# Patient Record
Sex: Male | Born: 1950 | Race: Black or African American | Hispanic: No | Smoking: Current every day smoker
Health system: Southern US, Community
[De-identification: ages and names within clinical notes are randomized; demographics above are authoritative.]

## PROBLEM LIST (undated history)

## (undated) DIAGNOSIS — R778 Other specified abnormalities of plasma proteins: Secondary | ICD-10-CM

## (undated) DIAGNOSIS — N400 Enlarged prostate without lower urinary tract symptoms: Secondary | ICD-10-CM

## (undated) DIAGNOSIS — R195 Other fecal abnormalities: Secondary | ICD-10-CM

## (undated) DIAGNOSIS — D638 Anemia in other chronic diseases classified elsewhere: Secondary | ICD-10-CM

## (undated) DIAGNOSIS — F1410 Cocaine abuse, uncomplicated: Secondary | ICD-10-CM

## (undated) DIAGNOSIS — E559 Vitamin D deficiency, unspecified: Secondary | ICD-10-CM

## (undated) DIAGNOSIS — I10 Essential (primary) hypertension: Secondary | ICD-10-CM

## (undated) DIAGNOSIS — I6340 Cerebral infarction due to embolism of unspecified cerebral artery: Secondary | ICD-10-CM

## (undated) DIAGNOSIS — N189 Chronic kidney disease, unspecified: Secondary | ICD-10-CM

## (undated) DIAGNOSIS — I609 Nontraumatic subarachnoid hemorrhage, unspecified: Secondary | ICD-10-CM

## (undated) DIAGNOSIS — R7989 Other specified abnormal findings of blood chemistry: Secondary | ICD-10-CM

## (undated) DIAGNOSIS — E876 Hypokalemia: Secondary | ICD-10-CM

## (undated) DIAGNOSIS — R1310 Dysphagia, unspecified: Secondary | ICD-10-CM

## (undated) DIAGNOSIS — I639 Cerebral infarction, unspecified: Secondary | ICD-10-CM

## (undated) DIAGNOSIS — D696 Thrombocytopenia, unspecified: Secondary | ICD-10-CM

## (undated) DIAGNOSIS — R768 Other specified abnormal immunological findings in serum: Secondary | ICD-10-CM

## (undated) DIAGNOSIS — K2270 Barrett's esophagus without dysplasia: Secondary | ICD-10-CM

## (undated) DIAGNOSIS — I6783 Posterior reversible encephalopathy syndrome: Secondary | ICD-10-CM

## (undated) HISTORY — DX: Barrett's esophagus without dysplasia: K22.70

---

## 2015-01-24 ENCOUNTER — Inpatient Hospital Stay (HOSPITAL_COMMUNITY): Payer: Medicaid Other

## 2015-01-24 ENCOUNTER — Encounter (HOSPITAL_COMMUNITY): Payer: Self-pay | Admitting: Emergency Medicine

## 2015-01-24 ENCOUNTER — Inpatient Hospital Stay (HOSPITAL_COMMUNITY)
Admission: EM | Admit: 2015-01-24 | Discharge: 2015-01-29 | DRG: 280 | Disposition: A | Discharge status: Home / Self Care | Payer: Medicaid Other | Attending: Internal Medicine | Admitting: Internal Medicine

## 2015-01-24 ENCOUNTER — Other Ambulatory Visit (HOSPITAL_COMMUNITY): Payer: Self-pay

## 2015-01-24 ENCOUNTER — Emergency Department (HOSPITAL_COMMUNITY): Payer: Medicaid Other

## 2015-01-24 DIAGNOSIS — E876 Hypokalemia: Secondary | ICD-10-CM | POA: Diagnosis present

## 2015-01-24 DIAGNOSIS — I517 Cardiomegaly: Secondary | ICD-10-CM | POA: Diagnosis present

## 2015-01-24 DIAGNOSIS — K852 Alcohol induced acute pancreatitis without necrosis or infection: Secondary | ICD-10-CM | POA: Diagnosis present

## 2015-01-24 DIAGNOSIS — R4182 Altered mental status, unspecified: Secondary | ICD-10-CM

## 2015-01-24 DIAGNOSIS — R1013 Epigastric pain: Secondary | ICD-10-CM

## 2015-01-24 DIAGNOSIS — D696 Thrombocytopenia, unspecified: Secondary | ICD-10-CM

## 2015-01-24 DIAGNOSIS — N179 Acute kidney failure, unspecified: Secondary | ICD-10-CM | POA: Diagnosis present

## 2015-01-24 DIAGNOSIS — R079 Chest pain, unspecified: Secondary | ICD-10-CM

## 2015-01-24 DIAGNOSIS — R634 Abnormal weight loss: Secondary | ICD-10-CM

## 2015-01-24 DIAGNOSIS — K920 Hematemesis: Secondary | ICD-10-CM | POA: Diagnosis not present

## 2015-01-24 DIAGNOSIS — F17200 Nicotine dependence, unspecified, uncomplicated: Secondary | ICD-10-CM

## 2015-01-24 DIAGNOSIS — N400 Enlarged prostate without lower urinary tract symptoms: Secondary | ICD-10-CM | POA: Diagnosis present

## 2015-01-24 DIAGNOSIS — F1410 Cocaine abuse, uncomplicated: Secondary | ICD-10-CM

## 2015-01-24 DIAGNOSIS — I214 Non-ST elevation (NSTEMI) myocardial infarction: Principal | ICD-10-CM | POA: Diagnosis present

## 2015-01-24 DIAGNOSIS — K5900 Constipation, unspecified: Secondary | ICD-10-CM

## 2015-01-24 DIAGNOSIS — R000 Tachycardia, unspecified: Secondary | ICD-10-CM

## 2015-01-24 DIAGNOSIS — E43 Unspecified severe protein-calorie malnutrition: Secondary | ICD-10-CM | POA: Diagnosis present

## 2015-01-24 DIAGNOSIS — F17210 Nicotine dependence, cigarettes, uncomplicated: Secondary | ICD-10-CM | POA: Diagnosis present

## 2015-01-24 DIAGNOSIS — R112 Nausea with vomiting, unspecified: Secondary | ICD-10-CM

## 2015-01-24 DIAGNOSIS — R109 Unspecified abdominal pain: Secondary | ICD-10-CM | POA: Diagnosis present

## 2015-01-24 DIAGNOSIS — Z7982 Long term (current) use of aspirin: Secondary | ICD-10-CM

## 2015-01-24 DIAGNOSIS — N184 Chronic kidney disease, stage 4 (severe): Secondary | ICD-10-CM

## 2015-01-24 DIAGNOSIS — R5383 Other fatigue: Secondary | ICD-10-CM

## 2015-01-24 DIAGNOSIS — I161 Hypertensive emergency: Secondary | ICD-10-CM | POA: Diagnosis present

## 2015-01-24 DIAGNOSIS — E559 Vitamin D deficiency, unspecified: Secondary | ICD-10-CM | POA: Diagnosis present

## 2015-01-24 DIAGNOSIS — D631 Anemia in chronic kidney disease: Secondary | ICD-10-CM | POA: Diagnosis present

## 2015-01-24 DIAGNOSIS — F1010 Alcohol abuse, uncomplicated: Secondary | ICD-10-CM | POA: Diagnosis present

## 2015-01-24 DIAGNOSIS — R51 Headache: Secondary | ICD-10-CM

## 2015-01-24 DIAGNOSIS — R55 Syncope and collapse: Secondary | ICD-10-CM | POA: Diagnosis present

## 2015-01-24 DIAGNOSIS — D6959 Other secondary thrombocytopenia: Secondary | ICD-10-CM | POA: Diagnosis present

## 2015-01-24 DIAGNOSIS — K859 Acute pancreatitis, unspecified: Secondary | ICD-10-CM | POA: Diagnosis present

## 2015-01-24 DIAGNOSIS — R7989 Other specified abnormal findings of blood chemistry: Secondary | ICD-10-CM

## 2015-01-24 DIAGNOSIS — R519 Headache, unspecified: Secondary | ICD-10-CM | POA: Diagnosis present

## 2015-01-24 DIAGNOSIS — Z720 Tobacco use: Secondary | ICD-10-CM | POA: Diagnosis present

## 2015-01-24 DIAGNOSIS — I10 Essential (primary) hypertension: Secondary | ICD-10-CM

## 2015-01-24 DIAGNOSIS — B1920 Unspecified viral hepatitis C without hepatic coma: Secondary | ICD-10-CM | POA: Diagnosis present

## 2015-01-24 DIAGNOSIS — R0602 Shortness of breath: Secondary | ICD-10-CM | POA: Insufficient documentation

## 2015-01-24 DIAGNOSIS — Z681 Body mass index (BMI) 19 or less, adult: Secondary | ICD-10-CM | POA: Diagnosis not present

## 2015-01-24 DIAGNOSIS — R195 Other fecal abnormalities: Secondary | ICD-10-CM | POA: Insufficient documentation

## 2015-01-24 DIAGNOSIS — R778 Other specified abnormalities of plasma proteins: Secondary | ICD-10-CM | POA: Diagnosis present

## 2015-01-24 DIAGNOSIS — I129 Hypertensive chronic kidney disease with stage 1 through stage 4 chronic kidney disease, or unspecified chronic kidney disease: Secondary | ICD-10-CM

## 2015-01-24 HISTORY — DX: Benign prostatic hyperplasia without lower urinary tract symptoms: N40.0

## 2015-01-24 HISTORY — DX: Cocaine abuse, uncomplicated: F14.10

## 2015-01-24 HISTORY — DX: Chronic kidney disease, unspecified: N18.9

## 2015-01-24 HISTORY — DX: Other specified abnormal findings of blood chemistry: R79.89

## 2015-01-24 HISTORY — DX: Thrombocytopenia, unspecified: D69.6

## 2015-01-24 HISTORY — DX: Other specified abnormalities of plasma proteins: R77.8

## 2015-01-24 HISTORY — DX: Hypokalemia: E87.6

## 2015-01-24 HISTORY — DX: Vitamin D deficiency, unspecified: E55.9

## 2015-01-24 HISTORY — DX: Other specified abnormal immunological findings in serum: R76.8

## 2015-01-24 HISTORY — DX: Essential (primary) hypertension: I10

## 2015-01-24 LAB — URINALYSIS, ROUTINE W REFLEX MICROSCOPIC
Bilirubin Urine: NEGATIVE
Glucose, UA: NEGATIVE mg/dL
Ketones, ur: NEGATIVE mg/dL
NITRITE: NEGATIVE
SPECIFIC GRAVITY, URINE: 1.01 (ref 1.005–1.030)
Urobilinogen, UA: 1 mg/dL (ref 0.0–1.0)
pH: 5.5 (ref 5.0–8.0)

## 2015-01-24 LAB — CBC
HEMATOCRIT: 38.9 % — AB (ref 39.0–52.0)
Hemoglobin: 13.6 g/dL (ref 13.0–17.0)
MCH: 29.5 pg (ref 26.0–34.0)
MCHC: 35 g/dL (ref 30.0–36.0)
MCV: 84.4 fL (ref 78.0–100.0)
Platelets: 148 10*3/uL — ABNORMAL LOW (ref 150–400)
RBC: 4.61 MIL/uL (ref 4.22–5.81)
RDW: 12.7 % (ref 11.5–15.5)
WBC: 6.3 10*3/uL (ref 4.0–10.5)

## 2015-01-24 LAB — BASIC METABOLIC PANEL
Anion gap: 11 (ref 5–15)
BUN: 40 mg/dL — ABNORMAL HIGH (ref 6–20)
CALCIUM: 9.2 mg/dL (ref 8.9–10.3)
CO2: 22 mmol/L (ref 22–32)
CREATININE: 3.84 mg/dL — AB (ref 0.61–1.24)
Chloride: 104 mmol/L (ref 101–111)
GFR calc Af Amer: 18 mL/min — ABNORMAL LOW (ref >60)
GFR calc non Af Amer: 15 mL/min — ABNORMAL LOW (ref >60)
Glucose, Bld: 122 mg/dL — ABNORMAL HIGH (ref 70–99)
Potassium: 4 mmol/L (ref 3.5–5.1)
Sodium: 137 mmol/L (ref 135–145)

## 2015-01-24 LAB — COMPREHENSIVE METABOLIC PANEL
ALBUMIN: 3.8 g/dL (ref 3.5–5.0)
ALK PHOS: 56 U/L (ref 38–126)
ALT: 22 U/L (ref 17–63)
AST: 29 U/L (ref 15–41)
Anion gap: 13 (ref 5–15)
BILIRUBIN TOTAL: 1.3 mg/dL — AB (ref 0.3–1.2)
BUN: 46 mg/dL — ABNORMAL HIGH (ref 6–20)
CO2: 22 mmol/L (ref 22–32)
Calcium: 9.4 mg/dL (ref 8.9–10.3)
Chloride: 100 mmol/L — ABNORMAL LOW (ref 101–111)
Creatinine, Ser: 4.17 mg/dL — ABNORMAL HIGH (ref 0.61–1.24)
GFR calc Af Amer: 16 mL/min — ABNORMAL LOW (ref >60)
GFR calc non Af Amer: 14 mL/min — ABNORMAL LOW (ref >60)
Glucose, Bld: 109 mg/dL — ABNORMAL HIGH (ref 70–99)
POTASSIUM: 3 mmol/L — AB (ref 3.5–5.1)
SODIUM: 135 mmol/L (ref 135–145)
Total Protein: 7.4 g/dL (ref 6.5–8.1)

## 2015-01-24 LAB — URINE MICROSCOPIC-ADD ON

## 2015-01-24 LAB — TROPONIN I
TROPONIN I: 1.35 ng/mL — AB (ref <0.031)
Troponin I: 0.07 ng/mL — ABNORMAL HIGH (ref <0.031)
Troponin I: 1.98 ng/mL (ref <0.031)

## 2015-01-24 LAB — CBG MONITORING, ED: Glucose-Capillary: 98 mg/dL (ref 70–99)

## 2015-01-24 LAB — RAPID URINE DRUG SCREEN, HOSP PERFORMED
AMPHETAMINES: NOT DETECTED
BARBITURATES: NOT DETECTED
Benzodiazepines: NOT DETECTED
Cocaine: POSITIVE — AB
OPIATES: NOT DETECTED
TETRAHYDROCANNABINOL: NOT DETECTED

## 2015-01-24 LAB — ETHANOL: Alcohol, Ethyl (B): <5 mg/dL (ref <5)

## 2015-01-24 LAB — SODIUM, URINE, RANDOM: Sodium, Ur: 47 mmol/L

## 2015-01-24 LAB — LIPASE, BLOOD: Lipase: 173 U/L — ABNORMAL HIGH (ref 22–51)

## 2015-01-24 LAB — PHOSPHORUS: PHOSPHORUS: 4 mg/dL (ref 2.5–4.6)

## 2015-01-24 LAB — LACTIC ACID, PLASMA: LACTIC ACID, VENOUS: 1 mmol/L (ref 0.5–2.0)

## 2015-01-24 LAB — I-STAT TROPONIN, ED: TROPONIN I, POC: 0.04 ng/mL (ref 0.00–0.08)

## 2015-01-24 LAB — PROTIME-INR
INR: 0.98 (ref 0.00–1.49)
Prothrombin Time: 13.1 seconds (ref 11.6–15.2)

## 2015-01-24 LAB — MRSA PCR SCREENING: MRSA BY PCR: NEGATIVE

## 2015-01-24 LAB — MAGNESIUM: MAGNESIUM: 2.1 mg/dL (ref 1.7–2.4)

## 2015-01-24 LAB — CREATININE, URINE, RANDOM: Creatinine, Urine: 67.24 mg/dL

## 2015-01-24 MED ORDER — ONDANSETRON HCL 4 MG/2ML IJ SOLN
4.0000 mg | Freq: Four times a day (QID) | INTRAMUSCULAR | Status: DC | PRN
  Administered 2015-01-25 – 2015-01-26 (×2): 4 mg via INTRAVENOUS
  Filled 2015-01-24 (×2): qty 2

## 2015-01-24 MED ORDER — LORAZEPAM 2 MG/ML IJ SOLN: 1.0000 mg | Freq: Four times a day (QID) | INTRAMUSCULAR | Status: AC | PRN

## 2015-01-24 MED ORDER — ASPIRIN EC 325 MG PO TBEC
325.0000 mg | DELAYED_RELEASE_TABLET | Freq: Once | ORAL | Status: AC
  Administered 2015-01-24: 325 mg via ORAL
  Filled 2015-01-24: qty 1

## 2015-01-24 MED ORDER — POTASSIUM CHLORIDE 10 MEQ/100ML IV SOLN
10.0000 meq | INTRAVENOUS | Status: DC
  Administered 2015-01-24 (×2): 10 meq via INTRAVENOUS
  Filled 2015-01-24 (×2): qty 100

## 2015-01-24 MED ORDER — ASPIRIN EC 81 MG PO TBEC: 81.0000 mg | DELAYED_RELEASE_TABLET | Freq: Every day | ORAL | Status: DC

## 2015-01-24 MED ORDER — THIAMINE HCL 100 MG/ML IJ SOLN: 100.0000 mg | Freq: Every day | INTRAMUSCULAR | Status: DC

## 2015-01-24 MED ORDER — HEPARIN (PORCINE) IN NACL 100-0.45 UNIT/ML-% IJ SOLN
500.0000 [IU]/h | INTRAMUSCULAR | Status: DC
  Administered 2015-01-24: 600 [IU]/h via INTRAVENOUS
  Filled 2015-01-24 (×2): qty 250

## 2015-01-24 MED ORDER — HEPARIN BOLUS VIA INFUSION
2500.0000 [IU] | Freq: Once | INTRAVENOUS | Status: AC
  Administered 2015-01-24: 2500 [IU] via INTRAVENOUS
  Filled 2015-01-24: qty 2500

## 2015-01-24 MED ORDER — SODIUM CHLORIDE 0.9 % IV SOLN
INTRAVENOUS | Status: DC
  Administered 2015-01-24 – 2015-01-25 (×2): via INTRAVENOUS

## 2015-01-24 MED ORDER — ADULT MULTIVITAMIN W/MINERALS CH
1.0000 | ORAL_TABLET | Freq: Every day | ORAL | Status: DC
  Administered 2015-01-24 – 2015-01-29 (×6): 1 via ORAL
  Filled 2015-01-24 (×6): qty 1

## 2015-01-24 MED ORDER — NICARDIPINE HCL IN NACL 20-0.86 MG/200ML-% IV SOLN
3.0000 mg/h | INTRAVENOUS | Status: DC
Start: 2015-01-24 — End: 2015-01-24
  Filled 2015-01-24: qty 200

## 2015-01-24 MED ORDER — ACETAMINOPHEN 325 MG PO TABS
650.0000 mg | ORAL_TABLET | Freq: Four times a day (QID) | ORAL | Status: DC | PRN
  Administered 2015-01-24 – 2015-01-26 (×2): 650 mg via ORAL
  Filled 2015-01-24 (×2): qty 2

## 2015-01-24 MED ORDER — NITROGLYCERIN IN D5W 200-5 MCG/ML-% IV SOLN
10.0000 ug/min | INTRAVENOUS | Status: DC
  Administered 2015-01-24: 10 ug/min via INTRAVENOUS
  Filled 2015-01-24: qty 250

## 2015-01-24 MED ORDER — IOHEXOL 300 MG/ML  SOLN
25.0000 mL | INTRAMUSCULAR | Status: AC
  Administered 2015-01-24: 25 mL via ORAL

## 2015-01-24 MED ORDER — MORPHINE SULFATE 2 MG/ML IJ SOLN
1.0000 mg | INTRAMUSCULAR | Status: DC | PRN
  Administered 2015-01-24 – 2015-01-25 (×2): 1 mg via INTRAVENOUS
  Filled 2015-01-24 (×2): qty 1

## 2015-01-24 MED ORDER — LORAZEPAM 1 MG PO TABS: 1.0000 mg | ORAL_TABLET | Freq: Four times a day (QID) | ORAL | Status: AC | PRN

## 2015-01-24 MED ORDER — NICOTINE 21 MG/24HR TD PT24
21.0000 mg | MEDICATED_PATCH | Freq: Every day | TRANSDERMAL | Status: DC
  Administered 2015-01-24 – 2015-01-29 (×6): 21 mg via TRANSDERMAL
  Filled 2015-01-24 (×6): qty 1

## 2015-01-24 MED ORDER — FOLIC ACID 1 MG PO TABS
1.0000 mg | ORAL_TABLET | Freq: Every day | ORAL | Status: DC
  Administered 2015-01-24 – 2015-01-29 (×6): 1 mg via ORAL
  Filled 2015-01-24 (×6): qty 1

## 2015-01-24 MED ORDER — NITROGLYCERIN 0.4 MG SL SUBL: 0.4000 mg | SUBLINGUAL_TABLET | SUBLINGUAL | Status: DC | PRN

## 2015-01-24 MED ORDER — POTASSIUM CHLORIDE CRYS ER 20 MEQ PO TBCR
40.0000 meq | EXTENDED_RELEASE_TABLET | Freq: Once | ORAL | Status: AC
  Administered 2015-01-24: 40 meq via ORAL
  Filled 2015-01-24: qty 2

## 2015-01-24 MED ORDER — CARVEDILOL 6.25 MG PO TABS
6.2500 mg | ORAL_TABLET | Freq: Two times a day (BID) | ORAL | Status: DC
  Administered 2015-01-24 – 2015-01-25 (×2): 6.25 mg via ORAL
  Filled 2015-01-24 (×2): qty 1

## 2015-01-24 MED ORDER — ASPIRIN EC 81 MG PO TBEC
81.0000 mg | DELAYED_RELEASE_TABLET | Freq: Every day | ORAL | Status: DC
  Administered 2015-01-25 – 2015-01-29 (×5): 81 mg via ORAL
  Filled 2015-01-24 (×5): qty 1

## 2015-01-24 MED ORDER — NITROGLYCERIN IN D5W 200-5 MCG/ML-% IV SOLN
0.0000 ug/min | INTRAVENOUS | Status: DC
  Administered 2015-01-24: 50 ug/min via INTRAVENOUS
  Administered 2015-01-24: 20 ug/min via INTRAVENOUS
  Administered 2015-01-24: 10 ug/min via INTRAVENOUS
  Administered 2015-01-24: 5 ug/min via INTRAVENOUS
  Administered 2015-01-24: 40 ug/min via INTRAVENOUS
  Administered 2015-01-24: 30 ug/min via INTRAVENOUS
  Administered 2015-01-24: 25 ug/min via INTRAVENOUS
  Administered 2015-01-24: 15 ug/min via INTRAVENOUS
  Administered 2015-01-25: 130 ug/min via INTRAVENOUS
  Administered 2015-01-25: 110 ug/min via INTRAVENOUS
  Administered 2015-01-25: 140 ug/min via INTRAVENOUS
  Administered 2015-01-25: 120 ug/min via INTRAVENOUS
  Administered 2015-01-25: 110 ug/min via INTRAVENOUS
  Administered 2015-01-25: 100 ug/min via INTRAVENOUS
  Administered 2015-01-25: 130 ug/min via INTRAVENOUS
  Administered 2015-01-26: 100 ug/min via INTRAVENOUS
  Filled 2015-01-24 (×6): qty 250

## 2015-01-24 MED ORDER — HEPARIN SODIUM (PORCINE) 5000 UNIT/ML IJ SOLN: 5000.0000 [IU] | Freq: Three times a day (TID) | INTRAMUSCULAR | Status: DC

## 2015-01-24 MED ORDER — VITAMIN B-1 100 MG PO TABS
100.0000 mg | ORAL_TABLET | Freq: Every day | ORAL | Status: DC
  Administered 2015-01-24 – 2015-01-29 (×6): 100 mg via ORAL
  Filled 2015-01-24 (×6): qty 1

## 2015-01-24 MED ORDER — IOHEXOL 300 MG/ML  SOLN: 25.0000 mL | INTRAMUSCULAR | Status: DC

## 2015-01-24 MED ORDER — HYDRALAZINE HCL 20 MG/ML IJ SOLN
10.0000 mg | Freq: Once | INTRAMUSCULAR | Status: AC
  Administered 2015-01-24: 10 mg via INTRAVENOUS
  Filled 2015-01-24: qty 1

## 2015-01-24 MED ORDER — ACETAMINOPHEN 325 MG PO TABS
650.0000 mg | ORAL_TABLET | Freq: Once | ORAL | Status: AC
  Administered 2015-01-24: 650 mg via ORAL
  Filled 2015-01-24: qty 2

## 2015-01-24 MED ORDER — SODIUM CHLORIDE 0.9 % IJ SOLN
3.0000 mL | Freq: Two times a day (BID) | INTRAMUSCULAR | Status: DC
  Administered 2015-01-24 – 2015-01-29 (×10): 3 mL via INTRAVENOUS

## 2015-01-24 MED ORDER — SODIUM CHLORIDE 0.9 % IV SOLN
Freq: Once | INTRAVENOUS | Status: AC
  Administered 2015-01-24: 09:00:00 via INTRAVENOUS

## 2015-01-24 MED ORDER — PANTOPRAZOLE SODIUM 40 MG IV SOLR
40.0000 mg | INTRAVENOUS | Status: DC
  Administered 2015-01-24 – 2015-01-25 (×2): 40 mg via INTRAVENOUS
  Filled 2015-01-24 (×2): qty 40

## 2015-01-24 MED ORDER — HYDRALAZINE HCL 20 MG/ML IJ SOLN
20.0000 mg | Freq: Once | INTRAMUSCULAR | Status: AC
  Administered 2015-01-24: 20 mg via INTRAVENOUS
  Filled 2015-01-24: qty 1

## 2015-01-24 NOTE — Progress Notes (Signed)
Checked on patient to evaluate his clinical status. He denies any chest pain currently. He does report a headache. BP still significantly elevated at 196/121. He appears stable overall. His second troponin came back elevated at 1.35.   Plan: - Start heparin gtt for NSTEMI - f/u 3rd troponin at 9 PM - Start Coreg 6.25 mg BID. Patient cocaine + in urine but adamantly states he last used cocaine 4 days ago. Will use nonselective agent.  - Titrate nitro gtt Q10 mins for goal systolic BP 165-175  Rich Numberarly Rivet, MD IMTS PGY-1 Pager: [phone]

## 2015-01-24 NOTE — Progress Notes (Addendum)
Patient's BP=  206/127  , after nitro drip started at 5 mcg then increased to 10 mcg.  I notified Dr. Beckie Saltsivet.  She stated that she doesn't want blood pressure to drop too quickly but ok to increase iv nitro again.  I then increased nitro to 15 mcg will continue to monitor

## 2015-01-24 NOTE — H&P (Signed)
Date: 01/24/2015               Patient Name:  Lee Swanson R Gal MRN: [id number]  DOB: Jun 08, 1951 Age / Sex: 64 y.o., male   PCP: No primary care provider on file.         Medical Service: Internal Medicine Teaching Service         Attending Physician: Dr. Burns SpainElizabeth A Butcher, MD    First Contact: Dr. Beckie Saltsivet  Pager: [phone]  Second Contact: Dr. Mikey BussingHoffman Pager: [phone]       After Hours (After 5p/  First Contact Pager: [phone]  weekends / holidays): Second Contact Pager: [phone]   Chief Complaint: Chest pain, Abdominal pain, weight loss   History of Present Illness:  64 y.o no pmh presents with multiple symptoms. He c/o left chest pain since 5 am ? if at rest/exertion per pt.  CP feels like tightening 10/10 was on the right now radiated to left side "around his heart". Pain is 10/10 associated with nausea, vomiting, sob, denies sweating.  He is unsure of what makes it worse ? If eating makes it worse, nothing makes CP betters.  Overall CP has occurred x 1-2 months though worsened since this am 5 am.  He also states he blacked out for a short period this am while trying to get up to use the bathroom.  He denies hitting his head and this was not witnessed.  He has also had intermittent sob at rest.  He also reports RUQ, epigastric 10/10 abdominal pain "tightening" sensation nothing makes worse and he has not tried any medication. Pain worsening today but has been ongoing x 1-2 months. Associated with nausea, vomiting, decreased po intake x 4 days.  He also reports 10/10 h/a that was bitemporal now frontal. H/a is throbbing and assoc. With blurry vision in left eye.    He has not seen a doctor in 40 years.  BP in the ED was 233/145. He was given Hydralazine 30 mg total and started on NTG gtt in ED around 8:06 at 10 mcg.     Meds: Current Facility-Administered Medications  Medication Dose Route Frequency Provider Last Rate Last Dose  . 0.9 %  sodium chloride infusion   Intravenous Once  Burns SpainElizabeth A Butcher, MD      . acetaminophen (TYLENOL) tablet 650 mg  650 mg Oral Q6H PRN Annett Gularacy N McLean, MD      . Melene Muller[START ON 01/25/2015] aspirin EC tablet 81 mg  81 mg Oral Daily Annett Gularacy N McLean, MD      . morphine 2 MG/ML injection 1 mg  1 mg Intravenous Q4H PRN Annett Gularacy N McLean, MD      . nitroGLYCERIN (NITROSTAT) SL tablet 0.4 mg  0.4 mg Sublingual Q5 min PRN Annett Gularacy N McLean, MD      . potassium chloride 10 mEq in 100 mL IVPB  10 mEq Intravenous Q1 Hr x 5 Annett Gularacy N McLean, MD 100 mL/hr at 01/24/15 0903 10 mEq at 01/24/15 [id number]   No current outpatient prescriptions on file.    Allergies: Allergies as of 01/24/2015  . (No Known Allergies)   Past Medical History  Diagnosis Date  . Patient denies medical problems    Past Surgical History  Procedure Laterality Date  . Denies     No family history on file. History   Social History  . Marital Status: Unknown    Spouse Name: N/A  . Number of Children: N/A  . Years of Education:  N/A   Occupational History  . Not on file.   Social History Main Topics  . Smoking status: Current Every Day Smoker -- 0.50 packs/day  . Smokeless tobacco: Not on file  . Alcohol Use: Yes  . Drug Use: No  . Sexual Activity: Not on file   Other Topics Concern  . 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 If at rest/exertion per pt Pulm: +sob @rest  Abd/GU: +RUQ, +epigastric ab pain, +n/v, +increased urination q5-10 min,  +nocturia, +weak stream, +not completely emptying bladder, denies GU blood Ext: denies lower ext edema  Neuro: +h/a, +syncope and fall w/o hitting head    Physical Exam: Blood pressure 195/117, pulse 95, temperature 97.9 F (36.6 C), temperature source Oral, resp. rate 18, height 5\' 6"  (1.676 m), SpO2 100 %.  VS pm exa, 107, 100% RA, 26, 134/92 (102) Vitals reviewed. General: resting in bed, mild distress 2/2 pain, NAD, thin/cachetic HEENT: PERRL b/l right pupil 3 mm smaller than left 4 mm, no scleral icterus, dry mucous membranes, poor dentition Cardiac: ST, no rubs, murmurs or gallops Pulm: clear to auscultation bilaterally, no wheezes, rales, or rhonchi Abd: soft, mild to mod ttp RUQ, epigastric, nondistended, BS present Ext: warm and well perfused, no pedal edema Neuro: alert and oriented X3, cranial nerves II-XII grossly intact, moving all 4 extremities  Skin: Xerosis generalized   Lab results: Basic Metabolic Panel:  Recent Labs  16/06/9604/09/16 0433 01/24/15 0812  NA 135  --   K 3.0*  --   CL 100*  --   CO2 22  --   GLUCOSE 109*  --   BUN 46*  --   CREATININE 4.17*  --   CALCIUM 9.4  --   MG  --  2.1  PHOS  --  4.0   Liver Function Tests:  Recent Labs  01/24/15 0433  AST 29  ALT 22  ALKPHOS 56  BILITOT 1.3*  PROT 7.4  ALBUMIN 3.8    Recent Labs  01/24/15 0812  LIPASE 173*   No results for input(s): AMMONIA in the last 72 hours. CBC:  Recent Labs  01/24/15 0433  WBC 6.3  HGB 13.6  HCT 38.9*  MCV 84.4  PLT 148*   Cardiac Enzymes:  Recent Labs  01/24/15 0812  TROPONINI 0.07*   CBG:  Recent Labs  01/24/15 0426  GLUCAP 98   Coagulation:  Recent Labs  01/24/15 0812  LABPROT 13.1  INR 0.98   Urine Drug Screen: Drugs of Abuse     Component Value Date/Time   LABOPIA NONE DETECTED 01/24/2015 0528   COCAINSCRNUR POSITIVE* 01/24/2015 0528   LABBENZ NONE DETECTED 01/24/2015 0528   AMPHETMU NONE DETECTED 01/24/2015 0528   THCU NONE  DETECTED 01/24/2015 0528   LABBARB NONE DETECTED 01/24/2015 0528    Alcohol Level:  Recent  Labs  01/24/15 0812  ETH <5   Urinalysis:  Recent Labs  01/24/15 0758  COLORURINE YELLOW  LABSPEC 1.010  PHURINE 5.5  GLUCOSEU NEGATIVE  HGBUR SMALL*  BILIRUBINUR NEGATIVE  KETONESUR NEGATIVE  PROTEINUR >300*  UROBILINOGEN 1.0  NITRITE NEGATIVE  LEUKOCYTESUR TRACE*   Misc. Labs: HIV antibody   Imaging results:  Ct Abdomen Pelvis Wo Contrast  01/24/2015   CLINICAL DATA:  Hypertension and shortness of breath. Frequent urination.  EXAM: CT ABDOMEN AND PELVIS WITHOUT CONTRAST  TECHNIQUE: Multidetector CT imaging of the abdomen and pelvis was performed following the standard protocol without IV contrast.  COMPARISON:  None.  FINDINGS: BODY WALL: No contributory findings.  LOWER CHEST: Proximal left circulation coronary artery atherosclerosis.  ABDOMEN/PELVIS:  Liver: No focal abnormality.  Biliary: No evidence of biliary obstruction or stone.  Pancreas: Unremarkable.  Spleen: Unremarkable.  Adrenals: Unremarkable.  Kidneys and ureters: No hydronephrosis or stone. Fetal appearing lobulation of the bilateral kidneys. An exophytic 3 cm presumed cyst noted from the upper pole left kidney.  Bladder: Mild circumferential wall thickening, accentuated by underdistention.  Reproductive: Marked symmetric enlargement of the prostate gland, 6.5 cm in transverse dimension  Bowel: No obstruction. Negative appendix. Mild colonic diverticulosis.  Retroperitoneum: No mass or adenopathy.  Peritoneum: No ascites or pneumoperitoneum.  Vascular: No acute abnormality.  OSSEOUS: No acute abnormalities. L4-5 and L5-S1 degenerative disc narrowing and spurring.  IMPRESSION: 1. No acute intra-abdominal findings. 2. Marked prostatomegaly. 3. Colonic diverticulosis. 4. Coronary atherosclerosis.   Electronically Signed   By: Marnee Spring M.D.   On: 01/24/2015 08:29   Dg Chest 2 View  01/24/2015   CLINICAL DATA:  Shortness of  Breath  EXAM: CHEST  2 VIEW  COMPARISON:  None.  FINDINGS: There is no edema or consolidation. The heart size and pulmonary vascularity are normal. No adenopathy. There is evidence of old rib trauma involving the posterior left eighth rib.  IMPRESSION: No edema or consolidation.   Electronically Signed   By: Bretta Bang III M.D.   On: 01/24/2015 07:16    Other results: EKG: NSR, nl intervals, nl axis, biatrial enlargement, TWI/flattening 2, 3, AVF, ST changes V3, V5, V6, LVH  #Hypertensive emergency -with AKI, LVH on EKG likely 2/2 uncontrolled HTN chronically. BP on admission 233/145.  Less likely AD as SBP not >20 in b/l arms.   -trend Trop x 3, pending echo  -Given Hydralazine 30 mg total in ED.  Nicardipine gtt ordered 1st hour do not lower sbp below 200 then w/in 23 more hours do not lower sbp <165. While seen in ED BP was 134/92 (102) repeat sbp 160. Will hold on Nicardipine gtt for now do not want to acutely lower BP too much. Called by RN in ED unable to take pt to SDU on Nicardipine will need to go to ICU unless NTG.  Will change to NTG gtt to slowly titrate to SBP 165-175 w/in 1st 23 hours.   -Admit to SDU on tele, neuro checks   #Chest pain/Sinus tachycardia  -r/o ACS, AD (SBP difference not >20 169/101 right arm, 186/114 left arm), also cocaine +. Associated with n/v/sob. CXR neg -trop x 3 -prn EKG for chest pain -prn NTG, prn morphine 1 q4, given Aspirin 324 x 1 continue 81 in am -prn O2 -cards consulted for recs   #AKI (acute kidney injury) on CKD (chronic kidney disease) stage 4, GFR 16 ml/min (borderline stage 5) -Cr on admission 4.17.  ? Etiology related to  uncontrolled HTN, decreased po vs enlarged prostate (BPH), cocaine use. Fena consistent with intrinsic renal pathology ATN/AIN, GN, proteinuria 300 on UA -check urine lytes, check renal function panel in am, check PTH, phos today ,vitamin D, UA, bladder scan -strict i/o, daily weights -NS 100 cc/hr  -consider renal  consult in am if kidneys not improving   #presyncope vs syncope -? If due to decrease po intake vs uncontrolled HTN, will w/u for CHF -check orthostatics (pt unable to perform standing due to weakness but lying and sitting values no orthostasis), echo pending -consider PT/OT when able   #Acute epigastric and RUQ abdominal pain/nausea/vomiting -?etiology could be gastritis vs MSK due to n/v vs pancreatitis.  Check lactic acid neg, lipase to w/u pancreatitis (173), ethanol level neg.   -CT Ab/pelvis-diverticulosis, markedly enlarged prostate -prn Zofran, start Protonix 40 mg iv   #H/a -likely 2/2 HTN emergency vs NTG gtt started in ED -Neuro checks -prn Tylenol  #Weight loss/fatigue -per pt normally weights 140-150 and lost weight over last 1-2 months, also had decreased po x 4 days.   -CT with enlarged prostate, diverticulosis -will need outpatient referral to GI for colonoscopy, check tsh am  #Thrombocytopenia -mild, ? Etiology could be due to chronic alcohol abuse -trend CBC, check PT, INR  #Cocaine abuse/Tobacco abuse/? Alcohol abuse/Lack of insurance and PCP -social work consulted. Pt is not being honest about cocaine use. He states he drinks 1-2 beers daily. Will start CIWA empirically -RN smoking cessation  -check ethanol level -Nicotine 21 patch  #constipation -monitor last stool yesterday, consider adding Miralax if po intake picks up and still no BM.  BM's are hard per patient  #Enlarged prostate  -consider addition of Flomax when able to tolerate po  #F/E/N -NS 100  -Hypokalemia-replaced with 5 runs 10 meq, check Mag -NPO will advance as tolerated     #DVT px  -hep, scds  Dispo: Disposition is deferred at this time, awaiting improvement of current medical problems. Anticipated discharge in approximately 2-4 day(s).   The patient does not have a current PCP (No primary care provider on file.) and does need to establish follow-up appointment after discharge  with PCP  The patient does have transportation limitations that hinder transportation to clinic appointments.  Signed: Annett Gula, MD 01/24/2015, 10:02 AM

## 2015-01-24 NOTE — Consult Note (Signed)
CARDIOLOGY CONSULT NOTE   Patient ID: Lee Swanson MRN: [id number] DOB/AGE: 1950-12-01 64 y.o.  Admit Date: 01/24/2015  Primary Physician: No primary care provider on file.  Primary Cardiologist     Lee Swanson   Lee Swanson   Clinical Summary Lee Swanson is a 89 y.o.male who was admitted with a long list of medical problems. These include chest pain. However he also has abdominal pain and pain that radiates to his head. He has severe renal disease. He has significant weight loss and marked fatigue. There is very slight troponin elevation. He has marked fatigue. In addition he had marked hypertension on admission.   No Known Allergies  Medications Scheduled Medications: . [START ON 01/25/2015] aspirin EC  81 mg Oral Daily  . folic acid  1 mg Oral Daily  . heparin  5,000 Units Subcutaneous 3 times per day  . multivitamin with minerals  1 tablet Oral Daily  . nicotine  21 mg Transdermal Daily  . pantoprazole (PROTONIX) IV  40 mg Intravenous Q24H  . potassium chloride  10 mEq Intravenous Q1 Hr x 5  . sodium chloride  3 mL Intravenous Q12H  . thiamine  100 mg Oral Daily   Or  . thiamine  100 mg Intravenous Daily     Infusions: . sodium chloride    . nitroGLYCERIN       PRN Medications:  acetaminophen, LORazepam **OR** LORazepam, morphine injection, nitroGLYCERIN, ondansetron (ZOFRAN) IV   Past Medical History  Diagnosis Date  . Patient denies medical problems     Past Surgical History  Procedure Laterality Date  . Denies      Family history:  There is no significant family history of coronary disease.  Social History Lee Swanson reports that he has been smoking.  He does not have any smokeless tobacco history on file. Lee Swanson reports that he drinks alcohol.  Review of Systems Complete review of systems are found to be negative unless outlined in H&P above.  Physical Examination Blood pressure 188/114, pulse 90, temperature 97.9 F (36.6 C), temperature  source Oral, resp. rate 17, height 5\' 6"  (1.676 m), SpO2 99 %.  Intake/Output Summary (Last 24 hours) at 01/24/15 1125 Last data filed at 01/24/15 0641  Gross per 24 hour  Intake      0 ml  Output    225 ml  Net   -225 ml   The patient is oriented to person time and place. Affect is normal. He looks cachectic. He says he is too fatigued to walk across the room. Head is atraumatic. There is mild jugular venous distention. Lungs reveal a few scattered rhonchi. Cardiac exam reveals S1 and S2. Abdomen is soft. There is no peripheral edema. Neurologic is grossly intact.   Prior Cardiac Testing/Procedures  Lab Results  Basic Metabolic Panel:  Recent Labs Lab 01/24/15 0433 01/24/15 0812  NA 135  --   K 3.0*  --   CL 100*  --   CO2 22  --   GLUCOSE 109*  --   BUN 46*  --   CREATININE 4.17*  --   CALCIUM 9.4  --   MG  --  2.1  PHOS  --  4.0    Liver Function Tests:  Recent Labs Lab 01/24/15 0433  AST 29  ALT 22  ALKPHOS 56  BILITOT 1.3*  PROT 7.4  ALBUMIN 3.8    CBC:  Recent Labs Lab 01/24/15 0433  WBC 6.3  HGB 13.6  HCT 38.9*  MCV 84.4  PLT 148*    Cardiac Enzymes:  Recent Labs Lab 01/24/15 0812  TROPONINI 0.07*    BNP: Invalid input(s): POCBNP   Radiology: Ct Abdomen Pelvis Wo Contrast  01/24/2015   CLINICAL DATA:  Hypertension and shortness of breath. Frequent urination.  EXAM: CT ABDOMEN AND PELVIS WITHOUT CONTRAST  TECHNIQUE: Multidetector CT imaging of the abdomen and pelvis was performed following the standard protocol without IV contrast.  COMPARISON:  None.  FINDINGS: BODY WALL: No contributory findings.  LOWER CHEST: Proximal left circulation coronary artery atherosclerosis.  ABDOMEN/PELVIS:  Liver: No focal abnormality.  Biliary: No evidence of biliary obstruction or stone.  Pancreas: Unremarkable.  Spleen: Unremarkable.  Adrenals: Unremarkable.  Kidneys and ureters: No hydronephrosis or stone. Fetal appearing lobulation of the bilateral  kidneys. An exophytic 3 cm presumed cyst noted from the upper pole left kidney.  Bladder: Mild circumferential wall thickening, accentuated by underdistention.  Reproductive: Marked symmetric enlargement of the prostate gland, 6.5 cm in transverse dimension  Bowel: No obstruction. Negative appendix. Mild colonic diverticulosis.  Retroperitoneum: No mass or adenopathy.  Peritoneum: No ascites or pneumoperitoneum.  Vascular: No acute abnormality.  OSSEOUS: No acute abnormalities. L4-5 and L5-S1 degenerative disc narrowing and spurring.  IMPRESSION: 1. No acute intra-abdominal findings. 2. Marked prostatomegaly. 3. Colonic diverticulosis. 4. Coronary atherosclerosis.   Electronically Signed   By: Marnee Spring M.D.   On: 01/24/2015 08:29   Dg Chest 2 View  01/24/2015   CLINICAL DATA:  Shortness of Breath  EXAM: CHEST  2 VIEW  COMPARISON:  None.  FINDINGS: There is no edema or consolidation. The heart size and pulmonary vascularity are normal. No adenopathy. There is evidence of old rib trauma involving the posterior left eighth rib.  IMPRESSION: No edema or consolidation.   Electronically Signed   By: Bretta Bang III M.D.   On: 01/24/2015 07:16     ECG:  I have reviewed the current EKGs. There are no old EKGs for comparison. There is increased voltage and ST changes consistent with LVH. One of the EKGs shows more marked ST depression. This is concerning for the possibility of ischemia.   Impression and Recommendations    Hypertensive emergency    Patient is receiving treatment in the hospital for his hypertension.    Hypokalemia     Potassium will need to be treated carefully.    AKI (acute kidney injury)     Patient has severe renal dysfunction. This may be due to hypertensive cardiovascular disease. This will limit his overall evaluation relative to his cardiac status.   CKD (chronic kidney disease) stage 4, GFR 15-29 ml/min    Thrombocytopenia    Cocaine abuse     Cocaine may be  playing a role with his chest pain and the probability of coronary disease. All attempts 8 to be made to stop his cocaine use.    LVH (left ventricular hypertrophy)    Abdominal pain   Nausea and vomiting   Weight loss     At this point, etiology of his abdominal symptoms is not clear. This will be evaluated by the primary team.        Tobacco abuse    Chest pain   Syncope   Elevated troponin      At this point it is very difficult to assess his chest pain. He tells me that he has pain that starts in his abdomen and radiates up through his chest to the top  of his head. There is very slight troponin elevation. He has severe renal insufficiency. At this time I would plan to try to assess all of his other medical problems following his cardiac status at the same time. 2-D echo will be helpful to assess LV function. If he has LV dysfunction, we will have to adjust his meds very carefully because of his renal insufficiency. He is not a candidate at this time for cardiac catheterization.  Jerral BonitoJeff Katz, MD 01/24/2015, 11:25 AM

## 2015-01-24 NOTE — ED Notes (Signed)
Phlebotomy at bedside.

## 2015-01-24 NOTE — Progress Notes (Addendum)
Patient's BP still elevated after nitro increased to 2920mcg/min I notified Dr. Beckie Saltsivet. I asked for prn medications to treat hypertension,  she stated she wanted to just continue going up on the IV nitro slowly every hour.  Will continue to monitor.

## 2015-01-24 NOTE — ED Notes (Signed)
Preformed orthostatic vital signs lying down and sitting up. Pt expressed that he could not stand up due to feeling too weak.

## 2015-01-24 NOTE — ED Notes (Signed)
Pt arrives via EMS with intermittent SHOB ongoing for the last 3 days, near syncope episode tonight. Fatigue, dry mouth, nausea, frequent urination, weight loss ongoing for a month. Hasn't seen a doctor in 40 years. Alert and oriented.

## 2015-01-24 NOTE — Progress Notes (Signed)
ANTICOAGULATION CONSULT NOTE - Initial Consult  Pharmacy Consult for Heparin Indication: chest pain/ACS  No Known Allergies  Patient Measurements: Height: 5\' 6"  (167.6 cm) Weight: 107 lb 9.6 oz (48.807 kg) IBW/kg (Calculated) : 63.8 Heparin Dosing Weight:   Vital Signs: BP: 196/121 mmHg (05/09 1840) Pulse Rate: 85 (05/09 1840)  Labs:  Recent Labs  01/24/15 0433 01/24/15 0812 01/24/15 1508  HGB 13.6  --   --   HCT 38.9*  --   --   PLT 148*  --   --   LABPROT  --  13.1  --   INR  --  0.98  --   CREATININE 4.17*  --   --   TROPONINI  --  0.07* 1.35*    Estimated Creatinine Clearance: 12.4 mL/min (by C-G formula based on Cr of 4.17).   Medical History: Past Medical History  Diagnosis Date  . Patient denies medical problems     Medications:  Scheduled:  . [START ON 01/25/2015] aspirin EC  81 mg Oral Daily  . carvedilol  6.25 mg Oral BID WC  . folic acid  1 mg Oral Daily  . heparin  5,000 Units Subcutaneous 3 times per day  . multivitamin with minerals  1 tablet Oral Daily  . nicotine  21 mg Transdermal Daily  . pantoprazole (PROTONIX) IV  40 mg Intravenous Q24H  . sodium chloride  3 mL Intravenous Q12H  . thiamine  100 mg Oral Daily   Or  . thiamine  100 mg Intravenous Daily    Assessment: 64yo male admitted with chest pain, N/V, SOB and hypertensive emergency.  He is to start Heparin for NSTEMI.  He has not seen a physician in 40y and is on no medications at home.  He is found to be CKD-4.  He is currently on Nitroglycerin drip and received Hydralazine IV x 2.  UDS (+)Cocaine Cr 4.17, LFTs wnl, Hg 13.6, pltc 148, INR 0.98  Goal of Therapy:  Heparin level 0.3-0.7 units/ml Monitor platelets by anticoagulation protocol: Yes   Plan:  Heparin 2500 units IV x 1, then 600 units/hr Check HL 8hr Daily HL, CBC Watch for s/s of bleeding.  Marisue HumbleKendra Keoshia Steinmetz, PharmD Clinical Pharmacist [facility]

## 2015-01-24 NOTE — ED Provider Notes (Signed)
CSN: [id number]     Arrival date & time 01/24/15  0404 History   First MD Initiated Contact with Patient 01/24/15 0411     Chief Complaint  Patient presents with  . Hypertension  . Shortness of Breath     (Consider location/radiation/quality/duration/timing/severity/associated sxs/prior Treatment) Patient is a 64 y.o. male presenting with vomiting.  Emesis Severity:  Moderate Duration:  4 days Timing:  Constant Quality:  Stomach contents Progression:  Worsening Chronicity:  New Relieved by:  Nothing Worsened by:  Nothing tried Ineffective treatments:  None tried Associated symptoms: abdominal pain and chills     History reviewed. No pertinent past medical history. History reviewed. No pertinent past surgical history. No family history on file. History  Substance Use Topics  . Smoking status: Current Every Day Smoker -- 0.50 packs/day  . Smokeless tobacco: Not on file  . Alcohol Use: Yes    Review of Systems  Constitutional: Positive for chills.  Gastrointestinal: Positive for vomiting and abdominal pain.  All other systems reviewed and are negative.     Allergies  Review of patient's allergies indicates no known allergies.  Home Medications   Prior to Admission medications   Not on File   BP 218/134 mmHg  Pulse 92  Temp(Src) 97.9 F (36.6 C) (Oral)  Resp 17  Ht _0  (1.676 m)  SpO2 100% Physical Exam  Constitutional: He is oriented to person, place, and time. He appears well-developed and well-nourished.  HENT:  Head: Normocephalic and atraumatic.  Eyes: Conjunctivae and EOM are normal.  Neck: Normal range of motion. Neck supple.  Cardiovascular: Normal rate, regular rhythm and normal heart sounds.   Pulmonary/Chest: Effort normal and breath sounds normal. No respiratory distress.  Abdominal: He exhibits no distension. There is generalized tenderness (mild). There is no rebound and no guarding.  Musculoskeletal: Normal range of motion.   Neurological: He is alert and oriented to person, place, and time.  Skin: Skin is warm and dry.  Vitals reviewed.   ED Course  Procedures (including critical care time) Labs Review Labs Reviewed  CBC - Abnormal; Notable for the following:    HCT 38.9 (*)    Platelets 148 (*)    All other components within normal limits  COMPREHENSIVE METABOLIC PANEL - Abnormal; Notable for the following:    Potassium 3.0 (*)    Chloride 100 (*)    Glucose, Bld 109 (*)    BUN 46 (*)    Creatinine, Ser 4.17 (*)    Total Bilirubin 1.3 (*)    GFR calc non Af Amer 14 (*)    GFR calc Af Amer 16 (*)    All other components within normal limits  URINE RAPID DRUG SCREEN (HOSP PERFORMED) - Abnormal; Notable for the following:    Cocaine POSITIVE (*)    All other components within normal limits  I-STAT TROPOININ, ED  CBG MONITORING, ED    Imaging Review Dg Chest 2 View  01/24/2015   CLINICAL DATA:  Shortness of Breath  EXAM: CHEST  2 VIEW  COMPARISON:  None.  FINDINGS: There is no edema or consolidation. The heart size and pulmonary vascularity are normal. No adenopathy. There is evidence of old rib trauma involving the posterior left eighth rib.  IMPRESSION: No edema or consolidation.   Electronically Signed   By: Lowella Grip III M.D.   On: 01/24/2015 07:16     EKG Interpretation   Date/Time:  Monday Jan 24 2015 04:12:45 EDT Ventricular Rate:  94 PR Interval:  149 QRS Duration: 94 QT Interval:  359 QTC Calculation: 449 R Axis:   49 Text Interpretation:  Sinus rhythm Biatrial enlargement LVH with secondary  repolarization abnormality No old tracing to compare Confirmed by Debby Freiberg [phone]) on 01/24/2015 7:19:47 AM     CRITICAL CARE Performed by: Debby Freiberg   Total critical care time: 45 min  Critical care time was exclusive of separately billable procedures and treating other patients.  Critical care was necessary to treat or prevent imminent or life-threatening  deterioration.  Critical care was time spent personally by me on the following activities: development of treatment plan with patient and/or surrogate as well as nursing, discussions with consultants, evaluation of patient's response to treatment, examination of patient, obtaining history from patient or surrogate, ordering and performing treatments and interventions, ordering and review of laboratory studies, ordering and review of radiographic studies, pulse oximetry and re-evaluation of patient's condition.  MDM   Final diagnoses:  Abdominal pain, acute    64 y.o. male without pertinent PMH presents with a myriad of symptoms, primarily 4 days of nausea, vomiting, near syncope, weight loss.  On arrival vitals and physical exam as above.  Wu with renal injury, unknown chronicity as pt has not seen a physician in 73 years.  UDS positive for cocaine.  CXR unremarkable.  Given hydralazine x 2 with mild improvement in BP.  Admitted for hypertensive emergency.  I have reviewed all laboratory and imaging studies if ordered as above  1. Abdominal pain, acute         Debby Freiberg, MD 01/24/15 [phone]

## 2015-01-25 ENCOUNTER — Inpatient Hospital Stay (HOSPITAL_COMMUNITY): Payer: Medicaid Other

## 2015-01-25 ENCOUNTER — Encounter (HOSPITAL_COMMUNITY): Payer: Self-pay | Admitting: General Practice

## 2015-01-25 DIAGNOSIS — R0789 Other chest pain: Secondary | ICD-10-CM

## 2015-01-25 DIAGNOSIS — R768 Other specified abnormal immunological findings in serum: Secondary | ICD-10-CM

## 2015-01-25 DIAGNOSIS — F17210 Nicotine dependence, cigarettes, uncomplicated: Secondary | ICD-10-CM

## 2015-01-25 DIAGNOSIS — K859 Acute pancreatitis, unspecified: Secondary | ICD-10-CM

## 2015-01-25 DIAGNOSIS — E43 Unspecified severe protein-calorie malnutrition: Secondary | ICD-10-CM | POA: Insufficient documentation

## 2015-01-25 DIAGNOSIS — H538 Other visual disturbances: Secondary | ICD-10-CM

## 2015-01-25 DIAGNOSIS — I517 Cardiomegaly: Secondary | ICD-10-CM

## 2015-01-25 HISTORY — DX: Other specified abnormal immunological findings in serum: R76.8

## 2015-01-25 LAB — CBC
HEMATOCRIT: 31.1 % — AB (ref 39.0–52.0)
HEMATOCRIT: 28.9 % — AB (ref 39.0–52.0)
HEMOGLOBIN: 10.1 g/dL — AB (ref 13.0–17.0)
Hemoglobin: 10.8 g/dL — ABNORMAL LOW (ref 13.0–17.0)
MCH: 29.4 pg (ref 26.0–34.0)
MCH: 29.4 pg (ref 26.0–34.0)
MCHC: 34.7 g/dL (ref 30.0–36.0)
MCHC: 34.9 g/dL (ref 30.0–36.0)
MCV: 84.7 fL (ref 78.0–100.0)
MCV: 84.3 fL (ref 78.0–100.0)
Platelets: 128 10*3/uL — ABNORMAL LOW (ref 150–400)
Platelets: 139 10*3/uL — ABNORMAL LOW (ref 150–400)
RBC: 3.67 MIL/uL — AB (ref 4.22–5.81)
RBC: 3.43 MIL/uL — ABNORMAL LOW (ref 4.22–5.81)
RDW: 12.8 % (ref 11.5–15.5)
RDW: 12.9 % (ref 11.5–15.5)
WBC: 6.1 10*3/uL (ref 4.0–10.5)
WBC: 7.2 10*3/uL (ref 4.0–10.5)

## 2015-01-25 LAB — LIPID PANEL
CHOLESTEROL: 148 mg/dL (ref 0–200)
HDL: 62 mg/dL (ref >40)
LDL CALC: 76 mg/dL (ref 0–99)
Total CHOL/HDL Ratio: 2.4 RATIO
Triglycerides: 49 mg/dL (ref <150)
VLDL: 10 mg/dL (ref 0–40)

## 2015-01-25 LAB — RENAL FUNCTION PANEL
ALBUMIN: 3.1 g/dL — AB (ref 3.5–5.0)
Anion gap: 7 (ref 5–15)
BUN: 40 mg/dL — AB (ref 6–20)
CO2: 21 mmol/L — ABNORMAL LOW (ref 22–32)
CREATININE: 3.72 mg/dL — AB (ref 0.61–1.24)
Calcium: 8.9 mg/dL (ref 8.9–10.3)
Chloride: 108 mmol/L (ref 101–111)
GFR calc Af Amer: 18 mL/min — ABNORMAL LOW (ref >60)
GFR calc non Af Amer: 16 mL/min — ABNORMAL LOW (ref >60)
GLUCOSE: 102 mg/dL — AB (ref 70–99)
POTASSIUM: 4.3 mmol/L (ref 3.5–5.1)
Phosphorus: 5 mg/dL — ABNORMAL HIGH (ref 2.5–4.6)
Sodium: 136 mmol/L (ref 135–145)

## 2015-01-25 LAB — HEPARIN LEVEL (UNFRACTIONATED)
HEPARIN UNFRACTIONATED: 0.6 [IU]/mL (ref 0.30–0.70)
Heparin Unfractionated: 0.32 IU/mL (ref 0.30–0.70)
Heparin Unfractionated: 0.59 IU/mL (ref 0.30–0.70)

## 2015-01-25 LAB — ABO/RH: ABO/RH(D): B POS

## 2015-01-25 LAB — TYPE AND SCREEN
ABO/RH(D): B POS
Antibody Screen: NEGATIVE

## 2015-01-25 LAB — TROPONIN I: Troponin I: 2.87 ng/mL (ref <0.031)

## 2015-01-25 LAB — TSH: TSH: 1.395 u[IU]/mL (ref 0.350–4.500)

## 2015-01-25 LAB — HIV ANTIBODY (ROUTINE TESTING W REFLEX): HIV SCREEN 4TH GENERATION: NONREACTIVE

## 2015-01-25 LAB — OCCULT BLOOD X 1 CARD TO LAB, STOOL: FECAL OCCULT BLD: POSITIVE — AB

## 2015-01-25 LAB — VITAMIN D 25 HYDROXY (VIT D DEFICIENCY, FRACTURES): Vit D, 25-Hydroxy: 13 ng/mL — ABNORMAL LOW (ref 30.0–100.0)

## 2015-01-25 MED ORDER — PANTOPRAZOLE SODIUM 40 MG IV SOLR
40.0000 mg | Freq: Two times a day (BID) | INTRAVENOUS | Status: DC
  Administered 2015-01-26 (×2): 40 mg via INTRAVENOUS
  Filled 2015-01-25 (×2): qty 40

## 2015-01-25 MED ORDER — BOOST / RESOURCE BREEZE PO LIQD
1.0000 | Freq: Three times a day (TID) | ORAL | Status: DC
  Administered 2015-01-25 – 2015-01-29 (×8): 1 via ORAL

## 2015-01-25 MED ORDER — AMLODIPINE BESYLATE 5 MG PO TABS
5.0000 mg | ORAL_TABLET | Freq: Every day | ORAL | Status: DC
  Administered 2015-01-25: 5 mg via ORAL
  Filled 2015-01-25: qty 1

## 2015-01-25 MED ORDER — VERAPAMIL HCL 80 MG PO TABS
80.0000 mg | ORAL_TABLET | Freq: Three times a day (TID) | ORAL | Status: DC
  Filled 2015-01-25: qty 1

## 2015-01-25 MED ORDER — CARVEDILOL 12.5 MG PO TABS
12.5000 mg | ORAL_TABLET | Freq: Two times a day (BID) | ORAL | Status: DC
  Administered 2015-01-25 – 2015-01-26 (×2): 12.5 mg via ORAL
  Filled 2015-01-25 (×3): qty 1

## 2015-01-25 MED ORDER — PROMETHAZINE HCL 25 MG/ML IJ SOLN
12.5000 mg | Freq: Once | INTRAMUSCULAR | Status: AC
  Administered 2015-01-25: 12.5 mg via INTRAVENOUS
  Filled 2015-01-25: qty 1

## 2015-01-25 MED ORDER — VERAPAMIL HCL 80 MG PO TABS
80.0000 mg | ORAL_TABLET | Freq: Three times a day (TID) | ORAL | Status: DC
  Administered 2015-01-25 – 2015-01-27 (×5): 80 mg via ORAL
  Filled 2015-01-25 (×9): qty 1

## 2015-01-25 MED ORDER — VITAMIN D (ERGOCALCIFEROL) 1.25 MG (50000 UNIT) PO CAPS
50000.0000 [IU] | ORAL_CAPSULE | ORAL | Status: DC
  Administered 2015-01-26: 50000 [IU] via ORAL
  Filled 2015-01-25: qty 1

## 2015-01-25 MED ORDER — HYDROMORPHONE HCL 1 MG/ML IJ SOLN: 0.5000 mg | INTRAMUSCULAR | Status: DC | PRN

## 2015-01-25 NOTE — Progress Notes (Signed)
Subjective:  No more chest pain. Would like some ice cream.  Objective:  Vital Signs in the last 24 hours: Temp:  [97.2 F (36.2 C)-98.1 F (36.7 C)] 97.6 F (36.4 C) (05/10 0801) Pulse Rate:  [58-107] 69 (05/10 0801) Resp:  [11-24] 16 (05/10 0801) BP: (160-223)/(99-156) 201/107 mmHg (05/10 0801) SpO2:  [90 %-100 %] 99 % (05/10 0801) Weight:  [107 lb 9.6 oz (48.807 kg)-109 lb 4.8 oz (49.578 kg)] 109 lb 4.8 oz (49.578 kg) (05/10 0334)  Intake/Output from previous day: 05/09 0701 - 05/10 0700 In: 1898.6 [I.V.:1898.6] Out: 1050 [Urine:1050]   Physical Exam: General: Thin, in no acute distress. Head:  Normocephalic and atraumatic. Lungs: Minimal scattered rhonchi. Heart: Normal S1 and S2.  No murmur, rubs or gallops.  Abdomen: soft, non-tender, positive bowel sounds. Extremities: No clubbing or cyanosis. No edema. Neurologic: Alert and oriented x 3.    Lab Results:  Recent Labs  01/24/15 0433 01/25/15 0346  WBC 6.3 6.1  HGB 13.6 10.8*  PLT 148* 128*    Recent Labs  01/24/15 1930 01/25/15 0346  NA 137 136  K 4.0 4.3  CL 104 108  CO2 22 21*  GLUCOSE 122* 102*  BUN 40* 40*  CREATININE 3.84* 3.72*    Recent Labs  01/24/15 1930 01/25/15 0346  TROPONINI 1.98* 2.87*   Hepatic Function Panel  Recent Labs  01/24/15 0433 01/25/15 0346  PROT 7.4  --   ALBUMIN 3.8 3.1*  AST 29  --   ALT 22  --   ALKPHOS 56  --   BILITOT 1.3*  --     Recent Labs  01/25/15 0346  CHOL 148   No results for input(s): PROTIME in the last 72 hours.  Imaging: Ct Abdomen Pelvis Wo Contrast  01/24/2015   CLINICAL DATA:  Hypertension and shortness of breath. Frequent urination.  EXAM: CT ABDOMEN AND PELVIS WITHOUT CONTRAST  TECHNIQUE: Multidetector CT imaging of the abdomen and pelvis was performed following the standard protocol without IV contrast.  COMPARISON:  None.  FINDINGS: BODY WALL: No contributory findings.  LOWER CHEST: Proximal left circulation coronary artery  atherosclerosis.  ABDOMEN/PELVIS:  Liver: No focal abnormality.  Biliary: No evidence of biliary obstruction or stone.  Pancreas: Unremarkable.  Spleen: Unremarkable.  Adrenals: Unremarkable.  Kidneys and ureters: No hydronephrosis or stone. Fetal appearing lobulation of the bilateral kidneys. An exophytic 3 cm presumed cyst noted from the upper pole left kidney.  Bladder: Mild circumferential wall thickening, accentuated by underdistention.  Reproductive: Marked symmetric enlargement of the prostate gland, 6.5 cm in transverse dimension  Bowel: No obstruction. Negative appendix. Mild colonic diverticulosis.  Retroperitoneum: No mass or adenopathy.  Peritoneum: No ascites or pneumoperitoneum.  Vascular: No acute abnormality.  OSSEOUS: No acute abnormalities. L4-5 and L5-S1 degenerative disc narrowing and spurring.  IMPRESSION: 1. No acute intra-abdominal findings. 2. Marked prostatomegaly. 3. Colonic diverticulosis. 4. Coronary atherosclerosis.   Electronically Signed   By: Marnee SpringJonathon  Watts M.D.   On: 01/24/2015 08:29   Dg Chest 2 View  01/24/2015   CLINICAL DATA:  Shortness of Breath  EXAM: CHEST  2 VIEW  COMPARISON:  None.  FINDINGS: There is no edema or consolidation. The heart size and pulmonary vascularity are normal. No adenopathy. There is evidence of old rib trauma involving the posterior left eighth rib.  IMPRESSION: No edema or consolidation.   Electronically Signed   By: Bretta BangWilliam  Woodruff III M.D.   On: 01/24/2015 07:16   [place] Renal  01/24/2015   CLINICAL DATA:  Acute renal failure  EXAM: RENAL / URINARY TRACT ULTRASOUND COMPLETE  COMPARISON:  None.  FINDINGS: Right Kidney:  Length: 9.4 cm. Appears echogenic with a lobular contour. No mass or hydronephrosis noted.  Left Kidney:  Length: 9.7 cm. Increased parenchymal echogenicity. Cyst within the upper pole measures 3.3 x 3.4 x 3.5 cm.  Bladder:  Appears normal for degree of bladder distention.  IMPRESSION: 1. Bilateral echogenic kidneys compatible with  chronic medical renal disease. 2. Left renal cyst.   Electronically Signed   By: Signa Kellaylor  Stroud M.D.   On: 01/24/2015 20:57    Telemetry: No adverse rhythms Personally viewed.   EKG:  SR, LVH, ST depression diffuse  Cardiac Studies:  ECHO P  Scheduled Meds: . aspirin EC  81 mg Oral Daily  . carvedilol  6.25 mg Oral BID WC  . folic acid  1 mg Oral Daily  . multivitamin with minerals  1 tablet Oral Daily  . nicotine  21 mg Transdermal Daily  . pantoprazole (PROTONIX) IV  40 mg Intravenous Q24H  . sodium chloride  3 mL Intravenous Q12H  . thiamine  100 mg Oral Daily   Continuous Infusions: . sodium chloride 75 mL/hr at 01/24/15 1422  . heparin 600 Units/hr (01/24/15 1956)  . nitroGLYCERIN 140 mcg/min (01/25/15 0705)   PRN Meds:.acetaminophen, LORazepam **OR** LORazepam, morphine injection, nitroGLYCERIN, ondansetron (ZOFRAN) IV  Assessment/Plan:  Principal Problem:   Hypertensive emergency Active Problems:   Hypokalemia   AKI (acute kidney injury)   CKD (chronic kidney disease) stage 4, GFR 15-29 ml/min   Thrombocytopenia   Cocaine abuse   LVH (left ventricular hypertrophy)   Abdominal pain   Nausea and vomiting   Weight loss   Tobacco abuse   Chest pain   Headache   Syncope   Elevated troponin   Enlarged prostate   1. Elevated troponin  - likely demand ischemia in the setting of severe hypertension  - could certainly have underlying CAD given risk factors (ST depression on ECG)  - at this time would focus on medical mgt - HTN control, and cocaine cessation. He is at high risk for sudden cardiac death with cocaine abuse.  - Consider continue Heparin IV over next 24 hours. Try to continue to decrease BP to reduce bleeding risk. Not cardiac cath candidate at this time.  2. Hypertensive emergency  - NTG drip is maxed  - Coreg is 6.25 BID. Recommend increase to 12.5 BID  - Recommend starting amlodipine  - Recommend starting hydralazine  - No ACE-I because of renal  function  - Renal u/s reviewed. Medical renal dz  - CT abd no abnormalities  - Heart size normal on CXR, await ECHO  3. Cocaine abuse  - has tried to quit in the past  - continue to encourage  4. CKD 4 - likely HTN related  Ilir Mahrt 01/25/2015, 8:52 AM

## 2015-01-25 NOTE — Consult Note (Signed)
[facility] Consult: 11:18 AM 01/26/2015  LOS: 2 days    Referring Provider: Dr Rogelia BogaButcher  Primary Care Physician:  None Primary Gastroenterologist:  Delena BaliUn asssigned     Reason for Consultation:  Scant hematemesis. Dysphagia. Weight loss.   HPI: Lee Swanson is a 64 y.o. male.  He has had no previous medical care.  Not taking any regular over-the-counter medications.  Smokes a pack of cigarettes daily.  ETOH abuse and polysubstance abuse.   He was admitted through the ED complaining of left chest pain, nausea, vomiting, dyspnea.  The chest pain, she describes as a tightness radiating around to the left chest,  has been occurring for 1-2 months. It is not typically exertional.  Patient has intermittent dyspnea at rest.  Short period where he blacked out while getting up from using the commode, fell and hit his head.  He was complaining of headache and blurry left vision In the ED his blood pressure was 233/145 and he was treated with hydralazine and started on nitroglycerin drip.  troponins elevated and attributed to demand ischemia due to cocaine and hypertension.  Echo planned. No cardiac cath planned.  Other new diagnoses include stage 4 CKD. A new dx of Hep C positive.    Patient admits to drinking 80oz (2 forties) beer daily. [place] is from [place] but moved to [place] and lives in a rooming house.  No living close relatives.  He works temp jobs which involve heavy lifting.  Tox screen is positive for cocaine, he admits to smoking crack.  Relays weight loss but can't tell me how much.   However, he's notched his belt in probably 6 inches in the last several months. No abdominal pain. For a long time, patient is vague and can't give me exact numbers, his stools have been small balls but there is no blood  in the stool.  He denies melena and blood per rectum at home.Marland Kitchen.  His stool is FOBT positive and received reported yesterday as dark  He describes dysphagia and either vomiting or regurgitation for at least a few months. Not really having nausea. Yesterday he spit up mostly clear material with small streaks of blood. The pH of this material was tested and was not ascitic, thus it was thought that this was sputum rather than gastric contents that he spit up.  Approximately 2 PM yesterday he had a single bowel movement, described as small, loose, black.   Past Medical History  Diagnosis Date  . Patient denies medical problems   . Chronic kidney disease     ckd  . Hypokalemia   . Thrombocytopenia   . Hypertension   . Hepatitis C antibody test positive     Past Surgical History  Procedure Laterality Date  . Denies      Prior to Admission medications   Not on File    Scheduled Meds: . aspirin EC  81 mg Oral Daily  . carvedilol  12.5 mg Oral BID WC  . feeding supplement (RESOURCE BREEZE)  1 Container Oral TID  BM  . folic acid  1 mg Oral Daily  . hydrALAZINE  25 mg Oral 3 times per day  . multivitamin with minerals  1 tablet Oral Daily  . nicotine  21 mg Transdermal Daily  . pantoprazole (PROTONIX) IV  40 mg Intravenous Q12H  . sodium chloride  3 mL Intravenous Q12H  . thiamine  100 mg Oral Daily  . verapamil  80 mg Oral 3 times per day  . Vitamin D (Ergocalciferol)  50,000 Units Oral Q7 days   Infusions: . sodium chloride 75 mL/hr at 01/25/15 1458  . nitroGLYCERIN 50 mcg/min (01/26/15 0713)   PRN Meds: acetaminophen, HYDROmorphone (DILAUDID) injection, LORazepam **OR** LORazepam, nitroGLYCERIN   Allergies as of 01/24/2015  . (No Known Allergies)    History reviewed. No pertinent family history.  History   Social History  . Marital Status: Unknown    Spouse Name: N/A  . Number of Children: N/A  . Years of Education: N/A   Occupational History  . Not on file.    Social History Main Topics  . Smoking status: Current Every Day Smoker -- 0.50 packs/day    Types: Cigarettes  . Smokeless tobacco: Never Used  . Alcohol Use: Yes  . Drug Use: Yes    Special: Cocaine  . Sexual Activity: Not on file   Other Topics Concern  . 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  1.5 PPD smoker CV:  No palpitations, no LE edema.  GU:  Positive for weak stream, nocturia and incomplete emptying of the bladder. Positive for frequency. GI:  Per HPI MS:  Complains of some swelling and pain in his left ankle, foot. 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 This will was not a result of trauma. He never got it investigated. No headaches, no peripheral tingling or numbness Derm:  No itching, no rash or sores.  Endocrine:  No sweats or chills.  No polyuria or dysuria Immunization:  None.   Travel:  None beyond local counties in last few months.    PHYSICAL EXAM: Vital signs in last 24 hours: Filed Vitals:   01/26/15 0730  BP: 145/89  Pulse: 66  Temp: 98.8 F (37.1 C)  Resp: 17   Wt Readings from Last 3 Encounters:  01/26/15 112 lb 10.5 oz (51.1 kg)    General: Thin, not acutely ill appearing AAM. He is comfortable. Head:  No signs of head trauma, no facial edema. No facial asymmetry.  Eyes:  Scleral icterus, no conjunctival pallor. Ears:   No gross hearing deficit.  Nose:  No congestion, no discharge. Mouth:  Poor dentition. Moist,  clear oral mucosa. Neck:   No JVD, no TMG, no masses. Lungs:  CTA bil.  No cough, no dyspnea. Heart:RRR. No MRG. S1-S2 audible. Abdomen: Thin. No masses.  No bruits or hernias.  NT, ND.  No HSM.Marland Kitchen   Rectal: Deferred. Extremity:  No CCE.  Neurologic:  Alert. Oriented x 3.  Moves all 4 limbs, no limb weakness.  No gross neurologic deficits Dem  no rash, no sores.  Tattoos:  None seen. Nodes: no cervical adenopathy.  Psych: slightly agitated,  cooperative.   Intake/Output from previous day: 05/10 0701 - 05/11 0700 In: -  Out: 1251 [Urine:1250; Stool:1] Intake/Output this shift: Total I/O In: 360 [P.O.:360] Out: 100 [Urine:100]  LAB RESULTS:  Recent Labs  01/25/15 0346 01/25/15 1705 01/26/15 0303  WBC 6.1 7.2 7.8  HGB 10.8* 10.1* 9.5*  HCT 31.1* 28.9* 27.9*  PLT 128* 139* 133*   BMET Lab Results  Component Value Date   NA 136 01/25/2015   NA 137 01/24/2015   NA 135 01/24/2015   K 4.3 01/25/2015   K 4.0 01/24/2015   K 3.0* 01/24/2015   CL 108 01/25/2015   CL 104 01/24/2015   CL 100* 01/24/2015   CO2 21* 01/25/2015   CO2 22 01/24/2015   CO2 22 01/24/2015   GLUCOSE 102* 01/25/2015   GLUCOSE 122* 01/24/2015   GLUCOSE 109* 01/24/2015   BUN 40* 01/25/2015   BUN 40* 01/24/2015   BUN 46* 01/24/2015   CREATININE 3.72* 01/25/2015   CREATININE 3.84* 01/24/2015   CREATININE 4.17* 01/24/2015   CALCIUM 8.5* 01/25/2015   CALCIUM 8.9 01/25/2015   CALCIUM 9.2 01/24/2015   LFT  Recent Labs  01/24/15 0433 01/25/15 0346  PROT 7.4  --   ALBUMIN 3.8 3.1*  AST 29  --   ALT 22  --   ALKPHOS 56  --   BILITOT 1.3*  --    PT/INR Lab Results  Component Value Date   INR 0.98 01/24/2015   Hepatitis Panel  Recent Labs  01/25/15 1013  HCVAB Reactive*   Lipase     Component Value Date/Time   LIPASE 173* 01/24/2015 0812    Drugs of Abuse     Component Value Date/Time   LABOPIA NONE DETECTED 01/24/2015 0528   COCAINSCRNUR POSITIVE* 01/24/2015 0528   LABBENZ  NONE DETECTED 01/24/2015 0528   AMPHETMU NONE DETECTED 01/24/2015 0528   THCU NONE DETECTED 01/24/2015 0528   LABBARB NONE DETECTED 01/24/2015 0528     RADIOLOGY STUDIES: US Renal  01/24/2015   CLINICAL DATA:  Acute renal failure  EXAM: RENAL / URINARY TRACT ULTRASOUND COMPLETE  COMPARISON:  None.  FINDINGS: Right Kidney:  Length: 9.4 cm. Appears echogenic with a lobular contour. No mass or hydronephrosis noted.  Left Kidney:  Length: 9.7 cm. Increased parenchymal echogenicity. Cyst within the upper pole measures 3.3 x 3.4 x 3.5 cm.  Bladder:  Appears normal for degree of bladder distention.  IMPRESSION: 1. Bilateral echogenic kidneys compatible with chronic medical renal disease. 2. Left renal cyst.   Electronically Signed   By: Signa Kell M.D.   On: 01/24/2015 20:57    ENDOSCOPIC STUDIES: None ever  IMPRESSION:   *  Scant streaks heme in emesis on one occasion yesterday. Staff thinks this may be sputum he coughed up, given pH of emesis not c/w gastric contents.  Loose, black, FOBT+ stool 1. + dysphagia and emesis vs regurgitation at home.  rule out GER with esophageal stricture, rule out Barrett's esophagus, rule out esophageal cancer.  CT scan shows colonic diverticulosis, no neoplasia. .  *  weight loss, unspecified amount.  rule out ulcer disease, rule out occult neoplasia rule  out weight loss secondary to lifestyle.   *  Normocytic anemia.  Hemoglobin down from 13.6-9.5 within the last 24 hours.  FOBT +.  Also may have anemia from his chronic kidney disease.  *  Hypertensive emergency on arrival to ED.  Likely due to combination of cocaine and untreated hypertension.  *  Thrombocytopenia.  *  Hep C positive.  liver appearance on CT scan unremarkable. No mention of portal gastropathy or varices on CT.   *  Elevated lipase. CT imaging of pancreas, liver and biliary tract unremarkable.  LFTs, with the exception of a mild elevation of total bilirubin, are normal.  *  Elevated  troponin I.  *  Acute renal failure.  At this point deemed to have stage IV CKD. However because he's never had medical care, baseline renal function not known. Proteinuria, microscopic hematuria on urinalysis. Renal ultrasound shows echogenic kidneys compatible with chronic medical renal disease and left renal cyst.  *  Substance abuse with tox screen positive for cocaine. Admits to smoking crack. Also to 80 oz beer per day.   *  Long-term lack of medical care.    PLAN:     *  making arrangements for EGD with [facility] for tomorrow.  EGD will need to be endorsed and approved by Dr. Leone Payor.  okay if he eats today so I ordered a soft diet. *  I discontinued order to repeat FOBT testing, we already know he seemed positive and don't need to continually recheck for this. *  Switched Protonix to 40 mg po twice daily.  * May eventually need colonoscopy, but going to wait on this test given it may be difficult for him to prep and given cardiac ischemia.    Jennye Moccasin  01/26/2015, 11:18 AM Pager: [phone]     [facility] Attending  I have also seen and assessed the patient and agree with the advanced practitioner's assessment and plan. Hx/PE same.  Hematemesis, dysphagia, weight loss warrant EGD. The risks and benefits as well as alternatives of endoscopic procedure(s) have been discussed and reviewed. All questions answered. The patient agrees to proceed. Possible esophageal dilation explained also.   HCV Ab + - probably a moot point to w/u further right now.  Consider RNA level after above.  Iva Boop, MD, [facility] [phone] (pager) 01/26/2015 4:47 PM

## 2015-01-25 NOTE — Progress Notes (Addendum)
ANTICOAGULATION CONSULT NOTE -Follow up  Pharmacy Consult for Heparin Indication: chest pain/ACS  No Known Allergies  Patient Measurements: Height: 5\' 6"  (167.6 cm) Weight: 109 lb 4.8 oz (49.578 kg) IBW/kg (Calculated) : 63.8 Heparin Dosing Weight:  49.5 kg   Vital Signs: Temp: 97.6 F (36.4 C) (05/10 0801) Temp Source: Oral (05/10 0801) BP: 201/107 mmHg (05/10 0801) Pulse Rate: 69 (05/10 0801)  Labs:  Recent Labs  01/24/15 0433  01/24/15 0812 01/24/15 1508 01/24/15 1930 01/25/15 0346 01/25/15 1100  HGB 13.6  --   --   --   --  10.8*  --   HCT 38.9*  --   --   --   --  31.1*  --   PLT 148*  --   --   --   --  128*  --   LABPROT  --   --  13.1  --   --   --   --   INR  --   --  0.98  --   --   --   --   HEPARINUNFRC  --   --   --   --   --  0.32 0.60  CREATININE 4.17*  --   --   --  3.84* 3.72*  --   TROPONINI  --   < > 0.07* 1.35* 1.98* 2.87*  --   < > = values in this interval not displayed.  Estimated Creatinine Clearance: 14.1 mL/min (by C-G formula based on Cr of 3.72).   Medical History: Past Medical History  Diagnosis Date  . Patient denies medical problems   . Chronic kidney disease     ckd  . Hypokalemia   . Thrombocytopenia   . Hypertension    Assessment: 64yo male on IV heparin for ACS.    Repeat heparin level is therapeutic on rate of 600 units/hr. Hgb down to 10.8, Hct 31.1, pltc down. SCr is improving. RN notes blood-tinged sputum with emesis and small loose blackish bowel movement. MD has been notified. No change to heparin at this time.    Goal of Therapy:  Heparin level 0.3-0.7 units/ml Monitor platelets by anticoagulation protocol: Yes   Plan:  Continue Heparin drip at 600 units/hr Check HL 8hr to confirm that not accumulating.  Daily HL, CBC F/up for further signs of bleeding.   Link SnufferJessica Jaylynne Birkhead, PharmD, BCPS Clinical Pharmacist [phone] 01/25/2015 1:26 PM

## 2015-01-25 NOTE — Progress Notes (Signed)
ANTICOAGULATION CONSULT NOTE - Follow Up Consult  Pharmacy Consult for Heparin Indication: chest pain/ACS  No Known Allergies  Patient Measurements: Height: 5\' 6"  (167.6 cm) Weight: 109 lb 4.8 oz (49.578 kg) IBW/kg (Calculated) : 63.8 Heparin Dosing Weight: 49.6 kg  Vital Signs: BP: 171/98 mmHg (05/10 2000) Pulse Rate: 57 (05/10 2000)  Labs:  Recent Labs  01/24/15 0433  01/24/15 0812 01/24/15 1508 01/24/15 1930 01/25/15 0346 01/25/15 1100 01/25/15 1705 01/25/15 1845  HGB 13.6  --   --   --   --  10.8*  --  10.1*  --   HCT 38.9*  --   --   --   --  31.1*  --  28.9*  --   PLT 148*  --   --   --   --  128*  --  139*  --   LABPROT  --   --  13.1  --   --   --   --   --   --   INR  --   --  0.98  --   --   --   --   --   --   HEPARINUNFRC  --   --   --   --   --  0.32 0.60  --  0.59  CREATININE 4.17*  --   --   --  3.84* 3.72*  --   --   --   TROPONINI  --   < > 0.07* 1.35* 1.98* 2.87*  --   --   --   < > = values in this interval not displayed.  Estimated Creatinine Clearance: 14.1 mL/min (by C-G formula based on Cr of 3.72).  Assessment:   Heparin level tonight is therapeutic (0.59) on 600 units/hr.  Consistent with prior values. Hgb trended down a bit. Platelet count remains low but no further drop.   Positive FOB this afternoon.   Noted vomiting blood today, but described as blood-tinged sputum with emesis as opposed to frank blood.  Patient resting now, but denies recurrence. GI to see on 01/26/15, or tonight if emergency.  Protonix increased to 40 mg IV BID.  Goal of Therapy:  Heparin level 0.3-0.5 units/ml Monitor platelets by anticoagulation protocol: Yes   Plan:   Decrease heparin drip to 500 units/hr.  Will change target heparin levels to lower end of therapeutic range.  Next heparin level and CBC in am.  Follow up for any further s/sx bleeding and FOB checks.  Dennie FettersEgan, Daneya Hartgrove Donovan, RPh Pager: [phone] 01/25/2015,8:49 PM

## 2015-01-25 NOTE — Progress Notes (Signed)
  Date: 01/25/2015  Patient name: Lee Swanson  Medical record number: [id number]  Date of birth: 02/10/1951   I have seen and evaluated Lee StammerPreston R Lartigue and discussed their care with the Residency Team. Mr Steva ColderMottley was seen with Dr Shirlee LatchMcLean this AM. He was admitted for NSTEMI, acute pancreatitis, acute on chronic renal failure, weight loss, and HTN urgency. He feels better this AM. He denies CP, his ABD has also resolved and he is requesting something by mouth, his HA comes and goes, and he still has L eye blurry vision which has been going on for over 1 yr and has not changed recently. 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 Has basic understanding of medical conditions today and importance of cocaine cessation and MD F/U.   Filed Vitals:   01/25/15 0801  BP: 201/107  Pulse: 69  Temp: 97.6 F (36.4 C)  Resp: 16   Gen Sitting SOB in NAD. Breathing comfortably.  HRRR no MRG LCTAB ABD + BS, soft, NT Ext no edema  Assessment and Plan: I have seen and evaluated the patient as outlined above. I agree with the formulated Assessment and Plan as detailed in the residents' admission note, with the following changes:   1. NSTEMI vs demand ischemia - Appreciate Dr Chales AbrahamsSkeins' notes. He is not a cath candidate 2/2 renal failure. Medical mgmt is the focus now - BP control, BB, ASA, IV heparin. No statin as pancreatitis concern. He knows that he needs to stop cocaine.   2. Malignant HTN - He likely has long standing uncontrolled HTN and therefore, the team is not lowering his BP too quickly. Coreg PO has been added to the NTG gtt and he will need additional PO meds. Verapamil would be a better choice than amlodipine since it has antiproteinuric effects and is on $4 [employer] list. Titrate off the NTG gtt as the PO's take effect.  3. Acute on likely chronic renal failure - he has no old Cr to compare but renal US shows chronic medical renal dz.  Cr trending down a bit and  unclear what his baseline will be.Follow Cr, check PTH, phos, outpt renal F/U.   4. Acute pancreatitis - the likely cause is ETOH since CT showed no GB dz. Exam nl today and pt will try clears.   Burns SpainElizabeth A Vandy Fong, MD 5/10/201611:36 AM

## 2015-01-25 NOTE — Progress Notes (Signed)
Called by RN. Pt started vomiting blood with dark stools on heparin. FOBT +, gastrooccult pending. Change 40 mg qd iv Protonix to 40 bid. Consider holding Heparin level pending. Consider GI consult in am (will curbside now), if worsening overnight will call tonight. Stat CBC and type and screen   Shirlee LatchMcLean MD

## 2015-01-25 NOTE — Progress Notes (Signed)
Subjective: BP remained high overnight in 200s systolic. Nitro gtt currently at 140 mcg.   Patient denies any chest pain or dyspnea this morning. He states his abdominal pain has improved and he would like to try to drink orange juice. He does note some nausea but denies vomiting.   Objective: Vital signs in last 24 hours: Filed Vitals:   01/25/15 0722 01/25/15 0732 01/25/15 0742 01/25/15 0801  BP: 189/110 197/117 195/116 201/107  Pulse: 64 69 68 69  Temp:    97.6 F (36.4 C)  TempSrc:    Oral  Resp: Height:      Weight:      SpO2: 100% 99% 99% 99%   Weight change:   Intake/Output Summary (Last 24 hours) at 01/25/15 1002 Last data filed at 01/25/15 0826  Gross per 24 hour  Intake 1898.63 ml  Output   1250 ml  Net 648.63 ml   Physical Exam General: thin appearing man sitting up in bed, pleasant, NAD HEENT: [place]/AT, EOMI, mucus membranes dry CV: RRR, no m/g/r Pulm: CTA bilaterally, breaths non-labored Abd: BS+, soft, non-tender, non-distended Ext: warm, no edema Neuro: alert and oriented x 3, no focal deficits   Lab Results: Basic Metabolic Panel:  Recent Labs Lab 01/24/15 0812 01/24/15 1930 01/25/15 0346  NA  --  137 136  K  --  4.0 4.3  CL  --  104 108  CO2  --  22 21*  GLUCOSE  --  122* 102*  BUN  --  40* 40*  CREATININE  --  3.84* 3.72*  CALCIUM  --  9.2 8.9  MG 2.1  --   --   PHOS 4.0  --  5.0*   Liver Function Tests:  Recent Labs Lab 01/24/15 0433 01/25/15 0346  AST 29  --   ALT 22  --   ALKPHOS 56  --   BILITOT 1.3*  --   PROT 7.4  --   ALBUMIN 3.8 3.1*    Recent Labs Lab 01/24/15 0812  LIPASE 173*   CBC:  Recent Labs Lab 01/24/15 0433 01/25/15 0346  WBC 6.3 6.1  HGB 13.6 10.8*  HCT 38.9* 31.1*  MCV 84.4 84.7  PLT 148* 128*   Cardiac Enzymes:  Recent Labs Lab 01/24/15 1508 01/24/15 1930 01/25/15 0346  TROPONINI 1.35* 1.98* 2.87*   CBG:  Recent Labs Lab 01/24/15 0426  GLUCAP 98   Fasting Lipid  Panel:  Recent Labs Lab 01/25/15 0346  CHOL 148  HDL 62  LDLCALC 76  TRIG 49  CHOLHDL 2.4   Thyroid Function Tests:  Recent Labs Lab 01/25/15 0346  TSH 1.395   Coagulation:  Recent Labs Lab 01/24/15 0812  LABPROT 13.1  INR 0.98   Urine Drug Screen: Drugs of Abuse     Component Value Date/Time   LABOPIA NONE DETECTED 01/24/2015 0528   COCAINSCRNUR POSITIVE* 01/24/2015 0528   LABBENZ NONE DETECTED 01/24/2015 0528   AMPHETMU NONE DETECTED 01/24/2015 0528   THCU NONE DETECTED 01/24/2015 0528   LABBARB NONE DETECTED 01/24/2015 0528    Alcohol Level:  Recent Labs Lab 01/24/15 0812  ETH <5   Urinalysis:  Recent Labs Lab 01/24/15 0758  COLORURINE YELLOW  LABSPEC 1.010  PHURINE 5.5  GLUCOSEU NEGATIVE  HGBUR SMALL*  BILIRUBINUR NEGATIVE  KETONESUR NEGATIVE  PROTEINUR >300*  UROBILINOGEN 1.0  NITRITE NEGATIVE  LEUKOCYTESUR TRACE*   Studies/Results: Ct Abdomen Pelvis Wo Contrast  01/24/2015   CLINICAL DATA:  Hypertension and shortness of breath. Frequent urination.  EXAM: CT ABDOMEN AND PELVIS WITHOUT CONTRAST  TECHNIQUE: Multidetector CT imaging of the abdomen and pelvis was performed following the standard protocol without IV contrast.  COMPARISON:  None.  FINDINGS: BODY WALL: No contributory findings.  LOWER CHEST: Proximal left circulation coronary artery atherosclerosis.  ABDOMEN/PELVIS:  Liver: No focal abnormality.  Biliary: No evidence of biliary obstruction or stone.  Pancreas: Unremarkable.  Spleen: Unremarkable.  Adrenals: Unremarkable.  Kidneys and ureters: No hydronephrosis or stone. Fetal appearing lobulation of the bilateral kidneys. An exophytic 3 cm presumed cyst noted from the upper pole left kidney.  Bladder: Mild circumferential wall thickening, accentuated by underdistention.  Reproductive: Marked symmetric enlargement of the prostate gland, 6.5 cm in transverse dimension  Bowel: No obstruction. Negative appendix. Mild colonic diverticulosis.   Retroperitoneum: No mass or adenopathy.  Peritoneum: No ascites or pneumoperitoneum.  Vascular: No acute abnormality.  OSSEOUS: No acute abnormalities. L4-5 and L5-S1 degenerative disc narrowing and spurring.  IMPRESSION: 1. No acute intra-abdominal findings. 2. Marked prostatomegaly. 3. Colonic diverticulosis. 4. Coronary atherosclerosis.   Electronically Signed   By: Marnee Spring M.D.   On: 01/24/2015 08:29   Dg Chest 2 View  01/24/2015   CLINICAL DATA:  Shortness of Breath  EXAM: CHEST  2 VIEW  COMPARISON:  None.  FINDINGS: There is no edema or consolidation. The heart size and pulmonary vascularity are normal. No adenopathy. There is evidence of old rib trauma involving the posterior left eighth rib.  IMPRESSION: No edema or consolidation.   Electronically Signed   By: Bretta Bang III M.D.   On: 01/24/2015 07:16   US Renal  01/24/2015   CLINICAL DATA:  Acute renal failure  EXAM: RENAL / URINARY TRACT ULTRASOUND COMPLETE  COMPARISON:  None.  FINDINGS: Right Kidney:  Length: 9.4 cm. Appears echogenic with a lobular contour. No mass or hydronephrosis noted.  Left Kidney:  Length: 9.7 cm. Increased parenchymal echogenicity. Cyst within the upper pole measures 3.3 x 3.4 x 3.5 cm.  Bladder:  Appears normal for degree of bladder distention.  IMPRESSION: 1. Bilateral echogenic kidneys compatible with chronic medical renal disease. 2. Left renal cyst.   Electronically Signed   By: Signa Kell M.D.   On: 01/24/2015 20:57   Medications: I have reviewed the patient's current medications.  Assessment/Plan:   NSTEMI: Patient presented with in hypertensive emergency with 10/10 "tightening" chest pain that radiated to his left side associated with nausea, vomiting, and SOB. EKG with ST depression in the lateral leads concerning for ischemia. Initial troponin minimally elevated at 0.07 but then next troponins were elevated at 1.35>1.98>2.87. Cardiology was consulted and stated he was not a candidate for  cardiac cath given his renal function. He was started on a nonselective beta blocker in setting of cocaine use 4 days ago and a heparin gtt. His chest pain is now resolved. Will focus on modifying his risk factors and getting BP under control.  - Cardiology following, appreciate recommendations - Continue heparin gtt for next 24 hours - Coreg increased to 12.5 mg BID per cards - Continue nitro gtt, goal BP in 170s today - Add Amlodipine 5 mg daily, hopefully can go down on nitro gtt gradually, can increase as BP tolerates - Add Hydralazine tomorrow with goal of getting off drip  - f/u echo  - Nitro PRN - Zofran PRN nausea - Cardiac monitoring   Hypertensive Emergency: Patient presented with BP 233/145 with chest pain and found  to have AKI on CKD. Patient had not seen a physician in over 40 years and likely has chronic uncontrolled HTN. Although no cardiomegaly appreciated on CXR. He was started on a nitro gtt and BP improved to the 190s-200s systolic. His BP is still elevated in the 200s this morning. No neurologic deficits. Nitro gtt not fully maxed as only at 140 mcg. Will set goal BP for 170s today. Coreg was increased to 12.5 mg BID. Will add amlodipine which will hopefully be able to titrate off the drip a bit and then add hydralazine tomorrow with goal of getting off nitro gtt completely.  - Continue nitro gtt - BP goal for 170s today - Start amlodipine 5 mg daily, can increase as BP tolerates - Start hydralazine tomorrow, will increase as tolerated - f/u echo - Neuro checks per unit  AKI on CKD Stage 4: Cr 4.17 on admission, has now improved to 3.72 after IVF. He has underlying CKD from likely chronic uncontrolled HTN. Also likely volume depleted due to poor PO intake with nausea/vomiting from pancreatitis. His renal US is consistent with chronic medical disease, no obstruction present. He had a 3.3 x 3.4 x 2.5 cm cyst on the left kidney as well. Will continue IVF until able to tolerate PO  intake. He is urinating well. Will also check for secondary hyperparathyroidism as Ph elevated at 5.0.  - Continue to monitor Cr - Check PTH - Will need outpatient renal follow up   Acute on chronic? Pancreatitis: Patient presented with 10/10 abdominal pain, nausea, vomiting, and unable to keep anything down with elevated lipase of 173. Patient notes he had similar abdominal pain a few months ago. CT Abd did not show evidence of pancreatitis but his clinical picture is consistent with pancreatitis. He states he drinks 1-2 beers daily but I suspect he drinks more than this. He states his abdominal symptoms have improved this morning and would like to try a diet. Will start with clears and advance slowly.  - Continue IVF at 8275 - Start clear liquid diet, can advance as tolerated - Zofran PRN nausea   Headache: Likely secondary to extremely elevated BPs. Patient states his headache is a little better this morning. No neurological deficits on exam.  - Continue to monitor - Tylenol PRN  Weight Loss: Patient states he has noticed weight loss within the last month. He is unsure how long ago the weight loss started. He reports he used to weigh between 140-150 lbs. His current weight is 109 lbs. His Hbg is low at 10.8 this morning. His Hbg on admission was 13.6 but this was likely hemoconcentrated. He denies any melena or blood in his stool. He is a smoker placing him at higher risk for malignancy. CXR did not show evidence of any nodules or masses. CT Abd/Pelvis remarkable for diverticulosis and prostatomegaly, no colon masses. Prostate cancer is a possibility for his weight loss. He has never had a colonoscopy. He states his mother died from colon cancer. HIV antibody is non-reactive. Patient will need further work up for possible malignancy as an outpatient. Will schedule outpatient GI appointment for colonoscopy.   Thrombocytopenia: Platelets 148 on admission, 128 this morning. Likely secondary to chronic  alcohol abuse.  - Continue to monitor   Substance Abuse: Patient reports drinking alcohol daily and tobacco use daily (1 ppd). He did not admit to cocaine use at first but then admitted to cocaine use 4 days prior to admission after he was told UDS results.  Patient was encouraged to stop using these substances, especially the cocaine as this places him at risk for sudden cardiac death.  - Continue to encourage substance cessation  BPH: Patient reports difficulty starting a stream. BPH noted on CT Abd/Pelvis.  - Consider starting Flomax    Diet: Clear liquids, advance gradually as tolerates VTE PPx: Heparin gtt Dispo: Disposition is deferred at this time, awaiting improvement of current medical problems.  Anticipated discharge in approximately 1-2 day(s).   The patient does not have a current PCP (No primary care provider on file.) and [facility].  The patient does not have transportation limitations that hinder transportation to clinic appointments.  .Services Needed at time of discharge: Y = Yes, Blank = No PT:   OT:   RN:   Equipment:   Other:     LOS: 1 day   Su Hoffarly J Rivet, MD 01/25/2015, 10:02 AM

## 2015-01-25 NOTE — Progress Notes (Signed)
Pt found trying to get out of bed, confused, falls asleep quickly and slumps over bed rail, able to arouse with physical pressure, in bed with eyes closed at present, occasional verbalizations and restless physical activity, bed alarm set, rails up X3, HR 47-60, BP 177/102, Sa02 100% Room air,  Resp 10 - 16, MD messaged, will continue to monitor closely.

## 2015-01-25 NOTE — Progress Notes (Signed)
Pt nauseated, states it is his medication, blood tinged sputum noted, had small loose blackish bowel movement, [facility]an [facility], MD notified.

## 2015-01-25 NOTE — Care Management Note (Signed)
Case Management Note  Patient Details  Name: Lee Swanson MRN: [id number] Date of Birth: [birth year]  Subjective/Objective:     [facility] Initiated on IV nitro gtt.                Action/Plan: Pt without PCP. CM did place call to MD to see if will pick the pt up in the clinic. If not CM will set pt up @ the [facility] for PCP and medication assistance. Will continue to monitor.    Expected Discharge Date:                  Expected Discharge Plan:  Home w Home Health Services  In-House Referral:     Discharge planning Services  CM Consult  Post Acute Care Choice:    Choice offered to:     DME Arranged:    DME Agency:     HH Arranged:    HH Agency:     Status of Service:     Medicare Important Message Given:    Date Medicare IM Given:    Medicare IM give by:    Date Additional Medicare IM Given:    Additional Medicare Important Message give by:     If discussed at Long Length of Stay Meetings, dates discussed:    Additional Comments:  Gala LewandowskyGraves-Bigelow, Evie Croston Kaye, RN 01/25/2015, 3:37 PM

## 2015-01-25 NOTE — Progress Notes (Signed)
Pt continues to have headache and nausea, MD notified, phenergan IV given per orders

## 2015-01-25 NOTE — Progress Notes (Addendum)
Confirmed titration orders for Nitro with Dr Loma NewtonNgo to increase by 2510mcg/min up to 200 mcg/min or effective BP control.  Primary RN Gavin Poundeborah updated.

## 2015-01-25 NOTE — Progress Notes (Signed)
ANTICOAGULATION CONSULT NOTE -Follow up  Pharmacy Consult for Heparin Indication: chest pain/ACS  No Known Allergies  Patient Measurements: Height: 5\' 6"  (167.6 cm) Weight: 109 lb 4.8 oz (49.578 kg) IBW/kg (Calculated) : 63.8 Heparin Dosing Weight:  49.5 kg   Vital Signs: Temp: 97.5 F (36.4 C) (05/10 0404) BP: 199/111 mmHg (05/10 0404) Pulse Rate: 61 (05/10 0404)  Labs:  Recent Labs  01/24/15 0433 01/24/15 0812 01/24/15 1508 01/24/15 1930 01/25/15 0346  HGB 13.6  --   --   --  10.8*  HCT 38.9*  --   --   --  31.1*  PLT 148*  --   --   --  128*  LABPROT  --  13.1  --   --   --   INR  --  0.98  --   --   --   HEPARINUNFRC  --   --   --   --  0.32  CREATININE 4.17*  --   --  3.84*  --   TROPONINI  --  0.07* 1.35* 1.98*  --     Estimated Creatinine Clearance: 13.6 mL/min (by C-G formula based on Cr of 3.84).   Medical History: Past Medical History  Diagnosis Date  . Patient denies medical problems     Medications:  Scheduled:  . aspirin EC  81 mg Oral Daily  . carvedilol  6.25 mg Oral BID WC  . folic acid  1 mg Oral Daily  . multivitamin with minerals  1 tablet Oral Daily  . nicotine  21 mg Transdermal Daily  . pantoprazole (PROTONIX) IV  40 mg Intravenous Q24H  . sodium chloride  3 mL Intravenous Q12H  . thiamine  100 mg Oral Daily   Or  . thiamine  100 mg Intravenous Daily    Assessment: Initial 8 hr heparin level is 0.32 on 600 units/hr IV heparin for NSTEMI in this 64yo male.  Therapeutic level, at low end of goal. Hgb down to 10.8, Hct 31.1, pltc down. No bleeding noted per RN's report. SCr 3.72, down from 4.17  He has not seen a physician in 40y and is on no medications at home.  He is found to be CKD-4. UDS (+)Cocaine   Goal of Therapy:  Heparin level 0.3-0.7 units/ml Monitor platelets by anticoagulation protocol: Yes   Plan:  Continue Heparin drip at 600 units/hr Check HL 6hr to confirm remains therapeutic. Daily HL, CBC Watch for s/s  of bleeding.  Noah Delaineuth Rahul Malinak, RPh Clinical Pharmacist Pager: [phone] 01/25/2015 6:04 AM

## 2015-01-25 NOTE — Progress Notes (Signed)
Called PA-C for [facility] hematemesis and FOBT +. They will see patient. If needed call them overnight for an emergency. Likely will need to stop heparin   Shirlee LatchMcLean MD

## 2015-01-25 NOTE — Progress Notes (Signed)
Initial Nutrition Assessment  DOCUMENTATION CODES:  Underweight, Severe malnutrition in context of chronic illness  INTERVENTION:  Boost Breeze (PO TID, each supplement provides 250 kcals and 9 gm protein)  NUTRITION DIAGNOSIS:  Malnutrition related to chronic illness as evidenced by severe depletion of body fat, severe depletion of muscle mass.   GOAL:  Patient will meet greater than or equal to 90% of their needs   MONITOR:  PO intake, Supplement acceptance, Labs, Weight trends  REASON FOR ASSESSMENT:  Malnutrition Screening Tool    ASSESSMENT:  Patient admitted for NSTEMI, acute pancreatitis, acute on chronic renal failure, weight loss, and HTN urgency.  Patient c/o severe headache during RD visit. He knows that he has lost a lot of weight, but unsure of how much. He has been eating poorly recently. Now with N/V.  Labs reviewed. Phosphorus elevated at 5.  Height:  Ht Readings from Last 1 Encounters:  01/24/15 5\' 6"  (1.676 m)    Weight:  Wt Readings from Last 1 Encounters:  01/25/15 109 lb 4.8 oz (49.578 kg)    Ideal Body Weight:  64.5 kg  Wt Readings from Last 10 Encounters:  01/25/15 109 lb 4.8 oz (49.578 kg)    BMI:  Body mass index is 17.65 kg/(m^2).  Estimated Nutritional Needs:  Kcal:  1600-1800  Protein:  80-90 gm  Fluid:  1.6-1.8 L  Skin:  Reviewed, no issues  Diet Order:  Diet clear liquid Room service appropriate?: Yes; Fluid consistency:: Thin  EDUCATION NEEDS:  Education needs no appropriate at this time   Intake/Output Summary (Last 24 hours) at 01/25/15 1504 Last data filed at 01/25/15 1400  Gross per 24 hour  Intake 1898.63 ml  Output   1451 ml  Net 447.63 ml    Last BM:  5/10   Joaquin CourtsKimberly Harris, RD, LDN, CNSC Pager [phone] After Hours Pager [phone]

## 2015-01-25 NOTE — Progress Notes (Signed)
UR Completed Marine Lezotte Graves-Bigelow, RN,BSN [phone]  

## 2015-01-26 ENCOUNTER — Inpatient Hospital Stay (HOSPITAL_COMMUNITY): Payer: Medicaid Other

## 2015-01-26 ENCOUNTER — Other Ambulatory Visit: Payer: Self-pay

## 2015-01-26 ENCOUNTER — Encounter (HOSPITAL_COMMUNITY): Payer: Self-pay | Admitting: Physician Assistant

## 2015-01-26 DIAGNOSIS — D649 Anemia, unspecified: Secondary | ICD-10-CM

## 2015-01-26 DIAGNOSIS — R079 Chest pain, unspecified: Secondary | ICD-10-CM

## 2015-01-26 DIAGNOSIS — K920 Hematemesis: Secondary | ICD-10-CM | POA: Diagnosis present

## 2015-01-26 DIAGNOSIS — R110 Nausea: Secondary | ICD-10-CM

## 2015-01-26 DIAGNOSIS — K852 Alcohol induced acute pancreatitis without necrosis or infection: Secondary | ICD-10-CM | POA: Diagnosis present

## 2015-01-26 DIAGNOSIS — E46 Unspecified protein-calorie malnutrition: Secondary | ICD-10-CM

## 2015-01-26 DIAGNOSIS — I214 Non-ST elevation (NSTEMI) myocardial infarction: Principal | ICD-10-CM

## 2015-01-26 DIAGNOSIS — E559 Vitamin D deficiency, unspecified: Secondary | ICD-10-CM

## 2015-01-26 DIAGNOSIS — R1314 Dysphagia, pharyngoesophageal phase: Secondary | ICD-10-CM

## 2015-01-26 DIAGNOSIS — K922 Gastrointestinal hemorrhage, unspecified: Secondary | ICD-10-CM

## 2015-01-26 DIAGNOSIS — Z720 Tobacco use: Secondary | ICD-10-CM

## 2015-01-26 LAB — CBC
HCT: 27.9 % — ABNORMAL LOW (ref 39.0–52.0)
HCT: 26.7 % — ABNORMAL LOW (ref 39.0–52.0)
Hemoglobin: 9.5 g/dL — ABNORMAL LOW (ref 13.0–17.0)
Hemoglobin: 9.1 g/dL — ABNORMAL LOW (ref 13.0–17.0)
MCH: 29.1 pg (ref 26.0–34.0)
MCH: 29.2 pg (ref 26.0–34.0)
MCHC: 34.1 g/dL (ref 30.0–36.0)
MCHC: 34.1 g/dL (ref 30.0–36.0)
MCV: 85.6 fL (ref 78.0–100.0)
MCV: 85.6 fL (ref 78.0–100.0)
Platelets: 133 10*3/uL — ABNORMAL LOW (ref 150–400)
Platelets: 121 10*3/uL — ABNORMAL LOW (ref 150–400)
RBC: 3.26 MIL/uL — ABNORMAL LOW (ref 4.22–5.81)
RBC: 3.12 MIL/uL — ABNORMAL LOW (ref 4.22–5.81)
RDW: 13 % (ref 11.5–15.5)
RDW: 13.2 % (ref 11.5–15.5)
WBC: 7.8 10*3/uL (ref 4.0–10.5)
WBC: 5.3 10*3/uL (ref 4.0–10.5)

## 2015-01-26 LAB — HEPARIN LEVEL (UNFRACTIONATED): Heparin Unfractionated: 0.57 IU/mL (ref 0.30–0.70)

## 2015-01-26 LAB — PTH, INTACT AND CALCIUM
CALCIUM TOTAL (PTH): 8.5 mg/dL — AB (ref 8.6–10.2)
PTH: 80 pg/mL — ABNORMAL HIGH (ref 15–65)

## 2015-01-26 LAB — HEPATITIS C ANTIBODY: HCV AB: REACTIVE — AB

## 2015-01-26 MED ORDER — HYDRALAZINE HCL 25 MG PO TABS
25.0000 mg | ORAL_TABLET | Freq: Three times a day (TID) | ORAL | Status: DC
  Administered 2015-01-26: 25 mg via ORAL
  Filled 2015-01-26: qty 1

## 2015-01-26 MED ORDER — SEVELAMER CARBONATE 800 MG PO TABS
800.0000 mg | ORAL_TABLET | Freq: Three times a day (TID) | ORAL | Status: DC
  Administered 2015-01-26 – 2015-01-29 (×8): 800 mg via ORAL
  Filled 2015-01-26 (×8): qty 1

## 2015-01-26 MED ORDER — HYDRALAZINE HCL 10 MG PO TABS
10.0000 mg | ORAL_TABLET | Freq: Three times a day (TID) | ORAL | Status: DC
  Administered 2015-01-26: 10 mg via ORAL
  Filled 2015-01-26: qty 1

## 2015-01-26 MED ORDER — PANTOPRAZOLE SODIUM 40 MG PO TBEC
40.0000 mg | DELAYED_RELEASE_TABLET | Freq: Two times a day (BID) | ORAL | Status: DC
  Administered 2015-01-26 – 2015-01-29 (×6): 40 mg via ORAL
  Filled 2015-01-26 (×6): qty 1

## 2015-01-26 MED ORDER — SODIUM CHLORIDE 0.9 % IV BOLUS (SEPSIS)
1000.0000 mL | Freq: Once | INTRAVENOUS | Status: AC
  Administered 2015-01-26: 1000 mL via INTRAVENOUS

## 2015-01-26 MED ORDER — SODIUM CHLORIDE 0.9 % IV SOLN
INTRAVENOUS | Status: DC
  Administered 2015-01-27: 08:00:00 via INTRAVENOUS

## 2015-01-26 NOTE — Progress Notes (Addendum)
[facility] Interirm Progress Note S: Reevaluated patient around 6pm. Patient reports he is back to his baseline.  He continues to have trouble with his vision especially in the right eye, he does feel this got a little worse after "taking that pill"earlier. Also after my previous visit I was called by nursing that he became acutely SOB, I obtained a CXR and held his fluids.  O: P 65 BP 144/93  Gen: resting in bed comfortable Eyes: EOMI, PERRL, gross vision intact on right eye, cannot distinguish fingers with right but can see gross colors bilaterally Card: RRR Pulm: no distress Abd: non tender  ZOX:[id number]:Junctional rhythm, with new TWI I, II, v3-v6 CXR: no offical read, by my interpretation, no plueral effusion or gross overload, possible new LLL infiltrate but hidden by diaphram   A/P: Patient seems to have had symptoms brought on when BP was lower, this could be dysregulation from chronic hypertension versus significant cerebrovascular stenosis.  -Will D/C hydralazine for not and shoot for a mean arterial pressure of around 100 -Can add low dose hydralazine back later if become hypertension - No need for fluids at this time - Still is concern for NSTEMI with TWI however we are currently maximizing his medical therapy since his renal function precludes cardiac cathertization, I have ordered another troponin -Will follow up radiology ready of CXR - Reasonable to obtain CT of head without contrast given his intermittent mental status and visual changes  Gust RungErik C Silverio Hagan, DO IMTS PGY-2 Pager: [phone]

## 2015-01-26 NOTE — Progress Notes (Signed)
Subjective: Patient had hematemesis yesterday afternoon and 1 black stool found to be FOBT+. Patient denies any further bleeding or dark stools. He reports feeling short of breath, headache, and nausea. He states he can handle clear liquids this morning. He denies any chest pain.   Objective: Vital signs in last 24 hours: Filed Vitals:   01/26/15 0900 01/26/15 1005 01/26/15 1100 01/26/15 1203  BP: 179/105 153/106 161/96 152/93  Pulse: 63 56 56 59  Temp:      TempSrc:      Resp: Height:      Weight:      SpO2: 100% 100% 100% 100%   Weight change: 5 lb 0.9 oz (2.293 kg)  Intake/Output Summary (Last 24 hours) at 01/26/15 1337 Last data filed at 01/26/15 0830  Gross per 24 hour  Intake    360 ml  Output   1151 ml  Net   -791 ml   Physical Exam General: thin appearing man sitting up in bed, pleasant, NAD HEENT: [place]/AT, EOMI, no blood in oral cavity, mucus membranes moist CV: RRR, no m/g/r Pulm: CTA bilaterally, breaths non-labored Abd: BS+, soft, non-tender, non-distended Ext: warm, no edema Neuro: alert and oriented x 3, no focal deficits   Lab Results: Basic Metabolic Panel:  Recent Labs Lab 01/24/15 0812 01/24/15 1930 01/25/15 0346 01/25/15 1013  NA  --  137 136  --   K  --  4.0 4.3  --   CL  --  104 108  --   CO2  --  22 21*  --   GLUCOSE  --  122* 102*  --   BUN  --  40* 40*  --   CREATININE  --  3.84* 3.72*  --   CALCIUM  --  9.2 8.9 8.5*  MG 2.1  --   --   --   PHOS 4.0  --  5.0*  --    Liver Function Tests:  Recent Labs Lab 01/24/15 0433 01/25/15 0346  AST 29  --   ALT 22  --   ALKPHOS 56  --   BILITOT 1.3*  --   PROT 7.4  --   ALBUMIN 3.8 3.1*    Recent Labs Lab 01/24/15 0812  LIPASE 173*   CBC:  Recent Labs Lab 01/25/15 1705 01/26/15 0303  WBC 7.2 7.8  HGB 10.1* 9.5*  HCT 28.9* 27.9*  MCV 84.3 85.6  PLT 139* 133*   Cardiac Enzymes:  Recent Labs Lab 01/24/15 1508 01/24/15 1930 01/25/15 0346  TROPONINI  1.35* 1.98* 2.87*   CBG:  Recent Labs Lab 01/24/15 0426  GLUCAP 98   Fasting Lipid Panel:  Recent Labs Lab 01/25/15 0346  CHOL 148  HDL 62  LDLCALC 76  TRIG 49  CHOLHDL 2.4   Thyroid Function Tests:  Recent Labs Lab 01/25/15 0346  TSH 1.395   Coagulation:  Recent Labs Lab 01/24/15 0812  LABPROT 13.1  INR 0.98   Urine Drug Screen: Drugs of Abuse     Component Value Date/Time   LABOPIA NONE DETECTED 01/24/2015 0528   COCAINSCRNUR POSITIVE* 01/24/2015 0528   LABBENZ NONE DETECTED 01/24/2015 0528   AMPHETMU NONE DETECTED 01/24/2015 0528   THCU NONE DETECTED 01/24/2015 0528   LABBARB NONE DETECTED 01/24/2015 0528    Alcohol Level:  Recent Labs Lab 01/24/15 0812  ETH <5   Urinalysis:  Recent Labs Lab 01/24/15 0758  COLORURINE YELLOW  LABSPEC 1.010  PHURINE 5.5  GLUCOSEU NEGATIVE  HGBUR SMALL*  BILIRUBINUR NEGATIVE  KETONESUR NEGATIVE  PROTEINUR >300*  UROBILINOGEN 1.0  NITRITE NEGATIVE  LEUKOCYTESUR TRACE*   Studies/Results: [facility]  01/24/2015   CLINICAL DATA:  Acute renal failure  EXAM: RENAL / URINARY TRACT ULTRASOUND COMPLETE  COMPARISON:  None.  FINDINGS: Right Kidney:  Length: 9.4 cm. Appears echogenic with a lobular contour. No mass or hydronephrosis noted.  Left Kidney:  Length: 9.7 cm. Increased parenchymal echogenicity. Cyst within the upper pole measures 3.3 x 3.4 x 3.5 cm.  Bladder:  Appears normal for degree of bladder distention.  IMPRESSION: 1. Bilateral echogenic kidneys compatible with chronic medical renal disease. 2. Left renal cyst.   Electronically Signed   By: Signa Kellaylor  Stroud M.D.   On: 01/24/2015 20:57   Medications: I have reviewed the patient's current medications.  Assessment/Plan:  NSTEMI: Troponins elevated up to 2.8 and EKG with ST depressions in lateral leads. Patient was on heparin gtt for day and a half but then had upper GI bleeding (see below) so drip was stopped. No longer having chest pain. Patient was educated  on harms of cocaine use and his high risk of sudden cardiac death. BP improved to 140s-150s systolic, weaning off nitro gtt.  - Cardiology signed off  - heparin gtt off given GI bleeding  - Continue Coreg 12.5 mg BID, consider increasing to 25 mg BID in AM depending upon HR and BP - Wean off nitro gtt - Start Verapamil 80 mg TID, increase as BP tolerates  - Start Hydralazine 25 mg TID, increase as BP tolerates  - f/u echo  - Nitro PRN - Zofran PRN nausea - Cardiac monitoring   Upper GI Bleed: Patient had single episode of hematemesis yesterday afternoon. Also noted to have dark stool and FOBT positive. Heparin gtt stopped. GI was consulted and likely plan to do EGD tomorrow afternoon. Patient has not had any additional episodes of hematemesis and Hbg stable at 9.5.  - GI following, appreciate recommendations - Protonix switched to 40 mg BID - Also needs colonoscopy but holding off for now given NSTEMI per GI. Will schedule as outpatient.   Hypertensive Emergency: BP in 140s-150s systolic this morning. Verapamil was started this morning instead of Amlodipine as it has beneficial effects for proteinuria. Hydralazine also added. Trying to get off of drip. Nurse instructed to titrate down.  - Titrate off nitro gtt - Start Verapamil 80 mg TID, can increase as BP tolerates - Start hydralazine 25 mg TID, will increase as tolerated - f/u echo  CKD Stage 4: Cr 4.17 on admission, has now improved to 3.72 after IVF. He has underlying CKD from likely chronic uncontrolled HTN. Renal US is consistent with chronic medical disease, no obstruction present. He had a 3.3 x 3.4 x 2.5 cm cyst on the left kidney as well. He is urinating well. PTH elevated at 80 with low calcium and high phosphorus consistent with secondary hyperparathyroidism.  - Continue to monitor Cr - Start Renvela 800 mg TID - Will need outpatient renal follow up   Acute Pancreatitis: Abdominal pain improved but still having nausea. Also  with hematemesis yesterday afternoon (see above). No tenderness on exam.  - Continue IVF at 75 - Continue clears/soft diet as tolerated  - Zofran PRN nausea   Vitamin D Deficiency: Vit D level noted to be low at 13.0 on admission. Patient will need Vitamin D supplementation with Ergocalciferol 50,000 units Qweekly for 8 weeks and then have levels rechecked. -  Continue Ergocalciferol 50,000 units Qweekly - Will need outpatient labs in 8 weeks   Normocytic Anemia: Hbg 13.6 on admission, likely hemoconcentrated. Hbg has been stable in 10s, but then had hematemesis episode as noted above. Hbg still stable at 9.5. He had a dark stool as well from the upper GI bleed. Will work up anemia with anemia panel.  - Anemia panel ordered for AM   Weight Loss: Patient states he has noticed weight loss within the last month. He is unsure how long ago the weight loss started. He reports he used to weigh between 140-150 lbs. His admission weight was 109 lbs, now 112 lbs. His Hbg has been low in 9-10 range (low prior to hematemesis episode). His Hbg on admission was 13.6 but this was likely hemoconcentrated. He is a smoker placing him at higher risk for malignancy. CXR did not show evidence of any nodules or masses. CT Abd/Pelvis remarkable for diverticulosis and prostatomegaly, no colon masses. Prostate cancer is a possibility for his weight loss. He has never had a colonoscopy. He states his mother died from colon cancer. HIV antibody is non-reactive. Patient will need further work up for possible malignancy as an outpatient. Will schedule outpatient GI appointment for colonoscopy.   Thrombocytopenia: Platelets 148 on admission, 133 this morning. Likely secondary to chronic alcohol abuse.  - Continue to monitor   Substance Abuse: Patient reports drinking alcohol daily and tobacco use daily (1 ppd). He did not admit to cocaine use at first but then admitted to cocaine use 4 days prior to admission after he was told  UDS results. Patient was encouraged to stop using these substances, especially the cocaine as this places him at risk for sudden cardiac death.  - Continue to encourage substance cessation  BPH: Patient reports difficulty starting a stream. BPH noted on CT Abd/Pelvis.  - Consider starting Flomax when tolerating PO intake better   Malnutrition: Patient with significant weight loss as noted above. Appears malnourished on exam.  - Nutrition following, appreciate recommendations - Started on Boost TID - Encouraged to ambulate halls with nurse today. If has trouble can consider PT consult   Diet: Soft/clears for now, NPO at midnight  VTE PPx: SCDs Dispo: Disposition is deferred at this time, awaiting improvement of current medical problems.  Anticipated discharge in approximately 2-3 day(s).   The patient does not have a current PCP (No primary care provider on file.) and [facility] after [facility].  The patient does not have transportation limitations that hinder transportation to clinic appointments.  .Services Needed at time of discharge: Y = Yes, Blank = No PT:   OT:   RN:   Equipment:   Other:     LOS: 2 days   Su Hoffarly J Jamel Holzmann, MD 01/26/2015, 1:37 PM

## 2015-01-26 NOTE — Progress Notes (Signed)
Patient Name: Lee Swanson R Barthold Date of Encounter: 01/26/2015  Primary Cardiologist: Dr. Myrtis SerKatz   Principal Problem:   Hypertensive emergency Active Problems:   Hypokalemia   AKI (acute kidney injury)   CKD (chronic kidney disease) stage 4, GFR 15-29 ml/min   Thrombocytopenia   Cocaine abuse   LVH (left ventricular hypertrophy)   Abdominal pain   Nausea and vomiting   Weight loss   Tobacco abuse   Chest pain   Headache   Syncope   Elevated troponin   Enlarged prostate   Protein-calorie malnutrition, severe    SUBJECTIVE  No more nausea this morning, denies any chest pain for the last 2 days. Denies any SOB. Had some significant nausea and dizziness yesterday after taking a "white pill"  CURRENT MEDS . aspirin EC  81 mg Oral Daily  . carvedilol  12.5 mg Oral BID WC  . feeding supplement (RESOURCE BREEZE)  1 Container Oral TID BM  . folic acid  1 mg Oral Daily  . hydrALAZINE  10 mg Oral 3 times per day  . multivitamin with minerals  1 tablet Oral Daily  . nicotine  21 mg Transdermal Daily  . pantoprazole (PROTONIX) IV  40 mg Intravenous Q12H  . sodium chloride  3 mL Intravenous Q12H  . thiamine  100 mg Oral Daily  . verapamil  80 mg Oral 3 times per day  . Vitamin D (Ergocalciferol)  50,000 Units Oral Q7 days    OBJECTIVE  Filed Vitals:   01/26/15 0442 01/26/15 0500 01/26/15 0622 01/26/15 0730  BP: 179/95 174/100 176/99 145/89  Pulse: 62 64  66  Temp:    98.8 F (37.1 C)  TempSrc:    Oral  Resp: 12 19  17   Height:      Weight:      SpO2: 99% 100%  100%    Intake/Output Summary (Last 24 hours) at 01/26/15 0802 Last data filed at 01/26/15 0641  Gross per 24 hour  Intake      0 ml  Output   1251 ml  Net  -1251 ml   Filed Weights   01/24/15 1302 01/25/15 0334 01/26/15 0430  Weight: 107 lb 9.6 oz (48.807 kg) 109 lb 4.8 oz (49.578 kg) 112 lb 10.5 oz (51.1 kg)    PHYSICAL EXAM  General: Pleasant, NAD. Neuro: Alert and oriented X 3. Moves all  extremities spontaneously. Psych: Normal affect. HEENT:  Normal  Neck: Supple without bruits or JVD. Lungs:  Resp regular and unlabored, CTA. Heart: RRR no s3, s4, or murmurs. Abdomen: Soft, non-tender, non-distended, BS + x 4.  Extremities: No clubbing, cyanosis or edema. DP/PT/Radials 2+ and equal bilaterally.  Accessory Clinical Findings  CBC  Recent Labs  01/25/15 1705 01/26/15 0303  WBC 7.2 7.8  HGB 10.1* 9.5*  HCT 28.9* 27.9*  MCV 84.3 85.6  PLT 139* 133*   Basic Metabolic Panel  Recent Labs  01/24/15 0812 01/24/15 1930 01/25/15 0346 01/25/15 1013  NA  --  137 136  --   K  --  4.0 4.3  --   CL  --  104 108  --   CO2  --  22 21*  --   GLUCOSE  --  122* 102*  --   BUN  --  40* 40*  --   CREATININE  --  3.84* 3.72*  --   CALCIUM  --  9.2 8.9 8.5*  MG 2.1  --   --   --  PHOS 4.0  --  5.0*  --    Liver Function Tests  Recent Labs  01/24/15 0433 01/25/15 0346  AST 29  --   ALT 22  --   ALKPHOS 56  --   BILITOT 1.3*  --   PROT 7.4  --   ALBUMIN 3.8 3.1*    Recent Labs  01/24/15 0812  LIPASE 173*   Cardiac Enzymes  Recent Labs  01/24/15 1508 01/24/15 1930 01/25/15 0346  TROPONINI 1.35* 1.98* 2.87*   Fasting Lipid Panel  Recent Labs  01/25/15 0346  CHOL 148  HDL 62  LDLCALC 76  TRIG 49  CHOLHDL 2.4   Thyroid Function Tests  Recent Labs  01/25/15 0346  TSH 1.395    TELE NSR without significant ventricular ectopy    ECG  No new EKG  Echocardiogram  pending    Radiology/Studies  Ct Abdomen Pelvis Wo Contrast  01/24/2015   CLINICAL DATA:  Hypertension and shortness of breath. Frequent urination.  EXAM: CT ABDOMEN AND PELVIS WITHOUT CONTRAST  TECHNIQUE: Multidetector CT imaging of the abdomen and pelvis was performed following the standard protocol without IV contrast.  COMPARISON:  None.  FINDINGS: BODY WALL: No contributory findings.  LOWER CHEST: Proximal left circulation coronary artery atherosclerosis.   ABDOMEN/PELVIS:  Liver: No focal abnormality.  Biliary: No evidence of biliary obstruction or stone.  Pancreas: Unremarkable.  Spleen: Unremarkable.  Adrenals: Unremarkable.  Kidneys and ureters: No hydronephrosis or stone. Fetal appearing lobulation of the bilateral kidneys. An exophytic 3 cm presumed cyst noted from the upper pole left kidney.  Bladder: Mild circumferential wall thickening, accentuated by underdistention.  Reproductive: Marked symmetric enlargement of the prostate gland, 6.5 cm in transverse dimension  Bowel: No obstruction. Negative appendix. Mild colonic diverticulosis.  Retroperitoneum: No mass or adenopathy.  Peritoneum: No ascites or pneumoperitoneum.  Vascular: No acute abnormality.  OSSEOUS: No acute abnormalities. L4-5 and L5-S1 degenerative disc narrowing and spurring.  IMPRESSION: 1. No acute intra-abdominal findings. 2. Marked prostatomegaly. 3. Colonic diverticulosis. 4. Coronary atherosclerosis.   Electronically Signed   By: Marnee Spring M.D.   On: 01/24/2015 08:29   Dg Chest 2 View  01/24/2015   CLINICAL DATA:  Shortness of Breath  EXAM: CHEST  2 VIEW  COMPARISON:  None.  FINDINGS: There is no edema or consolidation. The heart size and pulmonary vascularity are normal. No adenopathy. There is evidence of old rib trauma involving the posterior left eighth rib.  IMPRESSION: No edema or consolidation.   Electronically Signed   By: Bretta Bang III M.D.   On: 01/24/2015 07:16   US Renal  01/24/2015   CLINICAL DATA:  Acute renal failure  EXAM: RENAL / URINARY TRACT ULTRASOUND COMPLETE  COMPARISON:  None.  FINDINGS: Right Kidney:  Length: 9.4 cm. Appears echogenic with a lobular contour. No mass or hydronephrosis noted.  Left Kidney:  Length: 9.7 cm. Increased parenchymal echogenicity. Cyst within the upper pole measures 3.3 x 3.4 x 3.5 cm.  Bladder:  Appears normal for degree of bladder distention.  IMPRESSION: 1. Bilateral echogenic kidneys compatible with chronic medical  renal disease. 2. Left renal cyst.   Electronically Signed   By: Signa Kell M.D.   On: 01/24/2015 20:57    ASSESSMENT AND PLAN  1. Elevated troponin - likely demand ischemia in the setting of severe hypertension - could certainly have underlying CAD given risk factors (ST depression on ECG) - at this time would focus on medical mgt - HTN  control, and cocaine cessation. He is at high risk for sudden cardiac death with cocaine abuse. - Not cath candidate at this time, awaiting echo result, would not be surprised if EF went down some given his chronically uncontrolled HTN as evident with renal U/S, however given lack of HF symptom, does not expect EF to go down too much  2. Hypertensive emergency - still on nitro gtt - on coreg 12.5mg  BID - No ACE-I because of renal function - CT abd no abnormalities - amlodipine started yesterday, stopped for unknown reason, ?nausea. Verapmil started last night, monitor for bradycardia  - hydralazine started this morning. Need to wean off nitro gtt, potentially increase hydralazine to 25mg  q8HR if BP still high this afternoon.  3. Cocaine abuse - has tried to quit in the past - continue to encourage  4. CKD 4 - likely HTN related  - renal U/S bilateral echogenic kidneys compatible with chronic medical renal dx  5. Hematemasis with sign of GI bleed  - given h/o cocaine and ETOH bleeding, need to r/o variceal bleeding. Positive hemacult.  - GI consulted last night, to see this morning  - agree with holding IV heparin.   6. EtOH abuse  - on CIWA protocol  Signed, Amedeo PlentyMeng, Hao PA-C Pager: [phone]  Personally seen and examined. Agree with above. Mild confusion yesterday, found at foot of bed. Very pleasant. No CP, no SOB.  Main goal is BP control.  No further cardiac testing.  Cocaine cessation Will sign off. Please call if questions.   Donato SchultzSKAINS, MARK, MD

## 2015-01-26 NOTE — Progress Notes (Signed)
Pt reports complete lack of central vision, states "I can see around the edges" when asked how he gets around he states that he just feels his way around, pt also states he has no vehicle but rides a bike, when asked how he rides a bike with such poor vision he states he just is careful and looks out of the edges of his eyes, denies this being a change, states he hasn't seen well for a very long time, pt continues to alternate between being A&O  And drowsy and difficult to arouse, MD aware, will continue to monitor closely.

## 2015-01-26 NOTE — Progress Notes (Signed)
Pt complaining of SOB shortly after fluid bolus initiated, MD notified, Bolus held at present, 02sats 100%, no rhonchi noted.

## 2015-01-26 NOTE — Progress Notes (Addendum)
IMTS brief progress note  S: Called by RN Mayford KnifeWilliams at 3:50pm that patient had change in status, more lethargic.  Responded to patient's bedside with Dr Glenard HaringModing.  Patient awake and responding reasonably to questions, reports he feels generally unwell with some epigastic pain and headache.  RN reports a small blood stain on pillow which has already been cleaned.  O: Filed Vitals:   01/26/15 1555  BP: 115/79  Pulse: 57  Temp:   Resp: 13   Vitals wnl. Gen: laying flat on bed, appears unwell Heart: NSR HEENT: MMM, no appreciated blood in orophayrnx Pulm: CTAB Abd: soft, nontender Ext: perfused Neuro: decreased vision in right eye otherwise CN exam appears grossly intact, 4/5 gross strength over upper and lower extremities  A/P: Lethargy in patient we have concern for Upper GI bleed, NSTEMI, Hypertensive emergency, pancreatitis who had similar episiode previous night.  - Will obtain CBC and start 1L of NS bolus over 2 hours for possible GI bleed.  RN will monitor for further signs of gross bleeding - Concern for too quick correction of BP given he is likely chronically hypertensive, will hold his next dose of BP meds although his neurologic exam is unchanged from previous and his BP appears to have been slowly normalized over over 48 hours. - Of note patient is on BB which may block normal tachycardic response to acute blood loss. - Noted plans for EGD in AM. -Will reevaluate in 1 hour.  Gust RungErik C Hoffman, DO IMTS PGY-2 Pager: [phone]

## 2015-01-26 NOTE — Progress Notes (Addendum)
Found what appears to be a blood tinged stain  on pts pillow, pt very drowsy, wakes to physical touch but is unable to stay awake, mumbles when asked questions, but does not answer coherently,  incontinent of urine, bed alarm remains active, rails up X3, call light in reach, will continue to monitor closely. 1550 teaching service contacted. BP 115/79, Sa02 100% on room air, HR 59, resp 18.

## 2015-01-26 NOTE — Progress Notes (Signed)
Pt remains drowsy/lethargic after receiving IV phenergan earlier.  Follows commands, but unable to stay awake for more than a few minutes.  Unable to take his scheduled po meds at this time.  NTG and Heparin gtts infusing, along with NS@75  ml/hr.  Will continue to monitor.  Lee Swanson, Lee Swanson Gray

## 2015-01-26 NOTE — Progress Notes (Signed)
  Echocardiogram 2D Echocardiogram has been performed.  Delcie RochENNINGTON, Hartman Minahan 01/26/2015, 12:11 PM

## 2015-01-26 NOTE — Progress Notes (Signed)
  Date: 01/26/2015  Patient name: Armandina Stammerreston R Nevins  Medical record number: [id number]  Date of birth: 02-18-1951   This patient has been seen and the plan of care was discussed with the house staff. Please see their note for complete details. I concur with their findings with the following additions/corrections: Mr Steva ColderMottley had a rough night.  1. UGI bleed - on PPI. HgB slow trend down and likely multifactorial (GI blood loss and renal dz and vol contraction on admit). Gi eval pt. Clears only in case needs EGD. Heparin gtt stopped.  2. NSTEMI - on BB, ASA. Relative CI to statin 2/2 pancreatitis. Heparin off for GI bleed. No CP. Medical mgmt. Cards signed off.  3. Malignant HTN - improvement. Need now is to get on affordable, fewest # doses per day BP regimen. Verapamil had been selected for antiproteinuric effects. Also on hydral, and coreg.  4. HA - now points to mid forehead. No tenderness over L temple and vessel. Eyesight unchanged. Outpt optho appt.  5. Chronic kidney disease - follow Cr. Control HTN. No cocaine. Outpt renal F/U.   Burns SpainElizabeth A Butcher, MD 01/26/2015, 11:26 AM

## 2015-01-27 ENCOUNTER — Inpatient Hospital Stay (HOSPITAL_COMMUNITY): Payer: Medicaid Other | Admitting: Anesthesiology

## 2015-01-27 ENCOUNTER — Encounter (HOSPITAL_COMMUNITY)
Admission: EM | Disposition: A | Discharge status: Home / Self Care | Payer: Self-pay | Source: Home / Self Care | Attending: Internal Medicine

## 2015-01-27 ENCOUNTER — Encounter (HOSPITAL_COMMUNITY): Payer: Self-pay | Admitting: *Deleted

## 2015-01-27 DIAGNOSIS — Z2252 Carrier of viral hepatitis C: Secondary | ICD-10-CM

## 2015-01-27 DIAGNOSIS — Z9889 Other specified postprocedural states: Secondary | ICD-10-CM

## 2015-01-27 DIAGNOSIS — D631 Anemia in chronic kidney disease: Secondary | ICD-10-CM

## 2015-01-27 DIAGNOSIS — R0602 Shortness of breath: Secondary | ICD-10-CM | POA: Insufficient documentation

## 2015-01-27 HISTORY — PX: ESOPHAGOGASTRODUODENOSCOPY: SHX5428

## 2015-01-27 LAB — FOLATE: FOLATE: 17.7 ng/mL (ref >5.9)

## 2015-01-27 LAB — FERRITIN: Ferritin: 381 ng/mL — ABNORMAL HIGH (ref 24–336)

## 2015-01-27 LAB — VITAMIN B12: VITAMIN B 12: 368 pg/mL (ref 180–914)

## 2015-01-27 LAB — IRON AND TIBC
Iron: <5 ug/dL — ABNORMAL LOW (ref 45–182)
TIBC: 290 ug/dL (ref 250–450)

## 2015-01-27 LAB — VITAMIN D 1,25 DIHYDROXY
VITAMIN D3 1, 25 (OH): 39 pg/mL
Vitamin D 1, 25 (OH)2 Total: 39 pg/mL
Vitamin D2 1, 25 (OH)2: <10 pg/mL

## 2015-01-27 LAB — HEPARIN LEVEL (UNFRACTIONATED): Heparin Unfractionated: <0.1 IU/mL — ABNORMAL LOW (ref 0.30–0.70)

## 2015-01-27 SURGERY — COLONOSCOPY
Anesthesia: General

## 2015-01-27 SURGERY — EGD (ESOPHAGOGASTRODUODENOSCOPY)
Anesthesia: General

## 2015-01-27 MED ORDER — PROPOFOL 10 MG/ML IV BOLUS
INTRAVENOUS | Status: DC | PRN
  Administered 2015-01-27: 150 mg via INTRAVENOUS

## 2015-01-27 MED ORDER — PEG 3350-KCL-NA BICARB-NACL 420 G PO SOLR
2000.0000 mL | Freq: Once | ORAL | Status: AC
  Administered 2015-01-27: 2000 mL via ORAL
  Filled 2015-01-27: qty 4000

## 2015-01-27 MED ORDER — PEG 3350-KCL-NA BICARB-NACL 420 G PO SOLR
2000.0000 mL | Freq: Once | ORAL | Status: AC
  Administered 2015-01-27: 2000 mL via ORAL

## 2015-01-27 MED ORDER — VERAPAMIL HCL 120 MG PO TABS
120.0000 mg | ORAL_TABLET | Freq: Three times a day (TID) | ORAL | Status: DC
  Administered 2015-01-27 – 2015-01-29 (×6): 120 mg via ORAL
  Filled 2015-01-27 (×9): qty 1

## 2015-01-27 MED ORDER — SUCCINYLCHOLINE CHLORIDE 20 MG/ML IJ SOLN
INTRAMUSCULAR | Status: DC | PRN
  Administered 2015-01-27: 100 mg via INTRAVENOUS

## 2015-01-27 MED ORDER — CARVEDILOL 6.25 MG PO TABS
6.2500 mg | ORAL_TABLET | Freq: Two times a day (BID) | ORAL | Status: DC
  Administered 2015-01-27 – 2015-01-29 (×3): 6.25 mg via ORAL
  Filled 2015-01-27 (×4): qty 1

## 2015-01-27 MED ORDER — METOCLOPRAMIDE HCL 5 MG/ML IJ SOLN
10.0000 mg | Freq: Once | INTRAMUSCULAR | Status: AC
  Administered 2015-01-27: 10 mg via INTRAVENOUS
  Filled 2015-01-27: qty 2

## 2015-01-27 MED ORDER — LIDOCAINE HCL (CARDIAC) 20 MG/ML IV SOLN
INTRAVENOUS | Status: DC | PRN
  Administered 2015-01-27: 50 mg via INTRAVENOUS

## 2015-01-27 MED ORDER — LACTATED RINGERS IV SOLN: INTRAVENOUS | Status: DC | PRN

## 2015-01-27 MED ORDER — BISACODYL 5 MG PO TBEC
20.0000 mg | DELAYED_RELEASE_TABLET | Freq: Once | ORAL | Status: AC
  Administered 2015-01-27: 20 mg via ORAL
  Filled 2015-01-27: qty 4

## 2015-01-27 NOTE — Progress Notes (Signed)
RN asking orientation questions and patient answering, I don't know.  RN encouraged patient to please answer questions and with RN encouragement, patient complied and answered all orientation questions appropriately.

## 2015-01-27 NOTE — Clinical Social Work Note (Addendum)
Clinical Social Work Assessment  Patient Details  Name: Lee Swanson MRN: [id number] Date of Birth: [birth year]  Date of referral:  01/27/15               Reason for consult:  Substance Use/ETOH Abuse                Permission sought to share information with:  Other (NA) Permission granted to share information::  No       Housing/Transportation Living arrangements for the past 2 months:  [facility] Source of Information:  Patient Patient Interpreter Needed:  None Criminal Activity/Legal Involvement Pertinent to Current Situation/Hospitalization:  No - Comment as needed Significant Relationships:  Other(Comment) (Den[place] any support systems in [place]. Knows other's at the boarding home where he lives.) Lives with: Boarding House   Do you feel safe going back to the place where you live?  Yes Need for family participation in patient care:  No (Coment)  Care giving concerns:  None noted by patient.  "Feels better"   Social Worker assessment / plan:  64 year old male- admitted from home (lives in a boarding home). States that he is normally self-sufficient of his ADLS's and is was surprised when he had to come into the hospital.  He underwent an EGD today and states he will have a Colonoscopy tomorrow. He states he has no family or friends in [place]. Uses the bus system to get navigate around the city. Patient is very anxious about trying to get Medicaid and disability.  He states he has an appointment to do a telephone interview with Social Security in June.  Patient indicated that a Medicaid application has been initiated with [employer].  He denies. any concerns about his alcohol usage and also denies regular use of cocaine. SBIRT completed. CSW signing off as patient declines Substance abuse treatment options.  Does not feel that he has an issue with his usage.   Employment status:  Part-Time (Is working "part time" but trying to get Medicaid and  disability) Insurance information:  Self Pay (Medicaid Pending) PT Recommendations:  Not assessed at this time Information / Referral to community resources:  Outpatient Substance Abuse Treatment Options (Substance abuse treatment services offered but declined.)  Patient/Family's Response to care: " I came into the hospital because my stomach was upset and I didn'Swanson know why"  Patient feels that he is receiving appropriate tests to try to determine what is wrong.  Patient/Family's Understanding of and Emotional Response to Diagnosis, Current Treatment, and Prognosis:  Patient stated he was feeling better and verbalized that he is hopeful to get finished with his testing.  He feels that the MD's and RN's have been very responsive to his concerns and needs and that they are doing everything possible to feel better. He is anxious to go home as soon as he is better.  Emotional Assessment Appearance:  Appears younger than stated age Attitude/Demeanor/Rapport:  Other (Very positive attitude, friendly and welcoming to CSW's visit) Affect (typically observed):  Calm, Happy, Appropriate, Hopeful Orientation:  Oriented to Self, Oriented to Place, Oriented to  Time, Oriented to Situation Alcohol / Substance use:  Tobacco Use, Alcohol Use, Illicit Drugs (Positive for cocaine but states only used "one time".  Positive on admssion) Psych involvement (Current and /or in the community):  No (Comment)  Discharge Needs  Concerns to be addressed:  Financial / Insurance Concerns, Substance Abuse Concerns Readmission within the last 30 days:  No Current  discharge risk:  Substance Abuse, Lack of support system Barriers to Discharge:  No Barriers Identified   Lee Swanson, Lee Orvis Swanson, Lee Swanson 01/27/2015, 4:50 PM [phone]

## 2015-01-27 NOTE — Progress Notes (Signed)
  Date: 01/27/2015  Patient name: Lee Swanson  Medical record number: [id number]  Date of birth: 1951-06-09   This patient has been seen and the plan of care was discussed with the house staff. Please see their note for complete details. I concur with their findings with the following additions/corrections: Mr Steva ColderMottley was seen with Drs Beckie Saltsivet and Mikey BussingHoffman. He was lying in bed, s/p EGD. He has no CP, HA. He had confusion, lethargy, and gen unwell feeling last PM, thought to be 2/2 BP too low in man with long standing HTN so BP meds adjusted. BP as low as 115/79, now more appropriately is 187/96. On Coreg 6.25, verapamil 120 TID. Hydral held. EGD results in chart - ? BE. Bx pending. For colon tomorrow. Of note, Hep C Ab +, VL pending.  Burns SpainElizabeth A Jaidynn Balster, MD 01/27/2015, 1:27 PM

## 2015-01-27 NOTE — Anesthesia Postprocedure Evaluation (Signed)
  Anesthesia Post-op Note  Patient: Lee Swanson  Procedure(s) Performed: Procedure(s): ESOPHAGOGASTRODUODENOSCOPY (EGD) (N/A)  Patient Location: PACU  Anesthesia Type:General  Level of Consciousness: awake, alert , oriented and patient cooperative  Airway and Oxygen Therapy: Patient Spontanous Breathing  Post-op Pain: none  Post-op Assessment: Post-op Vital signs reviewed, Patient's Cardiovascular Status Stable, Respiratory Function Stable, Patent Airway, No signs of Nausea or vomiting and Pain level controlled  Post-op Vital Signs: stable  Last Vitals:  Filed Vitals:   01/27/15 0918  BP: 168/95  Pulse: 58  Temp:   Resp: 17    Complications: No apparent anesthesia complications

## 2015-01-27 NOTE — Anesthesia Procedure Notes (Signed)
Procedure Name: Intubation Date/Time: 01/27/2015 8:17 AM Performed by: Eligha Bridegroom Pre-anesthesia Checklist: Patient identified, Emergency Drugs available, Suction available, Patient being monitored and Timeout performed Patient Re-evaluated:Patient Re-evaluated prior to inductionOxygen Delivery Method: Circle system utilized Preoxygenation: Pre-oxygenation with 100% oxygen Intubation Type: IV induction Laryngoscope Size: Mac and 4 Grade View: Grade I Tube type: Oral Tube size: 7.5 mm Number of attempts: 1 Airway Equipment and Method: Stylet and LTA kit utilized Placement Confirmation: ETT inserted through vocal cords under direct vision,  positive ETCO2,  CO2 detector and breath sounds checked- equal and bilateral Secured at: 21 cm Tube secured with: Tape Dental Injury: Teeth and Oropharynx as per pre-operative assessment

## 2015-01-27 NOTE — Transfer of Care (Signed)
Immediate Anesthesia Transfer of Care Note  Patient: Lee Swanson R Presutti  Procedure(s) Performed: Procedure(s): ESOPHAGOGASTRODUODENOSCOPY (EGD) (N/A)  Patient Location: PACU  Anesthesia Type:General  Level of Consciousness: awake, alert  and oriented  Airway & Oxygen Therapy: Patient Spontanous Breathing  Post-op Assessment: Report given to RN  Post vital signs: Reviewed and stable  Last Vitals:  Filed Vitals:   01/27/15 0738  BP: 167/113  Pulse: 63  Temp: 36.8 C  Resp: 19    Complications: No apparent anesthesia complications

## 2015-01-27 NOTE — Op Note (Signed)
[facility] [address] [place], [id number]   ENDOSCOPY PROCEDURE REPORT  PATIENT: Lee Swanson, Lee Swanson  MR#: [id number] BIRTHDATE: 1951-06-11 , 63  yrs. old GENDER: male ENDOSCOPIST: Iva Booparl E Lonnel Gjerde, MD, [facility] PROCEDURE DATE:  01/27/2015 PROCEDURE:  EGD w/ biopsy ASA CLASS:     Class III INDICATIONS:  hematemesis and dysphagia. MEDICATIONS: Per Anesthesia TOPICAL ANESTHETIC: none  DESCRIPTION OF PROCEDURE: After the risks benefits and alternatives of the procedure were thoroughly explained, informed consent was obtained.  The Pentax Gastroscope [id number] endoscope was introduced through the mouth and advanced to the second portion of the duodenum , Without limitations.  The instrument was slowly withdrawn as the mucosa was fully examined.    1) Three 1-2 cm columnar tongues - biopsies to look for intestinal metaplasia/Barrett's.  38-40 cm in distal esophagus  2) White plaques mid esophagus - ? cause - biopsied - do not look malignant 3) Otherwise normal - no cause of dysphagia seen.  That could be related to poor dentition.  Retroflexed views revealed no abnormalities.     The scope was then withdrawn from the patient and the procedure completed.  COMPLICATIONS: There were no immediate complications.  ENDOSCOPIC IMPRESSION: 1) Three 1-2 cm columnar tongues - biopsies to look for intestinal metaplasia/Barrett's.  38-40 cm in distal esophagus 2) White plaques mid esophagus - ? cause - biopsied - do not look malignant 3) Otherwise normal - no cause of dysphagia seen.  That could be related to poor dentition  RECOMMENDATIONS: Await pathology Colonoscopy to further investigate heme + anemia - tomorrow    eSigned:  Iva Booparl E Jaleiyah Alas, MD, [facility] 01/27/2015 8:54 AM

## 2015-01-27 NOTE — Progress Notes (Signed)
RN entered room to sounding bed alarm.  Bed alarm sounding because patient had leaned over side of bed to set used urinal on the floor.  RN encouraged patient to drink some NuLYTELY at this time.  Patient stated he would drink some now then proceeded to cover his face with the blanket and close his eyes.

## 2015-01-27 NOTE — Progress Notes (Signed)
RN attempting to complete assessment and patient uncooperative.  RN asked patient the year and patient replied I don't know ask my mother.  RN then stated to patient what's the year this year you've told me twice before, can you please tell me the year again.  Patient replied I don't know.  RN explained to patient the year was 2016.  RN then asked patient if he could tell RN where he was right now and patient replied I don't know.  RN asked patient do you really not know where you are or do you just not want to answer and patient replied I don't want to answer.

## 2015-01-27 NOTE — Progress Notes (Signed)
Subjective: Patient noted to have some confusion, increased blurry vision in his right eye, and shortness of breath yesterday afternoon. He was evaluated by our team and noted to be back at baseline.   This morning he went for EGD. He notes his headache has improved. He denies chest pain, abdominal pain, nausea, vomiting, and dark stools. Nurse reports the outside of his left ear was bleeding. Appears his earring got caught on something and caused a tear on the lower part of the ear. Patient denies any associated pain.   Objective: Vital signs in last 24 hours: Filed Vitals:   01/27/15 0855 01/27/15 0900 01/27/15 0910 01/27/15 0918  BP: 162/97 156/94 170/96 168/95  Pulse: 58 60 58 58  Temp:      TempSrc:      Resp: 19 16 16 17   Height:      Weight:      SpO2: 99% 97% 100% 97%   Weight change: 2 lb 8.7 oz (1.154 kg)  Intake/Output Summary (Last 24 hours) at 01/27/15 0930 Last data filed at 01/27/15 0850  Gross per 24 hour  Intake    880 ml  Output   1080 ml  Net   -200 ml   Physical Exam General: thin appearing man resting in bed, pleasant, NAD HEENT: [place]/AT, EOMI. Left ear has a large tear at the bottom, tip of ear almost completely severed from rest of ear. There is dried blood around the area of his earring and on the bottom part of the ear. Mucus membranes moist CV: RRR, no m/g/r Pulm: CTA bilaterally, breaths non-labored Abd: BS+, soft, non-tender, non-distended Ext: warm, no edema Neuro: alert and oriented x 3, no focal deficits   Lab Results: Basic Metabolic Panel:  Recent Labs Lab 01/24/15 0812 01/24/15 1930 01/25/15 0346 01/25/15 1013  NA  --  137 136  --   K  --  4.0 4.3  --   CL  --  104 108  --   CO2  --  22 21*  --   GLUCOSE  --  122* 102*  --   BUN  --  40* 40*  --   CREATININE  --  3.84* 3.72*  --   CALCIUM  --  9.2 8.9 8.5*  MG 2.1  --   --   --   PHOS 4.0  --  5.0*  --    Liver Function Tests:  Recent Labs Lab 01/24/15 0433  01/25/15 0346  AST 29  --   ALT 22  --   ALKPHOS 56  --   BILITOT 1.3*  --   PROT 7.4  --   ALBUMIN 3.8 3.1*    Recent Labs Lab 01/24/15 0812  LIPASE 173*  CBC:  Recent Labs Lab 01/26/15 0303 01/26/15 1650  WBC 7.8 5.3  HGB 9.5* 9.1*  HCT 27.9* 26.7*  MCV 85.6 85.6  PLT 133* 121*   Cardiac Enzymes:  Recent Labs Lab 01/24/15 1508 01/24/15 1930 01/25/15 0346  TROPONINI 1.35* 1.98* 2.87*   CBG:  Recent Labs Lab 01/24/15 0426  GLUCAP 98   Fasting Lipid Panel:  Recent Labs Lab 01/25/15 0346  CHOL 148  HDL 62  LDLCALC 76  TRIG 49  CHOLHDL 2.4   Thyroid Function Tests:  Recent Labs Lab 01/25/15 0346  TSH 1.395   Coagulation:  Recent Labs Lab 01/24/15 0812  LABPROT 13.1  INR 0.98   Anemia Panel:  Recent Labs Lab 01/27/15 0410  VITAMINB12 368  FOLATE 17.7  FERRITIN 381*  TIBC 290  IRON <5*   Urine Drug Screen: Drugs of Abuse     Component Value Date/Time   LABOPIA NONE DETECTED 01/24/2015 0528   COCAINSCRNUR POSITIVE* 01/24/2015 0528   LABBENZ NONE DETECTED 01/24/2015 0528   AMPHETMU NONE DETECTED 01/24/2015 0528   THCU NONE DETECTED 01/24/2015 0528   LABBARB NONE DETECTED 01/24/2015 0528    Alcohol Level:  Recent Labs Lab 01/24/15 0812  ETH <5   Urinalysis:  Recent Labs Lab 01/24/15 0758  COLORURINE YELLOW  LABSPEC 1.010  PHURINE 5.5  GLUCOSEU NEGATIVE  HGBUR SMALL*  BILIRUBINUR NEGATIVE  KETONESUR NEGATIVE  PROTEINUR >300*  UROBILINOGEN 1.0  NITRITE NEGATIVE  LEUKOCYTESUR TRACE*    Studies/Results: Ct Head Wo Contrast  01/26/2015   CLINICAL DATA:  Altered mental status. Blurred vision. Hypertension.  EXAM: CT HEAD WITHOUT CONTRAST  TECHNIQUE: Contiguous axial images were obtained from the base of the skull through the vertex without intravenous contrast.  COMPARISON:  None.  FINDINGS: There is no evidence of intracranial hemorrhage, brain edema, or other signs of acute cortical infarction. There is no  evidence of intracranial mass lesion or mass effect. No abnormal extraaxial fluid collections are identified.  Extensive chronic small vessel disease is demonstrated as well as mild cerebral atrophy. Somewhat ill low-attenuation areas are also noted in the right thalamus and basal ganglia, which may represent subacute or chronic lacunar infarcts. No evidence hydrocephalus. No skull abnormality identified.  IMPRESSION: Age indeterminate right thalamic and basal ganglia lacunar infarcts, which may be subacute or chronic in age. Consider brain MRI for further evaluation if clinically warranted.  Mild cerebral atrophy and extensive chronic small vessel disease.   Electronically Signed   By: Myles Rosenthal M.D.   On: 01/26/2015 20:24   Dg Chest Port 1 View  01/26/2015   CLINICAL DATA:  Shortness of breath since yesterday.  EXAM: PORTABLE CHEST - 1 VIEW  COMPARISON:  01/24/2015  FINDINGS: Small area of hazy airspace disease in the left lower lobe which may reflect atelectasis versus developing infiltrate. There is no other focal parenchymal opacity. There is no pleural effusion or pneumothorax. The heart and mediastinum are stable. There is no acute osseous abnormality.  IMPRESSION: Small areas hazy airspace ease and left lower lobe which may reflect atelectasis versus developing infiltrate.   Electronically Signed   By: Elige Ko   On: 01/26/2015 19:47   Medications: I have reviewed the patient's current medications.  Assessment/Plan:  NSTEMI: On admission, troponins elevated up to 2.8 and EKG with ST depressions in lateral leads. Patient was on heparin gtt for day and a half but then had upper GI bleeding (see below) so drip was stopped. No longer having chest pain. BP improved to 110s-120s systolic yesterday but patient became symptomatic from drop in blood pressure from previously high baseline. Hydralazine was held. SBP now in 150s-160s. Nitro gtt is off. Patient's HR on brady side this morning with HR in  53-58 range.  - Decrease Coreg to 6.25 mg BID, can consider titrating back up if BP/HR tolerates  - Continue Verapamil 80 mg TID, increase as BP tolerates  - Hold Hydralazine for now, consider adding back if BP tolerates or increasing verapamil instead   - f/u echo >> EF 55-60%, no wall motion abnormalities, mild aortic regurg - Nitro PRN - Zofran PRN nausea - Cardiac monitoring   Upper GI Bleed: Patient had single episode of hematemesis on 5/10. Also noted to have  dark stool and FOBT positive. GI was consulted and performed EGD this morning. EGD was suspicious for possible Barrett's esophagus, biopsies taken. Also noted to have white plaques in mid-esophagus, biopsies taken. GI to do colonoscopy tomorrow AM. Patient has not had any additional episodes of hematemesis and Hbg stable at 9.1.  - GI following, appreciate recommendations - f/u EGD biopsies - Colonoscopy tomorrow, NPO at midnight  - Continue Protonix 40 mg BID   Hypertensive Emergency: Yesterday patient's SBP dropped to 110s-120s and he became symptomatic. Hydralazine was held. Nitro gtt off. CT Head was ordered because of concern for cerebrovascular stenosis. CT showed chronic lacunar infarcts, but no acute changes. Likely his symptoms were from dysregulation from chronic HTN. SBP in 150s-160s this morning. Verapamil was continued. Coreg was titrated down as he was on brady side.  - Continue Verapamil 80 mg TID, can increase as BP tolerates - Hold hydralazine for now, can consider adding back as BP tolerates or go up on verapamil  - Decrease Coreg to 6.25 mg BID  CKD Stage 4: Cr 4.17 on admission, has now improved to 3.72 after IVF. He has underlying CKD from likely chronic uncontrolled HTN. Renal US is consistent with chronic medical disease, no obstruction present. He had a 3.3 x 3.4 x 2.5 cm cyst on the left kidney as well. He is urinating well. PTH elevated at 80 with low calcium and high phosphorus consistent with secondary  hyperparathyroidism.  - bmet in AM - Continue Renvela 800 mg TID - Will need outpatient renal follow up   Acute Pancreatitis: Abdominal pain, nausea, and vomiting improved. Patient is tolerating clear liquids/soft foods.  - Continue clears/soft diet as tolerated  - NPO at midnight for colonoscopy  - Zofran PRN nausea   Positive Hep C Antibody: HCV antibody positive. Will check RNA to determine if current or previous infection.  - f/u HCV RNA   Vitamin D Deficiency: Vit D level noted to be low at 13.0 on admission. Patient will need Vitamin D supplementation with Ergocalciferol 50,000 units Qweekly for 8 weeks and then have levels rechecked. - Continue Ergocalciferol 50,000 units Qweekly - Will need outpatient labs in 8 weeks   Anemia of Chronic Disease: Hbg 13.6 on admission, likely hemoconcentrated. Hbg has been stable in 10s, but then had hematemesis episode as noted above. Hbg still stable at 9.1. Anemia panel shows low iron (<5), normal TIBC (290), high ferritin (381), and normal vit B12 and folate most consistent with anemia of chronic disease.  - CBC tomorrow AM   Weight Loss: Patient states he has noticed weight loss within the last month. He is unsure how long ago the weight loss started. He reports he used to weigh between 140-150 lbs. His admission weight was 109 lbs, now starting to regain some weight at 115 lbs. His Hbg has been low in 9-10 range (low prior to hematemesis episode). His Hbg on admission was 13.6 but this was likely hemoconcentrated. He is a smoker placing him at higher risk for malignancy. CXR did not show evidence of any nodules or masses. CT Abd/Pelvis remarkable for diverticulosis and prostatomegaly, no colon masses. Prostate cancer is a possibility for his weight loss. He states his mother died from colon cancer. HIV antibody is non-reactive. EGD concerning for possible Barrett's esophagus. Patient will need further work up for possible malignancy as an  outpatient.  - Colonoscopy tomorrow - f/u EGD biopsies   Thrombocytopenia: Platelets 148 on admission, latest 121. Likely secondary to chronic  alcohol abuse.  - Continue to monitor   Substance Abuse: Patient reports drinking alcohol daily and tobacco use daily (1 ppd). He did not admit to cocaine use at first but then admitted to cocaine use 4 days prior to admission after he was told UDS results. Patient was encouraged to stop using these substances, especially the cocaine as this places him at risk for sudden cardiac death.  - Continue to encourage substance cessation  BPH: Patient reports difficulty starting a stream. BPH noted on CT Abd/Pelvis.  - Consider starting Flomax when tolerating PO intake better   Malnutrition: Patient with significant weight loss as noted above. Appears malnourished on exam.  - Nutrition following, appreciate recommendations - Continue Boost TID - Encouraged to ambulate halls with nurse today  Diet: Soft, NPO at midnight for colonoscopy tomorrow  VTE PPx: SCDs Dispo: Disposition is deferred at this time, awaiting improvement of current medical problems.  Anticipated discharge in approximately 1-2 day(s).   The patient does not have a current PCP (No primary care provider on file.) and [facility] after discharge.  The patient does not have transportation limitations that hinder transportation to clinic appointments.  .Services Needed at time of discharge: Y = Yes, Blank = No PT:   OT:   RN:   Equipment:   Other:     LOS: 3 days   Su Hoffarly J Rivet, MD 01/27/2015, 9:30 AM

## 2015-01-27 NOTE — Progress Notes (Signed)
RN encouraging patient to drink the NuLYTELY, patient stated he would drink some later.  RN provided education on importance and purpose of drinking the medication and patient continued to stated he would drink some later he was sleepy now.  Will continue to provide encouragement.

## 2015-01-27 NOTE — Anesthesia Preprocedure Evaluation (Signed)
Anesthesia Evaluation  Patient identified by MRN, date of birth, ID band Patient awake    Reviewed: Allergy & Precautions, NPO status , Patient's Chart, lab work & pertinent test results  Airway Mallampati: I       Dental   Pulmonary Current Smoker,    Pulmonary exam normal       Cardiovascular hypertension, Normal cardiovascular exam    Neuro/Psych  Headaches,    GI/Hepatic (+)     substance abuse  alcohol use, cocaine use and IV drug use,   Endo/Other    Renal/GU CRFRenal disease     Musculoskeletal   Abdominal   Peds  Hematology   Anesthesia Other Findings   Reproductive/Obstetrics                             Anesthesia Physical Anesthesia Plan  ASA: IV  Anesthesia Plan: General   Post-op Pain Management:    Induction: Intravenous  Airway Management Planned: Oral ETT  Additional Equipment:   Intra-op Plan:   Post-operative Plan: Extubation in OR  Informed Consent: I have reviewed the patients History and Physical, chart, labs and discussed the procedure including the risks, benefits and alternatives for the proposed anesthesia with the patient or authorized representative who has indicated his/her understanding and acceptance.     Plan Discussed with: CRNA, Anesthesiologist and Surgeon  Anesthesia Plan Comments:         Anesthesia Quick Evaluation

## 2015-01-27 NOTE — Progress Notes (Signed)
RN spoke with Dr. Loma NewtonNgo on call for internal medicine pertaining to RN progress notes charted at 2221 and 2228.  No new orders received at this time.  RN will continue to encourage patient to drink NuLYTLEY.

## 2015-01-28 ENCOUNTER — Encounter (HOSPITAL_COMMUNITY)
Admission: EM | Disposition: A | Discharge status: Home / Self Care | Payer: Self-pay | Source: Home / Self Care | Attending: Internal Medicine

## 2015-01-28 ENCOUNTER — Encounter (HOSPITAL_COMMUNITY): Payer: Self-pay | Admitting: Internal Medicine

## 2015-01-28 DIAGNOSIS — R195 Other fecal abnormalities: Secondary | ICD-10-CM | POA: Insufficient documentation

## 2015-01-28 LAB — CBC
HEMATOCRIT: 26.1 % — AB (ref 39.0–52.0)
Hemoglobin: 8.7 g/dL — ABNORMAL LOW (ref 13.0–17.0)
MCH: 28.4 pg (ref 26.0–34.0)
MCHC: 33.3 g/dL (ref 30.0–36.0)
MCV: 85.3 fL (ref 78.0–100.0)
Platelets: 148 10*3/uL — ABNORMAL LOW (ref 150–400)
RBC: 3.06 MIL/uL — ABNORMAL LOW (ref 4.22–5.81)
RDW: 13.3 % (ref 11.5–15.5)
WBC: 9.8 10*3/uL (ref 4.0–10.5)

## 2015-01-28 LAB — BASIC METABOLIC PANEL
Anion gap: 9 (ref 5–15)
BUN: 41 mg/dL — ABNORMAL HIGH (ref 6–20)
CO2: 22 mmol/L (ref 22–32)
CREATININE: 3.8 mg/dL — AB (ref 0.61–1.24)
Calcium: 8.4 mg/dL — ABNORMAL LOW (ref 8.9–10.3)
Chloride: 103 mmol/L (ref 101–111)
GFR calc Af Amer: 18 mL/min — ABNORMAL LOW (ref >60)
GFR, EST NON AFRICAN AMERICAN: 15 mL/min — AB (ref >60)
GLUCOSE: 81 mg/dL (ref 65–99)
POTASSIUM: 3 mmol/L — AB (ref 3.5–5.1)
Sodium: 134 mmol/L — ABNORMAL LOW (ref 135–145)

## 2015-01-28 SURGERY — COLONOSCOPY WITH PROPOFOL
Anesthesia: Monitor Anesthesia Care

## 2015-01-28 SURGERY — COLONOSCOPY
Anesthesia: Moderate Sedation

## 2015-01-28 MED ORDER — METOCLOPRAMIDE HCL 5 MG/ML IJ SOLN
10.0000 mg | Freq: Once | INTRAMUSCULAR | Status: AC
  Administered 2015-01-28: 10 mg via INTRAVENOUS
  Filled 2015-01-28: qty 2

## 2015-01-28 MED ORDER — PEG 3350-KCL-NA BICARB-NACL 420 G PO SOLR
2000.0000 mL | Freq: Once | ORAL | Status: AC
  Administered 2015-01-28: 2000 mL via ORAL

## 2015-01-28 MED ORDER — PEG 3350-KCL-NA BICARB-NACL 420 G PO SOLR
2000.0000 mL | Freq: Once | ORAL | Status: DC
  Filled 2015-01-28: qty 4000

## 2015-01-28 MED ORDER — POTASSIUM CHLORIDE CRYS ER 20 MEQ PO TBCR
40.0000 meq | EXTENDED_RELEASE_TABLET | Freq: Once | ORAL | Status: AC
  Administered 2015-01-28: 40 meq via ORAL
  Filled 2015-01-28: qty 2

## 2015-01-28 MED ORDER — METOCLOPRAMIDE HCL 5 MG/ML IJ SOLN
10.0000 mg | Freq: Once | INTRAMUSCULAR | Status: AC
  Administered 2015-01-29: 10 mg via INTRAVENOUS
  Filled 2015-01-28: qty 2

## 2015-01-28 NOTE — Progress Notes (Signed)
Pt has drank 2 240oz of GoLYTELY around 2130. When asked if I could pour him another he refused. Will continue to encourage compliance with bowel prep.

## 2015-01-28 NOTE — Progress Notes (Signed)
Subjective: No acute events overnight. Patient reports some nausea, but otherwise feels good today. Patient was not able to drink all of prep for colonoscopy so will be deferred until possibly tomorrow. He denies chest pain, abdominal pain, headache, dark stools.   Objective: Vital signs in last 24 hours: Filed Vitals:   01/27/15 1622 01/27/15 2013 01/28/15 0002 01/28/15 0355  BP: 142/87 166/97  161/87  Pulse: 64 66  62  Temp: 98.2 F (36.8 C) 98.8 F (37.1 C) 98.6 F (37 C) 98.6 F (37 C)  TempSrc: Oral Oral Oral Oral  Resp: Height:      Weight:    116 lb (52.617 kg)  SpO2: 98% 100%  99%   Weight change: 12.8 oz (0.363 kg)  Intake/Output Summary (Last 24 hours) at 01/28/15 0803 Last data filed at 01/28/15 0533  Gross per 24 hour  Intake   1240 ml  Output   1275 ml  Net    -35 ml   Physical Exam General: thin appearing man resting in bed, pleasant, NAD HEENT: [place]/AT, EOMI. Left ear has a large tear at the bottom, tip of ear almost completely severed from rest of ear. No active bleeding or tenderness. Mucus membranes moist CV: RRR, no m/g/r Pulm: CTA bilaterally, breaths non-labored Abd: BS+, soft, non-tender, non-distended Ext: warm, no edema Neuro: alert and oriented x 3, no focal deficits   Lab Results: Basic Metabolic Panel:  Recent Labs Lab 01/24/15 0812  01/25/15 0346 01/25/15 1013 01/28/15 0258  NA  --   < > 136  --  134*  K  --   < > 4.3  --  3.0*  CL  --   < > 108  --  103  CO2  --   < > 21*  --  22  GLUCOSE  --   < > 102*  --  81  BUN  --   < > 40*  --  41*  CREATININE  --   < > 3.72*  --  3.80*  CALCIUM  --   < > 8.9 8.5* 8.4*  MG 2.1  --   --   --   --   PHOS 4.0  --  5.0*  --   --   < > = values in this interval not displayed. Liver Function Tests:  Recent Labs Lab 01/24/15 0433 01/25/15 0346  AST 29  --   ALT 22  --   ALKPHOS 56  --   BILITOT 1.3*  --   PROT 7.4  --   ALBUMIN 3.8 3.1*    Recent Labs Lab  01/24/15 0812  LIPASE 173*   CBC:  Recent Labs Lab 01/26/15 1650 01/28/15 0258  WBC 5.3 9.8  HGB 9.1* 8.7*  HCT 26.7* 26.1*  MCV 85.6 85.3  PLT 121* 148*   Cardiac Enzymes:  Recent Labs Lab 01/24/15 1508 01/24/15 1930 01/25/15 0346  TROPONINI 1.35* 1.98* 2.87*   CBG:  Recent Labs Lab 01/24/15 0426  GLUCAP 98   Fasting Lipid Panel:  Recent Labs Lab 01/25/15 0346  CHOL 148  HDL 62  LDLCALC 76  TRIG 49  CHOLHDL 2.4   Thyroid Function Tests:  Recent Labs Lab 01/25/15 0346  TSH 1.395   Coagulation:  Recent Labs Lab 01/24/15 0812  LABPROT 13.1  INR 0.98   Anemia Panel:  Recent Labs Lab 01/27/15 0410  VITAMINB12 368  FOLATE 17.7  FERRITIN 381*  TIBC 290  IRON <5*   Urine Drug Screen: Drugs of Abuse     Component Value Date/Time   LABOPIA NONE DETECTED 01/24/2015 0528   COCAINSCRNUR POSITIVE* 01/24/2015 0528   LABBENZ NONE DETECTED 01/24/2015 0528   AMPHETMU NONE DETECTED 01/24/2015 0528   THCU NONE DETECTED 01/24/2015 0528   LABBARB NONE DETECTED 01/24/2015 0528    Alcohol Level:  Recent Labs Lab 01/24/15 0812  ETH <5   Urinalysis:  Recent Labs Lab 01/24/15 0758  COLORURINE YELLOW  LABSPEC 1.010  PHURINE 5.5  GLUCOSEU NEGATIVE  HGBUR SMALL*  BILIRUBINUR NEGATIVE  KETONESUR NEGATIVE  PROTEINUR >300*  UROBILINOGEN 1.0  NITRITE NEGATIVE  LEUKOCYTESUR TRACE*    Studies/Results: Ct Head Wo Contrast  01/26/2015   CLINICAL DATA:  Altered mental status. Blurred vision. Hypertension.  EXAM: CT HEAD WITHOUT CONTRAST  TECHNIQUE: Contiguous axial images were obtained from the base of the skull through the vertex without intravenous contrast.  COMPARISON:  None.  FINDINGS: There is no evidence of intracranial hemorrhage, brain edema, or other signs of acute cortical infarction. There is no evidence of intracranial mass lesion or mass effect. No abnormal extraaxial fluid collections are identified.  Extensive chronic small vessel  disease is demonstrated as well as mild cerebral atrophy. Somewhat ill low-attenuation areas are also noted in the right thalamus and basal ganglia, which may represent subacute or chronic lacunar infarcts. No evidence hydrocephalus. No skull abnormality identified.  IMPRESSION: Age indeterminate right thalamic and basal ganglia lacunar infarcts, which may be subacute or chronic in age. Consider brain MRI for further evaluation if clinically warranted.  Mild cerebral atrophy and extensive chronic small vessel disease.   Electronically Signed   By: Myles Rosenthal M.D.   On: 01/26/2015 20:24   Dg Chest Port 1 View  01/26/2015   CLINICAL DATA:  Shortness of breath since yesterday.  EXAM: PORTABLE CHEST - 1 VIEW  COMPARISON:  01/24/2015  FINDINGS: Small area of hazy airspace disease in the left lower lobe which may reflect atelectasis versus developing infiltrate. There is no other focal parenchymal opacity. There is no pleural effusion or pneumothorax. The heart and mediastinum are stable. There is no acute osseous abnormality.  IMPRESSION: Small areas hazy airspace ease and left lower lobe which may reflect atelectasis versus developing infiltrate.   Electronically Signed   By: Elige Ko   On: 01/26/2015 19:47   Medications: I have reviewed the patient's current medications.  Assessment/Plan:  NSTEMI: On admission, troponins elevated up to 2.8 and EKG with ST depressions in lateral leads. Patient was on heparin gtt for day and a half but then had upper GI bleeding (see below) so drip was stopped. No longer having chest pain. Echo showed EF 55-60%, no wall motion abnormalities, mild aortic regurg   BP in 140s-160s systolic. HR stable in 60s.  - Continue Coreg 6.25 mg BID, can consider titrating back up if BP/HR tolerates  - Verapamil increased to 120 mg TID  - Will consider adding low dose Hydralazine if BP remains elevated in 160s - Zofran PRN nausea - Cardiac monitoring   Upper GI Bleed: Patient  had single episode of hematemesis on 5/10. Also noted to have dark stool and FOBT positive. GI was consulted and performed EGD on 5/12 which was suspicious for possible Barrett's esophagus, biopsies taken. Also noted to have white plaques in mid-esophagus, biopsies taken. GI to do colonoscopy tomorrow given patient did not drink much of prep. Patient has not had any additional episodes of hematemesis  or dark stools. Hbg slowly trending down 10.1>9.5>9.1>8.7. - GI following, appreciate recommendations - f/u EGD biopsies - Colonoscopy possibly tomorrow  - Continue Protonix 40 mg BID  - Continue to monitor Hbg   Hypertensive Emergency-Resolved, now HTN: BPs now stable in 140s-160s systolic. Verapamil was titrated up to 120 mg TID yesterday. Will consider adding low dose Hydralazine if BPs continue to be in 160 range.  - Continue Verapamil 120 mg TID - Consider adding low dose hydralazine if BP remains elevated in 160s - Continue Coreg 6.25 mg BID, consider titrating up if HR tolerates   CKD Stage 4: Cr 4.17 on admission, now 3.80. He has underlying CKD from likely chronic uncontrolled HTN. Renal US is consistent with chronic medical disease, no obstruction present. He had a 3.3 x 3.4 x 2.5 cm cyst on the left kidney as well. He is urinating well. PTH elevated at 80 with low calcium and high phosphorus consistent with secondary hyperparathyroidism.  - Continue Renvela 800 mg TID - Will need outpatient renal follow up   Acute Pancreatitis: Abdominal pain and vomiting improved. Still some nausea. Patient is tolerating clear liquids/soft foods.  - Clears for now, NPO at midnight  - Zofran PRN nausea   Positive Hep C Antibody: HCV antibody positive. Will check RNA to determine if current or previous infection.  - f/u HCV RNA   Vitamin D Deficiency: Vit D level noted to be low at 13.0 on admission. Patient will need Vitamin D supplementation with Ergocalciferol 50,000 units Qweekly for 8 weeks and  then have levels rechecked. - Continue Ergocalciferol 50,000 units Qweekly - Will need outpatient labs in 8 weeks   Anemia of Chronic Disease: Hbg 13.6 on admission, likely hemoconcentrated. Hbg has been stable in 10s, but then had hematemesis episode as noted above. Hbg still stable at 9.1. Anemia panel shows low iron (<5), normal TIBC (290), high ferritin (381), and normal vit B12 and folate most consistent with anemia of chronic disease.  - CBC tomorrow AM   Weight Loss: Patient states he has noticed weight loss within the last month. He is unsure how long ago the weight loss started. He reports he used to weigh between 140-150 lbs. His admission weight was 109 lbs, now starting to regain some weight at 116 lbs. His Hbg has been low in 9-10 range (low prior to hematemesis episode). His Hbg on admission was 13.6 but this was likely hemoconcentrated. He is a smoker placing him at higher risk for malignancy. CXR did not show evidence of any nodules or masses. CT Abd/Pelvis remarkable for diverticulosis and prostatomegaly, no colon masses. Prostate cancer is a possibility for his weight loss. He states his mother died from colon cancer. HIV antibody is non-reactive. EGD concerning for possible Barrett's esophagus. Patient will need further work up for possible malignancy as an outpatient.  - Colonoscopy possibly tomorrow - f/u EGD biopsies   Thrombocytopenia: Platelets 148 on admission, latest 148. Likely secondary to chronic alcohol abuse.  - Continue to monitor   Substance Abuse: Patient reports drinking alcohol daily and tobacco use daily (1 ppd). He did not admit to cocaine use at first but then admitted to cocaine use 4 days prior to admission after he was told UDS results. Patient was encouraged to stop using these substances, especially the cocaine as this places him at risk for sudden cardiac death.  - Continue to encourage substance cessation  BPH: Patient reports difficulty starting a  stream. BPH noted on CT Abd/Pelvis.  -  Consider starting Flomax when tolerating PO intake better   Malnutrition: Patient with significant weight loss as noted above. Appears malnourished on exam.  - Nutrition following, appreciate recommendations - Continue Boost TID - Encouraged to ambulate halls with nurse today   Diet: Clears, NPO at midnight for possible colonoscopy   VTE PPx: SCDs Dispo: Discharge likely tomorrow   The patient does not have a current PCP (No primary care provider on file.) and [facility].  The patient does not have transportation limitations that hinder transportation to clinic appointments.  .Services Needed at time of discharge: Y = Yes, Blank = No PT:   OT:   RN:   Equipment:   Other:     LOS: 4 days   Su Hoffarly J Rivet, MD 01/28/2015, 8:03 AM

## 2015-01-28 NOTE — Progress Notes (Signed)
Daily Rounding Note  01/28/2015, 8:11 AM  LOS: 4 days   SUBJECTIVE:       Drank about 150-200 mL of the GoLYTELY prep and then quit. Wasn't nauseated. He said it caused some intestinal upset, this was not severe. Discussed importance of the prep in order to pursue colonoscopy. Patient does want to pursue colonoscopy. He now says that he'll try to drink the rest of the prep.  OBJECTIVE:         Vital signs in last 24 hours:    Temp:  [97.8 F (36.6 C)-98.8 F (37.1 C)] 98.6 F (37 C) (05/13 0809) Pulse Rate:  [57-71] 62 (05/13 0355) Resp:  [15-21] 15 (05/13 0355) BP: (142-187)/(87-98) 161/87 mmHg (05/13 0355) SpO2:  [97 %-100 %] 99 % (05/13 0355) Weight:  [116 lb (52.617 kg)] 116 lb (52.617 kg) (05/13 0355) Last BM Date: 01/27/15 Filed Weights   01/26/15 0430 01/27/15 0533 01/28/15 0355  Weight: 112 lb 10.5 oz (51.1 kg) 115 lb 3.2 oz (52.254 kg) 116 lb (52.617 kg)   General: Cachectic, alert, comfortable.   Did not reexamine patient but he is in no respiratory distress is oriented 3 and appropriate.  Intake/Output from previous day: 05/12 0701 - 05/13 0700 In: 1240 [P.O.:840; I.V.:400] Out: 1275 [Urine:1275]  Intake/Output this shift: Total I/O In: 240 [P.O.:240] Out: -   Lab Results:  Recent Labs  01/26/15 0303 01/26/15 1650 01/28/15 0258  WBC 7.8 5.3 9.8  HGB 9.5* 9.1* 8.7*  HCT 27.9* 26.7* 26.1*  PLT 133* 121* 148*   BMET  Recent Labs  01/25/15 1013 01/28/15 0258  NA  --  134*  K  --  3.0*  CL  --  103  CO2  --  22  GLUCOSE  --  81  BUN  --  41*  CREATININE  --  3.80*  CALCIUM 8.5* 8.4*   LFT No results for input(s): PROT, ALBUMIN, AST, ALT, ALKPHOS, BILITOT, BILIDIR, IBILI in the last 72 hours. PT/INR No results for input(s): LABPROT, INR in the last 72 hours. Hepatitis Panel  Recent Labs  01/25/15 1013  HCVAB Reactive*    Studies/Results: Ct Head Wo Contrast  01/26/2015    CLINICAL DATA:  Altered mental status. Blurred vision. Hypertension.  EXAM: CT HEAD WITHOUT CONTRAST  TECHNIQUE: Contiguous axial images were obtained from the base of the skull through the vertex without intravenous contrast.  COMPARISON:  None.  FINDINGS: There is no evidence of intracranial hemorrhage, brain edema, or other signs of acute cortical infarction. There is no evidence of intracranial mass lesion or mass effect. No abnormal extraaxial fluid collections are identified.  Extensive chronic small vessel disease is demonstrated as well as mild cerebral atrophy. Somewhat ill low-attenuation areas are also noted in the right thalamus and basal ganglia, which may represent subacute or chronic lacunar infarcts. No evidence hydrocephalus. No skull abnormality identified.  IMPRESSION: Age indeterminate right thalamic and basal ganglia lacunar infarcts, which may be subacute or chronic in age. Consider brain MRI for further evaluation if clinically warranted.  Mild cerebral atrophy and extensive chronic small vessel disease.   Electronically Signed   By: Myles RosenthalJohn  Stahl M.D.   On: 01/26/2015 20:24   Dg Chest Port 1 View  01/26/2015   CLINICAL DATA:  Shortness of breath since yesterday.  EXAM: PORTABLE CHEST - 1 VIEW  COMPARISON:  01/24/2015  FINDINGS: Small area of hazy airspace disease in the left lower lobe  which may reflect atelectasis versus developing infiltrate. There is no other focal parenchymal opacity. There is no pleural effusion or pneumothorax. The heart and mediastinum are stable. There is no acute osseous abnormality.  IMPRESSION: Small areas hazy airspace ease and left lower lobe which may reflect atelectasis versus developing infiltrate.   Electronically Signed   By: Elige KoHetal  Patel   On: 01/26/2015 19:47    ASSESMENT:   * Scant hematemesis, black FOBT + stool. Dysphagia. Weight loss.  01/27/15 EGD:  R/o Barrett's, non-malignant esophageal plaques.  No changes of portal htn or varices.  CT scan  shows colonic diverticulosis, no neoplasia.  Colonoscopy planned.   * Normocytic anemia. Hemoglobin down from 13.6-9.5 within the last 24 hours. FOBT +. ? anemia from chronic kidney disease.  * Hypertensive emergency on arrival to ED. Likely due to combination of cocaine and untreated hypertension.  * Thrombocytopenia.  *  Hypokalemia.  * Hep C positive.HCV Quant RNA testing in process. Liver appearance on CT scan unremarkable. No mention of portal gastropathy or varices on CT.   * Elevated lipase. CT imaging of pancreas, liver and biliary tract unremarkable. LFTs, with the exception of a mild elevation of total bilirubin, are normal.  * Elevated troponin I.  Per cardiologist, this is likely demand ischemia in setting of severe hypertension and cocaine use. No further cardiac testing planned. Cardiology signed off.  * Acute renal failure. At this point deemed to have stage IV CKD. However because he's never had medical care, baseline renal function not known. Proteinuria, microscopic hematuria. Renal ultrasound: echogenic kidneys/chronic medical renal disease, left renal cyst.  * Substance abuse with tox screen positive for cocaine. Admits to smoking crack. Also to 80 oz beer per day.   * Long-term lack of medical care.   PLAN   *  Patient says he will try to drink the prep. Not holding out a lot of hope on this but will give him a try. Will discuss timing of next colonoscopy attempt with Dr. Leone PayorGessner.  This might be as soon as tomorrow.  Keep him on clear liquids and encouraged him to consume the GoLYTELY prep.   Jennye MoccasinSarah Gribbin  01/28/2015, 8:11 AM Pager: [phone]  [facility] Attending  I have also seen and assessed the patient and agree with the advanced practitioner's assessment and plan. Try for colonoscopy again tomorrow. Discussed w/ patient. Iva Booparl E. Revin Corker, MD, Antionette FairyFACG [facility] [phone] (pager) 01/28/2015 8:00 PM

## 2015-01-28 NOTE — Progress Notes (Signed)
Pt states has drank 2 more cups and stated "Thats it, my stomach can't handle anymore."

## 2015-01-28 NOTE — Progress Notes (Signed)
RN into complete q4h assessment.  Patient appeared to be sleeping (eyes closed when RN entered room, tv off and lights out), arousable to voice, made eye contact with RN.  RN asking patient orientation questions and patient shook head no.  RN asked patient if he would drink more NuLYTELY at this time and patient continued to shake head no.

## 2015-01-28 NOTE — Progress Notes (Signed)
Patient ambulated in hall with +1 contact guard assist without difficulty. Will continue to monitor.

## 2015-01-29 ENCOUNTER — Encounter (HOSPITAL_COMMUNITY)
Admission: EM | Disposition: A | Discharge status: Home / Self Care | Payer: Self-pay | Source: Home / Self Care | Attending: Internal Medicine

## 2015-01-29 SURGERY — COLONOSCOPY
Anesthesia: Moderate Sedation

## 2015-01-29 MED ORDER — VERAPAMIL HCL 120 MG PO TABS: 120.0000 mg | ORAL_TABLET | Freq: Three times a day (TID) | ORAL | Status: DC

## 2015-01-29 MED ORDER — ASPIRIN 81 MG PO TBEC: 81.0000 mg | DELAYED_RELEASE_TABLET | Freq: Every day | ORAL | Status: DC

## 2015-01-29 MED ORDER — CARVEDILOL 6.25 MG PO TABS: 6.2500 mg | ORAL_TABLET | Freq: Two times a day (BID) | ORAL | Status: DC

## 2015-01-29 MED ORDER — PANTOPRAZOLE SODIUM 40 MG PO TBEC: 40.0000 mg | DELAYED_RELEASE_TABLET | Freq: Two times a day (BID) | ORAL | Status: AC

## 2015-01-29 MED ORDER — SEVELAMER CARBONATE 800 MG PO TABS: 800.0000 mg | ORAL_TABLET | Freq: Three times a day (TID) | ORAL | Status: AC

## 2015-01-29 MED ORDER — VITAMIN D (ERGOCALCIFEROL) 1.25 MG (50000 UNIT) PO CAPS: 50000.0000 [IU] | ORAL_CAPSULE | ORAL | Status: DC

## 2015-01-29 NOTE — Progress Notes (Signed)
Physician aware of pt refusal of bowel prep for am procedure.

## 2015-01-29 NOTE — Progress Notes (Signed)
Subjective: Patient refusing to drink all of bowel prep again. He states he felt bloated after drinking 4 cups which prompted him to stop. He states he walked the halls with the nurse yesterday and did well. He denies any chest pain, dyspnea, abdominal pain, nausea, vomiting, or dark stools.   Objective: Vital signs in last 24 hours: Filed Vitals:   01/28/15 2057 01/28/15 2139 01/28/15 2355 01/29/15 0640  BP: 172/108 154/97 116/80 137/86  Pulse: 64  60 65  Temp: 98.6 F (37 C)  98 F (36.7 C) 98.7 F (37.1 C)  TempSrc: Oral  Oral Oral  Resp:      Height:      Weight:    115 lb 11.2 oz (52.481 kg)  SpO2: 100%  97% 99%   Weight change: -4.8 oz (-0.136 kg)  Intake/Output Summary (Last 24 hours) at 01/29/15 0951 Last data filed at 01/29/15 [id number]  Gross per 24 hour  Intake    963 ml  Output    817 ml  Net    146 ml   Physical Exam General: thin appearing man resting in bed, pleasant, NAD HEENT: [place]/AT, EOMI. Left ear has a large tear at the bottom, tip of ear almost completely severed from rest of ear. No active bleeding or tenderness. Mucus membranes moist CV: RRR, no m/g/r Pulm: CTA bilaterally, breaths non-labored Abd: BS+, soft, non-tender, non-distended Ext: warm, no edema Neuro: alert and oriented x 3, no focal deficits   Lab Results: Basic Metabolic Panel:  Recent Labs Lab 01/24/15 0812  01/25/15 0346 01/25/15 1013 01/28/15 0258  NA  --   < > 136  --  134*  K  --   < > 4.3  --  3.0*  CL  --   < > 108  --  103  CO2  --   < > 21*  --  22  GLUCOSE  --   < > 102*  --  81  BUN  --   < > 40*  --  41*  CREATININE  --   < > 3.72*  --  3.80*  CALCIUM  --   < > 8.9 8.5* 8.4*  MG 2.1  --   --   --   --   PHOS 4.0  --  5.0*  --   --   < > = values in this interval not displayed. Liver Function Tests:  Recent Labs Lab 01/24/15 0433 01/25/15 0346  AST 29  --   ALT 22  --   ALKPHOS 56  --   BILITOT 1.3*  --   PROT 7.4  --   ALBUMIN 3.8 3.1*    Recent  Labs Lab 01/24/15 0812  LIPASE 173*   CBC:  Recent Labs Lab 01/26/15 1650 01/28/15 0258  WBC 5.3 9.8  HGB 9.1* 8.7*  HCT 26.7* 26.1*  MCV 85.6 85.3  PLT 121* 148*   Cardiac Enzymes:  Recent Labs Lab 01/24/15 1508 01/24/15 1930 01/25/15 0346  TROPONINI 1.35* 1.98* 2.87*   CBG:  Recent Labs Lab 01/24/15 0426  GLUCAP 98   Fasting Lipid Panel:  Recent Labs Lab 01/25/15 0346  CHOL 148  HDL 62  LDLCALC 76  TRIG 49  CHOLHDL 2.4   Thyroid Function Tests:  Recent Labs Lab 01/25/15 0346  TSH 1.395   Coagulation:  Recent Labs Lab 01/24/15 0812  LABPROT 13.1  INR 0.98   Anemia Panel:  Recent Labs Lab 01/27/15 0410  VITAMINB12 368  FOLATE 17.7  FERRITIN 381*  TIBC 290  IRON <5*   Urine Drug Screen: Drugs of Abuse     Component Value Date/Time   LABOPIA NONE DETECTED 01/24/2015 0528   COCAINSCRNUR POSITIVE* 01/24/2015 0528   LABBENZ NONE DETECTED 01/24/2015 0528   AMPHETMU NONE DETECTED 01/24/2015 0528   THCU NONE DETECTED 01/24/2015 0528   LABBARB NONE DETECTED 01/24/2015 0528    Alcohol Level:  Recent Labs Lab 01/24/15 0812  ETH <5   Urinalysis:  Recent Labs Lab 01/24/15 0758  COLORURINE YELLOW  LABSPEC 1.010  PHURINE 5.5  GLUCOSEU NEGATIVE  HGBUR SMALL*  BILIRUBINUR NEGATIVE  KETONESUR NEGATIVE  PROTEINUR >300*  UROBILINOGEN 1.0  NITRITE NEGATIVE  LEUKOCYTESUR TRACE*   Medications: I have reviewed the patient's current medications.  Assessment/Plan:  NSTEMI: On admission, troponins elevated up to 2.8 and EKG with ST depressions in lateral leads. Patient was on heparin gtt for day and a half but then had upper GI bleeding (see below) so drip was stopped. No longer having chest pain. Echo showed EF 55-60%, no wall motion abnormalities, mild aortic regurg - BP improved, in 110s-130s systolic  - Continue Coreg 6.25 mg BID, can titrate up as tolerated as an outpatient  - Continue Verapamil 120 mg TID  - Zofran PRN  nausea  Upper GI Bleed: Patient had single episode of hematemesis on 5/10. Also noted to have dark stool and FOBT positive. 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 Patient will require surveillance every 3-5 years. Biopsies of the white plaques were negative for eosinophils, dysplasia, and malignancy. Patient failed to drink prep twice now for colonoscopy. Will have to try again as outpatient. Patient has not had any additional episodes of hematemesis or dark stools. Hbg stabilizing at 8.7-9 range.  - GI following, appreciate recommendations - Colonoscopy as outpatient - Continue Protonix 40 mg BID  - Will need CBC at outpatient visit   Hypertensive Emergency-Resolved, now HTN: BPs now stable in 110s-140s systolic.  - Continue Verapamil 120 mg TID - Continue Coreg 6.25 mg BID, can titrate up as outpatient   CKD Stage 4: Cr 4.17 on admission, now 3.80. He has underlying CKD from likely chronic uncontrolled HTN. Renal US is consistent with chronic medical disease, no obstruction present. He had a 3.3 x 3.4 x 2.5 cm cyst on the left kidney as well. He is urinating well. PTH elevated at 80 with low calcium and high phosphorus consistent with secondary hyperparathyroidism.  - Continue Renvela 800 mg TID - Will need outpatient renal follow up>> IM clinic sending paperwork to [facility]  Acute Pancreatitis-Resolved: Abdominal pain, nausea, and vomiting improved. Patient is tolerating clear liquids/soft foods.  - Advance to heart healthy diet  - Zofran PRN nausea   Positive Hep C Antibody: HCV antibody positive. Will check RNA to determine if current or previous infection.  - f/u HCV RNA   Vitamin D Deficiency: Vit D level noted to be low at 13.0 on admission. Patient will need Vitamin D supplementation with Ergocalciferol 50,000 units Qweekly for 8 weeks and then have levels rechecked. -  Continue Ergocalciferol 50,000 units Qweekly - Will need outpatient labs in 8 weeks   Anemia of Chronic Disease: Hbg 13.6 on admission, likely hemoconcentrated. Hbg stable at 8.7. Anemia panel shows low iron (<5), normal TIBC (290), high ferritin (381), and normal vit B12 and folate most consistent with anemia of chronic disease.  -  Will need CBC at outpatient visit   Weight Loss: Patient states he has noticed weight loss within the last month. He is unsure how long ago the weight loss started. He reports he used to weigh between 140-150 lbs. His admission weight was 109 lbs, now starting to regain some weight at 115 lbs. His Hbg has been low in 9-10 range (low prior to hematemesis episode). His Hbg on admission was 13.6 but this was likely hemoconcentrated. He is a smoker placing him at higher risk for malignancy. CXR did not show evidence of any nodules or masses. CT Abd/Pelvis remarkable for diverticulosis and prostatomegaly, no colon masses. Prostate cancer is a possibility for his weight loss. He states his mother died from colon cancer. HIV antibody is non-reactive. EGD concerning for Barrett's esophagus but no dysplasia on biopsy. Patient will need further work up for possible malignancy as an outpatient.  - Colonoscopy as outpatient since unable to drink prep at this time - Will need surveillance for Barrett's esophagus every 3-5 years   Thrombocytopenia: Platelets 148 on admission, latest 148. Likely secondary to chronic alcohol abuse.  - Continue to monitor   Substance Abuse: Patient reports drinking alcohol daily and tobacco use daily (1 ppd). He did not admit to cocaine use at first but then admitted to cocaine use 4 days prior to admission after he was told UDS results. Patient was encouraged to stop using these substances, especially the cocaine as this places him at risk for sudden cardiac death.  - Continue to encourage substance cessation  BPH: Patient reports difficulty starting  a stream. BPH noted on CT Abd/Pelvis.  - Will consider Flomax as outpatient - I am concerned about starting too many medications at once since this patient has not been in the medical system for 40 years and has never been on medications. BP meds the most important at this point.   Malnutrition: Patient with significant weight loss as noted above. Appears malnourished on exam. Ambulated hall with nurse yesterday and did well.  - Nutrition following, appreciate recommendations - Continue Boost TID  Diet: Heart healthy VTE PPx: SCDs Dispo: Discharge today. Patient has follow [facility] the [facility] on May 20th.   The patient does not have a current PCP (No primary care provider on file.) and does need an [facility] hospital follow-up appointment after discharge.  The patient does not have transportation limitations that hinder transportation to clinic appointments.  .Services Needed at time of discharge: Y = Yes, Blank = No PT:   OT:   RN:   Equipment:   Other:     LOS: 5 days   Su Hoff, MD 01/29/2015, 9:51 AM

## 2015-01-29 NOTE — Progress Notes (Signed)
Pt was awaken for 12 o'clock vital. Encouraged to drink a cup of GoLytely which he quickly refused.Explained they wouldn't be able to do the colonscopy in am if bowels are not clean.

## 2015-01-29 NOTE — Discharge Summary (Signed)
Name: Lee Swanson MRN: [id number] DOB: 1950/10/28 64 y.o. PCP: No primary care provider on file.  Date of Admission: 01/24/2015  4:04 AM Date of Discharge: 01/29/2015 Attending Physician: Burns SpainElizabeth A Butcher, MD  Discharge Diagnosis: Principal Problems Hypertensive Emergency NSTEMI Active Problems Upper GI Bleed CKD Stage 4 Acute Pancreatitis Positive Hepatitis C Antibody Vitamin D Deficiency Anemia of Chronic Disease Unintentional Weight Loss Thrombocytopenia Substance Abuse BPH Malnutrition   Discharge Medications:   Medication List    TAKE these medications        aspirin 81 MG EC tablet  Take 1 tablet (81 mg total) by mouth daily.     carvedilol 6.25 MG tablet  Commonly known as:  COREG  Take 1 tablet (6.25 mg total) by mouth 2 (two) times daily with a meal.     pantoprazole 40 MG tablet  Commonly known as:  PROTONIX  Take 1 tablet (40 mg total) by mouth 2 (two) times daily.     sevelamer carbonate 800 MG tablet  Commonly known as:  RENVELA  Take 1 tablet (800 mg total) by mouth 3 (three) times daily with meals.     verapamil 120 MG tablet  Commonly known as:  CALAN  Take 1 tablet (120 mg total) by mouth every 8 (eight) hours.     Vitamin D (Ergocalciferol) 50000 UNITS Caps capsule  Commonly known as:  DRISDOL  Take 1 capsule (50,000 Units total) by mouth every 7 (seven) days. TAKE ONE PILL EVERY WEDNESDAY        Disposition and follow-up:   Lee Swanson [facility] Good [facility].  At the hospital follow up visit please address:  1.  HTN- Did pt pick up Verapamil and Coreg? Recheck BP and consider uptitrating Coreg if BP and HR tolerate.  NSTEMI- Any additional chest pain?  UGIB/Anemia- will need repeat CBC. Any additional bleeding since discharge? Did he refill Protonix? Will need colonoscopy as outpatient.  CKD Stage 4- Paperwork for referral to [facility] already in process (check with Lela). Also  needs bmet to assess Cr. Positive HepC Ab- f/u HCV RNA quant BPH- Can discuss starting Flomax. Was not started as pt previously not on any meds and has financial difficulties. Most important for him to get BP meds.  Medications- Have pt see Deb Hill. Instructed him that the 2 medications he had to pick up were Coreg and Verapamil. See if he was able to afford any of the other prescribed meds.   2.  Labs / imaging needed at time of follow-up: CBC, bmet   3.  Pending labs/ test needing follow-up: HCV RNA quant  Follow-up Appointments:     Follow-up Information    Follow up with Gara Kronerruong, Diana, MD.   Specialty:  Internal Medicine   Why:  Appointment on May 20th at 1:15 PM    Contact information:   [address] [place] [address] [phone]       Discharge Instructions:   Consultations: Treatment Team:  Iva Booparl E Gessner, MD  Procedures Performed:  Ct Abdomen Pelvis Wo Contrast  01/24/2015   CLINICAL DATA:  Hypertension and shortness of breath. Frequent urination.  EXAM: CT ABDOMEN AND PELVIS WITHOUT CONTRAST  TECHNIQUE: Multidetector CT imaging of the abdomen and pelvis was performed following the standard protocol without IV contrast.  COMPARISON:  None.  FINDINGS: BODY WALL: No contributory findings.  LOWER CHEST: Proximal left circulation coronary artery atherosclerosis.  ABDOMEN/PELVIS:  Liver: No  focal abnormality.  Biliary: No evidence of biliary obstruction or stone.  Pancreas: Unremarkable.  Spleen: Unremarkable.  Adrenals: Unremarkable.  Kidneys and ureters: No hydronephrosis or stone. Fetal appearing lobulation of the bilateral kidneys. An exophytic 3 cm presumed cyst noted from the upper pole left kidney.  Bladder: Mild circumferential wall thickening, accentuated by underdistention.  Reproductive: Marked symmetric enlargement of the prostate gland, 6.5 cm in transverse dimension  Bowel: No obstruction. Negative appendix. Mild colonic diverticulosis.  Retroperitoneum: No mass  or adenopathy.  Peritoneum: No ascites or pneumoperitoneum.  Vascular: No acute abnormality.  OSSEOUS: No acute abnormalities. L4-5 and L5-S1 degenerative disc narrowing and spurring.  IMPRESSION: 1. No acute intra-abdominal findings. 2. Marked prostatomegaly. 3. Colonic diverticulosis. 4. Coronary atherosclerosis.   Electronically Signed   By: Marnee Spring M.D.   On: 01/24/2015 08:29   Dg Chest 2 View  01/24/2015   CLINICAL DATA:  Shortness of Breath  EXAM: CHEST  2 VIEW  COMPARISON:  None.  FINDINGS: There is no edema or consolidation. The heart size and pulmonary vascularity are normal. No adenopathy. There is evidence of old rib trauma involving the posterior left eighth rib.  IMPRESSION: No edema or consolidation.   Electronically Signed   By: Bretta Bang III M.D.   On: 01/24/2015 07:16   Ct Head Wo Contrast  01/26/2015   CLINICAL DATA:  Altered mental status. Blurred vision. Hypertension.  EXAM: CT HEAD WITHOUT CONTRAST  TECHNIQUE: Contiguous axial images were obtained from the base of the skull through the vertex without intravenous contrast.  COMPARISON:  None.  FINDINGS: There is no evidence of intracranial hemorrhage, brain edema, or other signs of acute cortical infarction. There is no evidence of intracranial mass lesion or mass effect. No abnormal extraaxial fluid collections are identified.  Extensive chronic small vessel disease is demonstrated as well as mild cerebral atrophy. Somewhat ill low-attenuation areas are also noted in the right thalamus and basal ganglia, which may represent subacute or chronic lacunar infarcts. No evidence hydrocephalus. No skull abnormality identified.  IMPRESSION: Age indeterminate right thalamic and basal ganglia lacunar infarcts, which may be subacute or chronic in age. Consider brain MRI for further evaluation if clinically warranted.  Mild cerebral atrophy and extensive chronic small vessel disease.   Electronically Signed   By: Myles Rosenthal M.D.   On:  01/26/2015 20:24   US Renal  01/24/2015   CLINICAL DATA:  Acute renal failure  EXAM: RENAL / URINARY TRACT ULTRASOUND COMPLETE  COMPARISON:  None.  FINDINGS: Right Kidney:  Length: 9.4 cm. Appears echogenic with a lobular contour. No mass or hydronephrosis noted.  Left Kidney:  Length: 9.7 cm. Increased parenchymal echogenicity. Cyst within the upper pole measures 3.3 x 3.4 x 3.5 cm.  Bladder:  Appears normal for degree of bladder distention.  IMPRESSION: 1. Bilateral echogenic kidneys compatible with chronic medical renal disease. 2. Left renal cyst.   Electronically Signed   By: Signa Kell M.D.   On: 01/24/2015 20:57   Dg Chest Port 1 View  01/26/2015   CLINICAL DATA:  Shortness of breath since yesterday.  EXAM: PORTABLE CHEST - 1 VIEW  COMPARISON:  01/24/2015  FINDINGS: Small area of hazy airspace disease in the left lower lobe which may reflect atelectasis versus developing infiltrate. There is no other focal parenchymal opacity. There is no pleural effusion or pneumothorax. The heart and mediastinum are stable. There is no acute osseous abnormality.  IMPRESSION: Small areas hazy airspace ease and left lower  lobe which may reflect atelectasis versus developing infiltrate.   Electronically Signed   By: Elige KoHetal  Patel   On: 01/26/2015 19:47    2D Echo:  01/26/15 Study Conclusions - Left ventricle: The cavity size was normal. Wall thickness was normal. Systolic function was normal. The estimated ejection fraction was in the range of 55% to 60%. Wall motion was normal; there were no regional wall motion abnormalities. - Aortic valve: There was mild regurgitation.  Admission HPI: 64 y.o no pmh presents with multiple symptoms. He c/o left chest pain since 5 am ? if at rest/exertion per pt. CP feels like tightening 10/10 was on the right now radiated to left side "around his heart". Pain is 10/10 associated with nausea, vomiting, sob, denies sweating. He is unsure of what makes it worse ? If  eating makes it worse, nothing makes CP betters. Overall CP has occurred x 1-2 months though worsened since this am 5 am. He also states he blacked out for a short period this am while trying to get up to use the bathroom. He denies hitting his head and this was not witnessed. He has also had intermittent sob at rest. He also reports RUQ, epigastric 10/10 abdominal pain "tightening" sensation nothing makes worse and he has not tried any medication. Pain worsening today but has been ongoing x 1-2 months. Associated with nausea, vomiting, decreased po intake x 4 days. He also reports 10/10 h/a that was bitemporal now frontal. H/a is throbbing and assoc. With blurry vision in left eye.   He has not seen a doctor in 40 years. BP in the ED was 233/145. He was given Hydralazine 30 mg total and started on NTG gtt in ED around 8:06 at 10 mcg.   Hospital Course by problem list:   Hypertensive Emergency: Patient presented with BP 233/145 with chest pain and found to have AKI on CKD. Patient had not seen a physician in over 40 years and likely has chronic uncontrolled HTN. Although no cardiomegaly appreciated on CXR. He was started on a nitro gtt and BP improved to the 190s-200s systolic. He was started on oral antihypertensives as he was gradually weaned from the nitro gtt. By time of discharge his BP was controlled in the 110s-140s systolic. He was discharged on Verapamil 120 mg TID and Coreg 6.25 mg BID. [facility] for which he will need a BP recheck and increase in his Coreg if his BP and HR tolerate it.   NSTEMI: On admission, troponins elevated up to 2.8 and EKG with ST depressions in lateral leads. Patient was on heparin gtt for day and a half but then had upper GI bleeding (see below) so drip was stopped. He was also started on Coreg at that time. He had an echo which showed EF 55-60%, no wall motion abnormalities, and mild aortic regurgitation. He did not have any  additional chest pain once his BP was under control. He was discharged on Coreg 6.25 mg BID and Verapamil 120 mg TID. Coreg can be titrated up at his outpatient visit as tolerated by his BP and HR. I stressed to the patient that he had to pick up these two medications. He will need a BP recheck and assessment for any additional cardiac symptoms at his outpatient follow up.    Upper GI Bleed: Patient had single episode of hematemesis on 5/10. Also noted to have dark stool and FOBT positive. 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 Patient will require surveillance every 3-5 years. EGD also showed white plaques in the mid-esophagus which were negative for eosinophils, dysplasia, and malignancy. Patient failed to drink prep for colonoscopy twice while in the hospital. He will need to have an outpatient colonoscopy scheduled. His Hbg was stable at 8.7 by time of discharge. He was discharged on Protonix 40 mg BID. He will need a repeat CBC at his outpatient visit.   CKD Stage 4: Cr 4.17 on admission, improved to 3.80 by time of discharge (likely baseline). He has underlying CKD from likely chronic uncontrolled HTN. Renal US was consistent with chronic medical disease, no obstruction present. He had a 3.3 x 3.4 x 2.5 cm cyst on the left kidney as well. He was urinating well. PTH elevated at 80 with low calcium and high phosphorus consistent with secondary hyperparathyroidism.He was started on Renvela 800 mg TID but likely will not be able to afford as an outpatient. He will need to be seen at [facility] and the paperwork for referral has already been started. He will need a repeat bmet at his next outpatient visit.   Acute Pancreatitis: Patient presented with 10/10 abdominal pain, nausea, vomiting, and unable to keep anything down with elevated lipase of 173. Patient noted similar abdominal pain a few months  ago. CT Abd did not show evidence of pancreatitis but his clinical picture was consistent with pancreatitis. He improved after being placed on IVF. He was able to advance his diet gradually and by time of discharge was tolerating a regular diet.   Positive Hepatitis C Antibody: Hep C antibody positive. HCV RNA quant was ordered but result still pending upon discharge. This will need to be followed up at his outpatient appointment.   Vitamin D Deficiency: Vit D level noted to be low at 13.0 on admission. Patient will need Vitamin D supplementation with Ergocalciferol 50,000 units Qweekly for 8 weeks and then have levels rechecked. He was discharged on Ergocalciferol and will need repeat levels in 7 weeks (end of June).   Anemia of Chronic Disease: Hbg 13.6 on admission (likely hemoconcentrated) and remained stable in 8-9 range. Hbg was 8.7 on discharge. Anemia panel showed low iron (<5), normal TIBC (290), high ferritin (381), and normal vit B12 and folate most consistent with anemia of chronic disease. He will need a repeat CBC at his outpatient visit.   Unintentional Weight Loss: On admission, patient stated he had noticed weight loss within the last month. He was unsure how long ago the weight loss started. He reported he used to weigh between 140-150 lbs. His admission weight was 109 lbs, and he regained a few pounds while in the hospital (115 lbs at discharge). He is a smoker placing him at higher risk for malignancy. CXR did not show evidence of any nodules or masses. CT Abd/Pelvis remarkable for diverticulosis and prostatomegaly, no colon masses. Prostate cancer is a possibility for his weight loss. He states his mother died from colon cancer. HIV antibody is non-reactive. EGD concerning for Barrett's esophagus but no dysplasia on biopsy. Patient will need further work up for possible malignancy as an outpatient including colonoscopy as this could not be completed while he was an inpatient. He will  also need continued surveillance for Barrett's esophagus every 3-5 years.   Thrombocytopenia: Platelets 148 on admission, kept stable in 120-140 range. His platelets on discharge were 148. Likely secondary  to chronic alcohol abuse.   Substance Abuse: Patient reported daily alcohol use and tobacco use (1 ppd). He later admitted to cocaine use 4 days prior to admission after notified of UDS results. Patient had stated he will never do cocaine again as he was told of the high risks of sudden cardiac death. Continue to encourage substance cessation.   BPH: Patient had reported difficulty starting a stream on admission. BPH was noted on CT Abd/Pelvis. He was not started on Flomax as he has never been on medications before and has financial difficulties already. Can consider starting Flomax as outpatient as long as he is able to get BP medications.   Malnutrition: Patient with significant weight loss as noted above. Appeared malnourished on exam. He was started on Boost TID as recommended by nutrition. He was able to ambulate the halls with the nurse well by time of discharge.   Discharge Vitals:   BP 137/86 mmHg  Pulse 65  Temp(Src) 98.7 F (37.1 C) (Oral)  Resp 15  Ht  (1.676 m)  Wt 115 lb 11.2 oz (52.481 kg)  BMI 18.68 kg/m2  SpO2 99% Physical Exam General: thin appearing man resting in bed, pleasant, NAD HEENT: [place]/AT, EOMI. Left ear has a large tear at the bottom, tip of ear almost completely severed from rest of ear. No active bleeding or tenderness. Mucus membranes moist CV: RRR, no m/g/r Pulm: CTA bilaterally, breaths non-labored Abd: BS+, soft, non-tender, non-distended Ext: warm, no edema Neuro: alert and oriented x 3, no focal deficits   Signed: Su Hoff, MD 01/29/2015, 10:50 AM    Services Ordered on Discharge: None  Equipment Ordered on Discharge: None

## 2015-01-29 NOTE — Discharge Instructions (Signed)
It was a pleasure taking care of you, Lee Swanson.  - Please pick up your blood pressure medications, Coreg and Verapamil - If you can pick up the other medicines that were prescribed that would be great but the Coreg and Verapamil medicines are the most important - Please make it to your appointment in the Internal Medicine Center ([facility] on May 20th at 1:15 PM - If you have any additional dark stool or bloody stool or vomiting up blood go to the emergency room   Take care, Dr. Beckie Saltsivet

## 2015-01-30 SURGERY — COLONOSCOPY
Anesthesia: Moderate Sedation

## 2015-02-01 LAB — HCV RNA QUANT
HCV QUANT: 9598091 [IU]/mL — AB (ref <15)
HCV Quantitative Log: 6.98 {Log} — ABNORMAL HIGH (ref <1.18)

## 2015-02-04 ENCOUNTER — Ambulatory Visit: Payer: Self-pay | Admitting: Internal Medicine

## 2015-02-05 ENCOUNTER — Encounter: Payer: Self-pay | Admitting: Internal Medicine

## 2015-02-05 DIAGNOSIS — K2270 Barrett's esophagus without dysplasia: Secondary | ICD-10-CM

## 2015-02-05 HISTORY — DX: Barrett's esophagus without dysplasia: K22.70

## 2015-02-05 NOTE — Progress Notes (Signed)
Quick Note:  Letter created + Barrett's Repeat EGD 2019 ______

## 2015-02-08 ENCOUNTER — Emergency Department (HOSPITAL_COMMUNITY)
Admission: EM | Admit: 2015-02-08 | Discharge: 2015-02-09 | Disposition: A | Discharge status: Home / Self Care | Payer: Medicaid Other | Attending: Emergency Medicine | Admitting: Emergency Medicine

## 2015-02-08 ENCOUNTER — Emergency Department (HOSPITAL_COMMUNITY): Payer: Medicaid Other

## 2015-02-08 ENCOUNTER — Encounter (HOSPITAL_COMMUNITY): Payer: Self-pay | Admitting: *Deleted

## 2015-02-08 DIAGNOSIS — Z720 Tobacco use: Secondary | ICD-10-CM | POA: Diagnosis not present

## 2015-02-08 DIAGNOSIS — R200 Anesthesia of skin: Secondary | ICD-10-CM | POA: Insufficient documentation

## 2015-02-08 DIAGNOSIS — E559 Vitamin D deficiency, unspecified: Secondary | ICD-10-CM | POA: Insufficient documentation

## 2015-02-08 DIAGNOSIS — H538 Other visual disturbances: Secondary | ICD-10-CM | POA: Insufficient documentation

## 2015-02-08 DIAGNOSIS — Z7982 Long term (current) use of aspirin: Secondary | ICD-10-CM | POA: Insufficient documentation

## 2015-02-08 DIAGNOSIS — Z79899 Other long term (current) drug therapy: Secondary | ICD-10-CM | POA: Insufficient documentation

## 2015-02-08 DIAGNOSIS — I129 Hypertensive chronic kidney disease with stage 1 through stage 4 chronic kidney disease, or unspecified chronic kidney disease: Secondary | ICD-10-CM | POA: Diagnosis present

## 2015-02-08 DIAGNOSIS — Z8619 Personal history of other infectious and parasitic diseases: Secondary | ICD-10-CM | POA: Insufficient documentation

## 2015-02-08 DIAGNOSIS — N184 Chronic kidney disease, stage 4 (severe): Secondary | ICD-10-CM | POA: Diagnosis not present

## 2015-02-08 DIAGNOSIS — Z862 Personal history of diseases of the blood and blood-forming organs and certain disorders involving the immune mechanism: Secondary | ICD-10-CM | POA: Diagnosis not present

## 2015-02-08 DIAGNOSIS — R112 Nausea with vomiting, unspecified: Secondary | ICD-10-CM | POA: Insufficient documentation

## 2015-02-08 DIAGNOSIS — G529 Cranial nerve disorder, unspecified: Secondary | ICD-10-CM | POA: Diagnosis not present

## 2015-02-08 DIAGNOSIS — Z8719 Personal history of other diseases of the digestive system: Secondary | ICD-10-CM | POA: Insufficient documentation

## 2015-02-08 DIAGNOSIS — F329 Major depressive disorder, single episode, unspecified: Secondary | ICD-10-CM | POA: Insufficient documentation

## 2015-02-08 DIAGNOSIS — I252 Old myocardial infarction: Secondary | ICD-10-CM | POA: Diagnosis not present

## 2015-02-08 DIAGNOSIS — R45851 Suicidal ideations: Secondary | ICD-10-CM

## 2015-02-08 LAB — URINE MICROSCOPIC-ADD ON

## 2015-02-08 LAB — CBC WITH DIFFERENTIAL/PLATELET
Basophils Absolute: 0 10*3/uL (ref 0.0–0.1)
Basophils Relative: 0 % (ref 0–1)
Eosinophils Absolute: 0 10*3/uL (ref 0.0–0.7)
Eosinophils Relative: 0 % (ref 0–5)
HCT: 28.7 % — ABNORMAL LOW (ref 39.0–52.0)
Hemoglobin: 9.3 g/dL — ABNORMAL LOW (ref 13.0–17.0)
LYMPHS PCT: 9 % — AB (ref 12–46)
Lymphs Abs: 0.7 10*3/uL (ref 0.7–4.0)
MCH: 29.2 pg (ref 26.0–34.0)
MCHC: 32.4 g/dL (ref 30.0–36.0)
MCV: 90.3 fL (ref 78.0–100.0)
MONO ABS: 0.6 10*3/uL (ref 0.1–1.0)
Monocytes Relative: 8 % (ref 3–12)
NEUTROS PCT: 83 % — AB (ref 43–77)
Neutro Abs: 6.5 10*3/uL (ref 1.7–7.7)
PLATELETS: 215 10*3/uL (ref 150–400)
RBC: 3.18 MIL/uL — ABNORMAL LOW (ref 4.22–5.81)
RDW: 13.8 % (ref 11.5–15.5)
WBC: 7.9 10*3/uL (ref 4.0–10.5)

## 2015-02-08 LAB — COMPREHENSIVE METABOLIC PANEL
ALK PHOS: 85 U/L (ref 38–126)
ALT: 33 U/L (ref 17–63)
AST: 39 U/L (ref 15–41)
Albumin: 3.5 g/dL (ref 3.5–5.0)
Anion gap: 12 (ref 5–15)
BUN: 25 mg/dL — ABNORMAL HIGH (ref 6–20)
CO2: 26 mmol/L (ref 22–32)
Calcium: 8.8 mg/dL — ABNORMAL LOW (ref 8.9–10.3)
Chloride: 97 mmol/L — ABNORMAL LOW (ref 101–111)
Creatinine, Ser: 3.24 mg/dL — ABNORMAL HIGH (ref 0.61–1.24)
GFR, EST AFRICAN AMERICAN: 22 mL/min — AB (ref >60)
GFR, EST NON AFRICAN AMERICAN: 19 mL/min — AB (ref >60)
Glucose, Bld: 90 mg/dL (ref 65–99)
Potassium: 4 mmol/L (ref 3.5–5.1)
SODIUM: 135 mmol/L (ref 135–145)
Total Bilirubin: 0.7 mg/dL (ref 0.3–1.2)
Total Protein: 7 g/dL (ref 6.5–8.1)

## 2015-02-08 LAB — I-STAT TROPONIN, ED: Troponin i, poc: 0.04 ng/mL (ref 0.00–0.08)

## 2015-02-08 LAB — URINALYSIS, ROUTINE W REFLEX MICROSCOPIC
Bilirubin Urine: NEGATIVE
Glucose, UA: NEGATIVE mg/dL
Ketones, ur: NEGATIVE mg/dL
Leukocytes, UA: NEGATIVE
Nitrite: NEGATIVE
Protein, ur: 100 mg/dL — AB
Specific Gravity, Urine: 1.008 (ref 1.005–1.030)
Urobilinogen, UA: 0.2 mg/dL (ref 0.0–1.0)
pH: 7.5 (ref 5.0–8.0)

## 2015-02-08 LAB — RAPID URINE DRUG SCREEN, HOSP PERFORMED
Amphetamines: NOT DETECTED
BENZODIAZEPINES: NOT DETECTED
Barbiturates: NOT DETECTED
Cocaine: NOT DETECTED
Opiates: NOT DETECTED
Tetrahydrocannabinol: NOT DETECTED

## 2015-02-08 LAB — ETHANOL: Alcohol, Ethyl (B): <5 mg/dL

## 2015-02-08 LAB — SALICYLATE LEVEL

## 2015-02-08 LAB — ACETAMINOPHEN LEVEL: Acetaminophen (Tylenol), Serum: <10 ug/mL — ABNORMAL LOW (ref 10–30)

## 2015-02-08 MED ORDER — LORAZEPAM 1 MG PO TABS: 1.0000 mg | ORAL_TABLET | Freq: Three times a day (TID) | ORAL | Status: DC | PRN

## 2015-02-08 MED ORDER — VERAPAMIL HCL 120 MG PO TABS
120.0000 mg | ORAL_TABLET | Freq: Three times a day (TID) | ORAL | Status: DC
  Administered 2015-02-08 – 2015-02-09 (×5): 120 mg via ORAL
  Filled 2015-02-08 (×8): qty 1

## 2015-02-08 MED ORDER — CARVEDILOL 6.25 MG PO TABS
6.2500 mg | ORAL_TABLET | Freq: Two times a day (BID) | ORAL | Status: DC
  Administered 2015-02-08 – 2015-02-09 (×3): 6.25 mg via ORAL
  Filled 2015-02-08 (×6): qty 1

## 2015-02-08 MED ORDER — IBUPROFEN 200 MG PO TABS
600.0000 mg | ORAL_TABLET | Freq: Three times a day (TID) | ORAL | Status: DC | PRN
  Administered 2015-02-08: 600 mg via ORAL
  Filled 2015-02-08 (×2): qty 1

## 2015-02-08 MED ORDER — ACETAMINOPHEN 325 MG PO TABS
650.0000 mg | ORAL_TABLET | ORAL | Status: DC | PRN
  Administered 2015-02-08 – 2015-02-09 (×2): 650 mg via ORAL
  Filled 2015-02-08 (×2): qty 2

## 2015-02-08 NOTE — ED Notes (Signed)
Pt able to cough, lick top and bottom lip and CN 12 is intact. Pt sts was placed on dysphagia diet in past and sts has trouble with eating due to taste buds not working. Pt denies problems swallowing and lung sounds clear throughout before and after drinking water. Dr. Julieanne Mansonegler made aware of patients swallow screen and abilities and sts pt able to tolerate PO meds. Pt taken PO meds without difficulty and lung sounds clear throughout.

## 2015-02-08 NOTE — ED Provider Notes (Signed)
CSN: [id number]     Arrival date & time 02/08/15  0759 History   First MD Initiated Contact with Patient 02/08/15 [id number]     Chief Complaint  Patient presents with  . Hypertension    Lee Swanson is a 64 y.o. male with a past medical history significant for hepatitis C, chronic kidney disease, hypertension, NSTEMI, barrett's esophagus, and recent admission for hypertensive emergency who presents with headache and vision changes. The patient reports that he was discharged approximately 10 days prior to arrival and, due to social constraints, has not been able to fill any of his discharge medications. The patient says that since discharge, he has continued to have headache and left eye blurry vision. The patient says that he is also had intermittent nausea and vomiting and had one episode of blood tinge in his emesis at 4 AM this morning. The patient says that for his prior admission, he had chest pain, shortness of breath and abdominal pain however, he reports that those symptoms have resolved since discharge. The patient's primary complaint on arrival is his 10 out of 10 headache. The patient says the pain is in his left face and behind his left eye. The patient denies any fevers, chills, chest pain, shortness of breath, gas patient, diarrhea, dysuria, rashes. The patient reports decreased sleep and decreased by mouth intake. The patient says that "all other doing is lying in bed with my headache". The patient's blood pressure on arrival was 232/140.    (Consider location/radiation/quality/duration/timing/severity/associated sxs/prior Treatment) Patient is a 64 y.o. male presenting with headaches. The history is provided by the patient and medical records. No language interpreter was used.  Headache Pain location:  L temporal Quality:  Stabbing and sharp Radiates to:  Does not radiate Severity currently:  10/10 Severity at highest:  10/10 Onset quality:  Gradual Duration:  10 days Timing:   Constant Progression:  Unchanged Chronicity:  Recurrent Similar to prior headaches: yes   Relieved by:  Nothing Worsened by:  Nothing Ineffective treatments:  None tried Associated symptoms: blurred vision, nausea, numbness, photophobia, tingling (in left face and left arm), visual change and vomiting   Associated symptoms: no abdominal pain, no back pain, no congestion, no cough, no diarrhea, no dizziness, no fever, no focal weakness, no near-syncope, no neck pain, no neck stiffness, no paresthesias and no weakness   Nausea:    Severity:  Moderate   Progression:  Partially resolved Visual Change:    Location:  L eye   Quality: blurred vision     Onset quality:  Gradual   Duration:  10 days   Timing:  Constant Vomiting:    Quality:  Bright red blood   Severity:  Mild   Timing:  Sporadic   Progression:  Resolved   Past Medical History  Diagnosis Date  . Chronic kidney disease     Stage IV  . Hypokalemia   . Thrombocytopenia   . Hypertension 01/2015.    Hypertensive emergency.  . Hepatitis C antibody test positive 01/25/2015    HIV testing negative on the same date.  . Elevated troponin I level 01/2015.    Felt to be secondary to demand ischemia in setting of hypertension and cocaine use. Cardiac cath not planned given kidney disease  . Vitamin D deficiency 01/2015.  . Barrett's esophagus 02/05/2015    EGD 01/2015 - anticipate repeat EGD 2019   Past Surgical History  Procedure Laterality Date  . Esophagogastroduodenoscopy N/A 01/27/2015    Procedure:  ESOPHAGOGASTRODUODENOSCOPY (EGD);  Surgeon: Iva Booparl E Gessner, MD;  [facility];  Service: Endoscopy;  Laterality: N/A;   No family history on file. History  Substance Use Topics  . Smoking status: Current Every Day Smoker -- 0.50 packs/day    Types: Cigarettes  . Smokeless tobacco: Never Used  . Alcohol Use: Yes    Review of Systems  Constitutional: Negative for fever, chills, diaphoresis and appetite change.   HENT: Negative for congestion.   Eyes: Positive for blurred vision, photophobia and visual disturbance.  Respiratory: Negative for cough, chest tightness, shortness of breath and stridor.   Cardiovascular: Negative for chest pain, palpitations and near-syncope.  Gastrointestinal: Positive for nausea and vomiting. Negative for abdominal pain, diarrhea and constipation.  Genitourinary: Negative for flank pain.  Musculoskeletal: Negative for back pain, neck pain and neck stiffness.  Skin: Negative for rash and wound.  Neurological: Positive for numbness and headaches. Negative for dizziness, focal weakness, facial asymmetry, speech difficulty, weakness and paresthesias.  Psychiatric/Behavioral: Negative for confusion and agitation.  All other systems reviewed and are negative.     Allergies  Review of patient's allergies indicates no known allergies.  Home Medications   Prior to Admission medications   Medication Sig Start Date End Date Taking? Authorizing Provider  aspirin EC 81 MG EC tablet Take 1 tablet (81 mg total) by mouth daily. 01/29/15   Su Hoffarly J Rivet, MD  carvedilol (COREG) 6.25 MG tablet Take 1 tablet (6.25 mg total) by mouth 2 (two) times daily with a meal. 01/29/15   Carly J Rivet, MD  pantoprazole (PROTONIX) 40 MG tablet Take 1 tablet (40 mg total) by mouth 2 (two) times daily. 01/29/15   Su Hoffarly J Rivet, MD  sevelamer carbonate (RENVELA) 800 MG tablet Take 1 tablet (800 mg total) by mouth 3 (three) times daily with meals. 01/29/15   Carly Arlyce HarmanJ Rivet, MD  verapamil (CALAN) 120 MG tablet Take 1 tablet (120 mg total) by mouth every 8 (eight) hours. 01/29/15   Su Hoffarly J Rivet, MD  Vitamin D, Ergocalciferol, (DRISDOL) 50000 UNITS CAPS capsule Take 1 capsule (50,000 Units total) by mouth every 7 (seven) days. TAKE ONE PILL EVERY WEDNESDAY 01/29/15   Carly J Rivet, MD   BP 232/140 mmHg  Pulse 83  Temp(Src) 97.8 F (36.6 C) (Oral)  Resp 23  Ht 5\' 6"  (1.676 m)  Wt 115 lb (52.164 kg)  BMI  18.57 kg/m2  SpO2 99% Physical Exam  Constitutional: He is oriented to person, place, and time. He appears well-developed and well-nourished. No distress.  HENT:  Head: Normocephalic and atraumatic.  Mouth/Throat: No oropharyngeal exudate.  Eyes: EOM are normal. Right eye exhibits normal extraocular motion and no nystagmus. Left eye exhibits normal extraocular motion and no nystagmus. Right pupil is reactive. Left pupil is reactive. Pupils are unequal.  L pupil is slightly larger compared to R eye. Both are reactive to light.   Visual field deficit in L eye lateral superior field.   EOMI.   Neck: Normal range of motion. Neck supple.  Cardiovascular: Normal rate, regular rhythm, normal heart sounds and intact distal pulses.   No murmur heard. Pulmonary/Chest: Effort normal and breath sounds normal. No stridor. No respiratory distress. He has no wheezes. He has no rales. He exhibits no tenderness.  Abdominal: Soft. Bowel sounds are normal. He exhibits no distension. There is no tenderness. There is no rebound and no guarding.  Musculoskeletal: He exhibits no tenderness.  Neurological: He is alert and oriented to person,  place, and time. He has normal reflexes. He is not disoriented. A cranial nerve deficit and sensory deficit is present. He exhibits normal muscle tone. Coordination normal. GCS eye subscore is 4. GCS verbal subscore is 5. GCS motor subscore is 6.  Pt reports decreased sensation in L face and L arm.   Skin: Skin is warm. He is not diaphoretic. No pallor.  Psychiatric: He exhibits a depressed mood. He expresses suicidal ideation. He expresses no homicidal ideation.  Nursing note and vitals reviewed.   ED Course  Procedures (including critical care time) Labs Review Labs Reviewed  CBC WITH DIFFERENTIAL/PLATELET - Abnormal; Notable for the following:    RBC 3.18 (*)    Hemoglobin 9.3 (*)    HCT 28.7 (*)    Neutrophils Relative % 83 (*)    Lymphocytes Relative 9 (*)     All other components within normal limits  COMPREHENSIVE METABOLIC PANEL - Abnormal; Notable for the following:    Chloride 97 (*)    BUN 25 (*)    Creatinine, Ser 3.24 (*)    Calcium 8.8 (*)    GFR calc non Af Amer 19 (*)    GFR calc Af Amer 22 (*)    All other components within normal limits  URINALYSIS, ROUTINE W REFLEX MICROSCOPIC - Abnormal; Notable for the following:    Hgb urine dipstick TRACE (*)    Protein, ur 100 (*)    All other components within normal limits  URINE CULTURE  ETHANOL  URINE RAPID DRUG SCREEN (HOSP PERFORMED)  URINE MICROSCOPIC-ADD ON  SALICYLATE LEVEL  ACETAMINOPHEN LEVEL  I-STAT TROPOININ, ED    Imaging Review No results found.   EKG Interpretation   Date/Time:  Tuesday Feb 08 2015 08:53:32 EDT Ventricular Rate:  80 PR Interval:  138 QRS Duration: 100 QT Interval:  396 QTC Calculation: 457 R Axis:   64 Text Interpretation:  Sinus rhythm Biatrial enlargement LVH with secondary  repolarization abnormality Anterior ST elevation, probably due to LVH  Baseline wander in lead(s) V3 TWI improved since previous  Confirmed by  YAO  MD, DAVID ([id number]) on 02/08/2015 10:04:18 AM      MDM   Lee Swanson is a 64 y.o. male with a past medical history significant for hepatitis C, chronic kidney disease, hypertension, NSTEMI, barrett's esophagus, and recent admission for hypertensive emergency who presents with headache and vision changes. The patient denied any complaints aside from his headache with left eye blurry vision. The patient denied any further nausea, vomiting, or any other pains. The patient's physical exam was only remarkable for an his cornea with his left pupil slightly larger than his right, and difference in sensation with slight numbness in the left face and left arm. The patient reports mild associated photophobia. Given the patient's elevated blood pressure on arrival with a blood pressure greater than 200 systolic in the setting of  headache and neurological findings on exam, there is concern for possible hypertensive emergency with head bleed. The patient was given his oral blood pressure medications and had a diagnostic workup including a CT of the head and lab testing. The patient denied any cocaine or alcohol use since discharge.  On further evaluation, the patient reports suicidal ideation but does not currently have a plan. The patient says that he has nothing to go home to and feels that if he goes home, he will kill himself. Will add on Tylenol and salicylate testing and will plan on consult psychiatry after his  other laboratory testing returns.    Psychiatry team consulted. Patient is medically clear for further psychiatric management as the patient's diagnostic results have been similar or improved from prior. The patient's CT of the head did not show any acute abnormality, only chronic changes. The patient's creatinine was improved from prior. The patient's UDS was unremarkable and his urine did not show evidence of infection.  Plan to follow up on psychiatric recommendations.   Final diagnoses:  Suicidal ideation      Theda Belfast, MD 02/08/15 1559  Richardean Canal, MD 02/12/15 1030

## 2015-02-08 NOTE — ED Notes (Signed)
Patient stating his head feels much better after getting motrin and his dinner tray. Sitter at bedside. Pt calm, cooperative and pleasant.

## 2015-02-08 NOTE — ED Notes (Signed)
Dr. Yao at bedside. 

## 2015-02-08 NOTE — Progress Notes (Signed)
CSW seeking inpatient placement for pt.  Faxed referral to: [place]- per Belinda BlockMargaret [facility]- per Lawson Radararlene Davis- per Sheron Nightingaleracy [place]- per Larna DaughtersEvelyn [facility]- per Jamesetta SoPhyllis  At capacity: [facility]- [facility] [place]- per Baker Janusayla [place]- [facility] [place]- per [facility] per Aggie Cosierheresa  No response, left voicemail: [facility]  Ilean SkillMeghan Jaclin Finks, MSW, [facility] HamiltonCSWA Clinical Social Work, Disposition  02/08/2015 [phone]

## 2015-02-08 NOTE — ED Notes (Addendum)
Pt informed MD of suicidal ideation. Pt denies SI to this RN earlier. Pt endorses SI denies HI or plan. Sts feels like last time he was here he got no help from social work and just cant do it anymore. Dahlia ClientHannah SW informed after triage by Maxine GlennNikki RN to see pt for assistance with medications. Pt sts SW hasn't seen pt yet

## 2015-02-08 NOTE — BH Assessment (Addendum)
Assessment Note  Lee Swanson is an 64 y.o. male that presents to [facility] for BP as pt did not fill his BP medication.  Pt medically cleared per EDP Yao at [facility].  Pt reports SI with plan to kill himself "with a material that I don't know yet, but I am working on it."  Pt stated he has had SI with a plan for one week "because I am tired of living this way.  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  He stated he doesn't know where his daughters are and he is divorced.  The rest of his family is deceased.  Pt lives in a rooming house.  Pt has no previous mental health treatment.  He is not prescribed psychotropic medications.  Pt denies hx of SI or self-harm in past.  Pt denies HI or SA.  Pt deneie AVH, no delusions noted.  Pt cooeperative, oriented x 3, had good eye contact, asking for help, normal speech, logical/coherent thought processes, depressed mood, appropriate affect. Inpatient psychiatric treatment is recommended for increasing depression, SI and plan.  Consulted with Claudette Head, DNP who recommended gero placement for the pt.  Updated EDP Silverio Lay, who stated pt doesn't necessarily need gero placement, but a regular psych inpatient placement.  TTS to seek placement for the pt.  Updated ED and TTS staff.   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 Chronic kidney disease     Stage IV  . Hypokalemia   . Thrombocytopenia   . Hypertension 01/2015.    Hypertensive emergency.  . Hepatitis C antibody test positive 01/25/2015    HIV testing negative on the same date.  . Elevated troponin I level 01/2015.    Felt to be secondary to demand ischemia in setting of hypertension and cocaine use. Cardiac cath not  planned given kidney disease  . Vitamin D deficiency 01/2015.  . Barrett's esophagus 02/05/2015    EGD 01/2015 - anticipate repeat EGD 2019   Axis IV: economic problems, housing problems, occupational problems, other psychosocial or environmental problems, problems with access to health care services and problems with primary support group Axis V: 21-30 behavior considerably influenced by delusions or hallucinations OR serious impairment in judgment, communication OR inability to function in almost all areas  Past Medical History:  Past Medical History  Diagnosis Date  . Chronic kidney disease     Stage IV  . Hypokalemia   . Thrombocytopenia   . Hypertension 01/2015.    Hypertensive emergency.  . Hepatitis C antibody test positive 01/25/2015    HIV testing negative on the same date.  . Elevated troponin I level 01/2015.    Felt to be secondary to demand ischemia in setting of hypertension and cocaine use. Cardiac cath not planned given kidney disease  . Vitamin D deficiency 01/2015.  . Barrett's esophagus 02/05/2015    EGD 01/2015 - anticipate repeat EGD 2019    Past Surgical History  Procedure Laterality Date  . Esophagogastroduodenoscopy N/A 01/27/2015    Procedure: ESOPHAGOGASTRODUODENOSCOPY (EGD);  Surgeon: Iva Boop, MD;  Location: [facility] ENDOSCOPY;  Service: Endoscopy;  Laterality: N/A;    Family History: No family history on  file.  Social History:  reports that he has been smoking Cigarettes.  He has been smoking about 0.50 packs per day. He has never used smokeless tobacco. He reports that he drinks alcohol. He reports that he uses illicit drugs (Cocaine).  Additional Social History:  Alcohol / Drug Use Pain Medications: see med list Prescriptions: see med list Over the Counter: see med list History of alcohol / drug use?: No history of alcohol / drug abuse Longest period of sobriety (when/how long):  (na) Negative Consequences of Use:  (na) Withdrawal Symptoms:   (na)  CIWA: CIWA-Ar BP: 139/97 mmHg Pulse Rate: 72 COWS:    Allergies: No Known Allergies  Home Medications:  (Not in a hospital admission)  OB/GYN Status:  No LMP for male patient.  General Assessment Data [facility] Assessment: In system Is this a Tele or Face-to-Face Assessment?: Tele Assessment Is this an Initial Assessment or a Re-assessment for this encounter?: Initial Assessment Marital status: Divorced [place]:  (na) Is patient pregnant?: Other (Comment) (na) Pregnancy Status: Other (Comment) (na) Living Arrangements: Other (Comment) (lives at a rooming house) Can pt return to current living arrangement?: Yes Admission Status: Voluntary Is patient capable of signing voluntary admission?: Yes Referral Source: Self/Family/Friend Insurance type: None  Medical Screening Exam [facility] Walk-in ONLY) Medical Exam completed: No Reason for MSE not completed:  (na[facility])  Crisis Care Plan Living Arrangements: Other (Comment) (lives at a rooming house) Name of Psychiatrist: nonw Name of Therapist: nonw  Education Status Is patient currently in school?: No Highest grade of school patient has completed: HS grad  Risk to self with the past 6 months Suicidal Ideation: Yes-Currently Present Has patient been a risk to self within the past 6 months prior to admission? : Yes Suicidal Intent: Yes-Currently Present Has patient had any suicidal intent within the past 6 months prior to admission? : Yes Is patient at risk for suicide?: Yes Suicidal Plan?: Yes-Currently Present Has patient had any suicidal plan within the past 6 months prior to admission? : Yes Specify Current Suicidal Plan: to find material to harm self, [facility] Access to Means: Yes Specify Access to Suicidal Means: can access material to harm self What has been your use of drugs/alcohol within the last 12 months?: na - pt denies Previous  Attempts/Gestures: No How many times?: 0 Other Self Harm Risks: na - pt denies Triggers for Past Attempts: None known Intentional Self Injurious Behavior: None Family Suicide History: No Recent stressful life event(s): Recent negative physical changes, Other (Comment) (SI, medical, no support, financial) Persecutory voices/beliefs?: No Depression: Yes Depression Symptoms: Despondent, Insomnia, Tearfulness, Isolating, Fatigue, Loss of interest in usual pleasures, Feeling worthless/self pity, Feeling angry/irritable Substance abuse history and/or treatment for substance abuse?: No Suicide prevention information given to non-admitted patients: Not applicable  Risk to Others within the past 6 months Homicidal Ideation: No Does patient have any lifetime risk of violence toward others beyond the six months prior to admission? : No Thoughts of Harm to Others: No Current Homicidal Intent: No Current Homicidal Plan: No Access to Homicidal Means: No Identified Victim: na - tp denies History of harm to others?: No Assessment of Violence: None Noted Violent Behavior Description: na - tp calm, cooeprative Does patient have access to weapons?: No Criminal Charges Pending?: No Does patient have a court date: No Is patient on probation?: No  Psychosis Hallucinations: None noted Delusions: None noted  Mental Status Report Appearance/Hygiene: In scrubs Eye Contact: Good Motor Activity: Freedom of movement, Unremarkable Speech: Logical/coherent Level of Consciousness: Alert Mood: Depressed, Sad Affect: Depressed, Sad Anxiety Level: Moderate Thought Processes: Coherent, Relevant Judgement: Impaired Orientation: Person, Place, Situation Obsessive Compulsive Thoughts/Behaviors: None  Cognitive Functioning Concentration: Decreased Memory: Recent Impaired, Remote Intact IQ: Average Insight: Fair Impulse Control: Fair Appetite: Poor Weight Loss:  ("a whole lot") Weight Gain: 0 Sleep:  Decreased Total Hours of Sleep:  (reports not sleeping) Vegetative Symptoms: Staying in bed  ADLScreening [facility] Assessment Services) Patient's cognitive ability adequate to safely complete daily activities?: Yes Patient able to express need for assistance with ADLs?: Yes Independently performs ADLs?: Yes (appropriate for developmental age)  Prior Inpatient Therapy Prior Inpatient Therapy: No Prior Therapy Dates: na Prior Therapy Facilty/Provider(s): na Reason for Treatment: na  Prior Outpatient Therapy Prior Outpatient Therapy: No Prior Therapy Dates: na Prior Therapy Facilty/Provider(s): na Reason for Treatment: na Does patient have an ACCT team?: No Does patient have Intensive In-House Services?  : No Does patient have [facility] services? : No Does patient have P4CC services?: No  ADL Screening (condition at time of admission) Patient's cognitive ability adequate to safely complete daily activities?: Yes Is the patient deaf or have difficulty hearing?: No Does the patient have difficulty seeing, even when wearing glasses/contacts?: No Does the patient have difficulty concentrating, remembering, or making decisions?: Yes Patient able to express need for assistance with ADLs?: Yes Does the patient have difficulty dressing or bathing?: No Independently performs ADLs?: Yes (appropriate for developmental age) Does the patient have difficulty walking or climbing stairs?: No  Home Assistive Devices/Equipment Home Assistive Devices/Equipment: None    Abuse/Neglect Assessment (Assessment to be complete while patient is alone) Physical Abuse: Denies Verbal Abuse: Denies Sexual Abuse: Denies Exploitation of patient/patient's resources: Denies Self-Neglect: Denies Values / Beliefs Cultural Requests During Hospitalization: None Spiritual Requests During Hospitalization: None Consults Spiritual Care Consult Needed: No Social Work Consult Needed: No Merchant navy officer (For  Healthcare) Does patient have an advance directive?: No Would patient like information on creating an advanced directive?: No - patient declined information    Additional Information 1:1 In Past 12 Months?: No CIRT Risk: No Elopement Risk: No Does patient have medical clearance?: Yes     Disposition:  Disposition Initial Assessment Completed for this Encounter: Yes Disposition of Patient: Referred to, Inpatient treatment program Type of inpatient treatment program: Adult  On Site Evaluation by:   Reviewed with Physician:    Casimer Lanius, MS, [facility] Therapeutic Triage Specialist [place]   02/08/2015 11:55 AM

## 2015-02-08 NOTE — ED Notes (Signed)
Resident MD at bedside.

## 2015-02-08 NOTE — BH Assessment (Signed)
[facility] Assessment Progress Note Called and scheduled pt's tele assessment with this clinician.  Called EDP Yao and gathered clinical information on the pt as well.  Casimer LaniusKristen Tamika Nou, MS, [facility] Therapeutic Triage Specialist [facility]

## 2015-02-08 NOTE — Progress Notes (Signed)
[place] referred to [place], per Sheron, geri bed open.  CSW will continue to seek placement.  [place] Abtsatia Amirr Achord, LCSWA Disposition staff 02/08/2015 8:52 PM

## 2015-02-08 NOTE — ED Notes (Signed)
Per EMS- pt was seen here recently for same and given BP medications that he did not fill. Pt reports that he has dizziness, headache, and blurred vision in the left eye that has been ongoing since he was last admitted on May 9th. Pt alert and oriented.

## 2015-02-08 NOTE — ED Notes (Signed)
Belenda CruiseKristin, counselor assessing pt via telepsych

## 2015-02-08 NOTE — ED Notes (Signed)
Dinner tray at bedside

## 2015-02-09 DIAGNOSIS — R45851 Suicidal ideations: Secondary | ICD-10-CM

## 2015-02-09 DIAGNOSIS — F329 Major depressive disorder, single episode, unspecified: Secondary | ICD-10-CM

## 2015-02-09 LAB — URINE CULTURE
Colony Count: NO GROWTH
Culture: NO GROWTH

## 2015-02-09 NOTE — ED Notes (Signed)
Pt resting in bed watching TV

## 2015-02-09 NOTE — Discharge Instructions (Signed)
Follow up with out pt tx °

## 2015-02-09 NOTE — Progress Notes (Signed)
Spoke with [name] Withrow, DNP, who has evaluated pt today and recommends he be discharged as he no longer meets inpatient criteria.  Spoke with [facility] RN to update re: pt's disposition.   Ilean SkillMeghan Kathleen Tamm, MSW, LCSWA Clinical Social Work, Disposition  02/09/2015 [phone]

## 2015-02-09 NOTE — ED Notes (Signed)
[name] pt is cleared to be discharged by psych, [name] withrow.

## 2015-02-09 NOTE — ED Notes (Signed)
Sitter at bedside. Watching tv resting in bed.

## 2015-02-09 NOTE — ED Notes (Signed)
Pt resting comfortably in bed

## 2015-02-09 NOTE — ED Notes (Signed)
Tele Psych set up in pt room 

## 2015-02-09 NOTE — Consult Note (Signed)
Telepsych Consultation   Reason for Consult:  Suicidal Ideation when not given meds Referring Physician:  EDP Patient Identification: ETIENNE MOWERS MRN:  [id number] Principal Diagnosis: Suicidal ideation Diagnosis:   Patient Active Problem List   Diagnosis Date Noted  . Suicidal ideation [R45.851]     Priority: High  . Barrett's esophagus [K22.70] 02/05/2015  . Heme + stool [R19.5]   . SOB (shortness of breath) [R06.02]   . Alcohol-induced acute pancreatitis [K85.2]   . Hematemesis with nausea [K92.0, R11.0]   . Protein-calorie malnutrition, severe [E43] 01/25/2015  . Hypertensive emergency [I10] 01/24/2015  . Hypokalemia [E87.6] 01/24/2015  . AKI (acute kidney injury) [N17.9] 01/24/2015  . CKD (chronic kidney disease) stage 4, GFR 15-29 ml/min [N18.4] 01/24/2015  . Thrombocytopenia [D69.6] 01/24/2015  . Cocaine abuse [F14.10] 01/24/2015  . LVH (left ventricular hypertrophy) [I51.7] 01/24/2015  . Abdominal pain [R10.9] 01/24/2015  . Nausea and vomiting [R11.2] 01/24/2015  . Weight loss [R63.4] 01/24/2015  . Tobacco abuse [Z72.0] 01/24/2015  . Chest pain [R07.9] 01/24/2015  . Headache [R51] 01/24/2015  . Syncope [R55] 01/24/2015  . Elevated troponin [R79.89] 01/24/2015  . Enlarged prostate [N40.0] 01/24/2015    Total Time spent with patient: 50 minutes  Subjective:   Lee Swanson is a 64 y.o. male patient admitted with reports of suicidal ideation secondary to frustration about lacking transportation and resources to fill his prescriptions from his psychiatrist. Pt reports that he wants to take his medications and he only reported suicidality to get his medications given to him. Pt now denies suicidal/homicidal ideation and psychosis and does not appear to be responding to internal stimuli. Pt reports that he has had problems with seeing shadows but not since yesterday and that they resolved when the ED gave him his medications. Pt would like to go fill his scripts that  he already has and only needs transportation.   HPI:  I have reviewed EDP notes below and concur with changes as seen:  Lee Swanson is an 64 y.o. male that presents to [facility] for BP as pt did not fill his BP medication. Pt medically cleared per EDP Yao at [facility]. Pt reports SI with plan to kill himself "with a material that I don't know yet, but I am working on it." Pt stated he has had SI with a plan for one week "because I am tired of living this way. 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 He stated he doesn't know where his daughters are and he is divorced. The rest of his family is deceased. Pt lives in a rooming house. Pt has no previous mental health treatment. He is not prescribed psychotropic medications. Pt denies hx of SI or self-harm in past. Pt denies HI or SA. Pt deneie AVH, no delusions noted. Pt cooeperative, oriented x 3, had good eye contact, asking for help, normal speech, logical/coherent thought processes, depressed mood, appropriate affect. Inpatient psychiatric treatment is recommended for increasing depression, SI and plan. Consulted with Catalina Pizza, DNP who recommended gero placement for the pt. Updated EDP Darl Householder, who stated pt doesn't necessarily need gero placement, but a regular psych inpatient placement. TTS to seek placement for the pt. Updated ED and TTS staff.    Past Medical History:  Past  Medical History  Diagnosis Date  . Chronic kidney disease     Stage IV  . Hypokalemia   . Thrombocytopenia   . Hypertension 01/2015.    Hypertensive emergency.  . Hepatitis C antibody test positive 01/25/2015    HIV testing negative on the same date.  . Elevated troponin I level 01/2015.    Felt to be secondary to demand ischemia in setting of  hypertension and cocaine use. Cardiac cath not planned given kidney disease  . Vitamin D deficiency 01/2015.  . Barrett's esophagus 02/05/2015    EGD 01/2015 - anticipate repeat EGD 2019    Past Surgical History  Procedure Laterality Date  . Esophagogastroduodenoscopy N/A 01/27/2015    Procedure: ESOPHAGOGASTRODUODENOSCOPY (EGD);  Surgeon: Gatha Mayer, MD;  Location[facility] ENDOSCOPY;  Service: Endoscopy;  Laterality: N/A;   Family History: No family history on file. Social History:  History  Alcohol Use  . Yes     History  Drug Use  . Yes  . Special: Cocaine    History   Social History  . Marital Status: Unknown    Spouse Name: N/A  . Number of Children: N/A  . Years of Education: N/A   Social History Main Topics  . Smoking status: Current Every Day Smoker -- 0.50 packs/day    Types: Cigarettes  . Smokeless tobacco: Never Used  . Alcohol Use: Yes  . Drug Use: Yes    Special: Cocaine  . Sexual Activity: Not on file   Other Topics Concern  . 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   Drinks 1-2 beers qd   Denies kids   His siblings and other family is deceased had 2 sisters and 2 brothers   Smokes 1ppd    Denies cocaine use though UDS + 01/2015    Additional Social History:    Pain Medications: see med list Prescriptions: see med list Over the Counter: see med list History of alcohol / drug use?: No history of alcohol / drug abuse Longest period of sobriety (when/how long):  (na) Negative Consequences of Use:  (na) Withdrawal Symptoms:  (na)                     Allergies:  No Known Allergies  Labs:  Results for orders placed or performed during the hospital encounter of 02/08/15 (from the past 48 hour(s))  Urine culture     Status: None   Collection Time: 02/08/15  8:52 AM  Result Value Ref Range   Specimen Description  URINE, RANDOM    Special Requests NONE    Colony Count NO GROWTH Performed at [facility]     Culture NO GROWTH Performed at [facility]     Report Status 02/09/2015 FINAL   Urinalysis, Routine w reflex microscopic     Status: Abnormal   Collection Time: 02/08/15  8:52 AM  Result Value Ref Range   Color, Urine YELLOW YELLOW   APPearance CLEAR CLEAR   Specific Gravity, Urine 1.008 1.005 - 1.030   pH 7.5 5.0 - 8.0   Glucose, UA NEGATIVE NEGATIVE mg/dL   Hgb urine dipstick TRACE (A) NEGATIVE   Bilirubin Urine NEGATIVE NEGATIVE   Ketones, ur NEGATIVE NEGATIVE mg/dL   Protein, ur 100 (A) NEGATIVE mg/dL  Urobilinogen, UA 0.2 0.0 - 1.0 mg/dL   Nitrite NEGATIVE NEGATIVE   Leukocytes, UA NEGATIVE NEGATIVE  Drug screen panel, emergency     Status: None   Collection Time: 02/08/15  8:52 AM  Result Value Ref Range   Opiates NONE DETECTED NONE DETECTED   Cocaine NONE DETECTED NONE DETECTED   Benzodiazepines NONE DETECTED NONE DETECTED   Amphetamines NONE DETECTED NONE DETECTED   Tetrahydrocannabinol NONE DETECTED NONE DETECTED   Barbiturates NONE DETECTED NONE DETECTED    Comment:        DRUG SCREEN FOR MEDICAL PURPOSES ONLY.  IF CONFIRMATION IS NEEDED FOR ANY PURPOSE, NOTIFY LAB WITHIN 5 DAYS.        LOWEST DETECTABLE LIMITS FOR URINE DRUG SCREEN Drug Class       Cutoff (ng/mL) Amphetamine      1000 Barbiturate      200 Benzodiazepine   378 Tricyclics       588 Opiates          300 Cocaine          300 THC              50   Urine microscopic-add on     Status: None   Collection Time: 02/08/15  8:52 AM  Result Value Ref Range   WBC, UA 0-2 <3 WBC/hpf   RBC / HPF 3-6 <3 RBC/hpf  I-Stat Troponin, ED - 0, 3, 6 hours (not at [facility])     Status: None   Collection Time: 02/08/15  8:59 AM  Result Value Ref Range   Troponin i, poc 0.04 0.00 - 0.08 ng/mL   Comment 3            Comment: Due to the release kinetics of cTnI, a negative result within the first  hours of the onset of symptoms does not rule out myocardial infarction with certainty. If myocardial infarction is still suspected, repeat the test at appropriate intervals.   CBC with Differential     Status: Abnormal   Collection Time: 02/08/15  9:19 AM  Result Value Ref Range   WBC 7.9 4.0 - 10.5 K/uL   RBC 3.18 (L) 4.22 - 5.81 MIL/uL   Hemoglobin 9.3 (L) 13.0 - 17.0 g/dL   HCT 28.7 (L) 39.0 - 52.0 %   MCV 90.3 78.0 - 100.0 fL   MCH 29.2 26.0 - 34.0 pg   MCHC 32.4 30.0 - 36.0 g/dL   RDW 13.8 11.5 - 15.5 %   Platelets 215 150 - 400 K/uL   Neutrophils Relative % 83 (H) 43 - 77 %   Neutro Abs 6.5 1.7 - 7.7 K/uL   Lymphocytes Relative 9 (L) 12 - 46 %   Lymphs Abs 0.7 0.7 - 4.0 K/uL   Monocytes Relative 8 3 - 12 %   Monocytes Absolute 0.6 0.1 - 1.0 K/uL   Eosinophils Relative 0 0 - 5 %   Eosinophils Absolute 0.0 0.0 - 0.7 K/uL   Basophils Relative 0 0 - 1 %   Basophils Absolute 0.0 0.0 - 0.1 K/uL  Comprehensive metabolic panel     Status: Abnormal   Collection Time: 02/08/15  9:19 AM  Result Value Ref Range   Sodium 135 135 - 145 mmol/L   Potassium 4.0 3.5 - 5.1 mmol/L   Chloride 97 (L) 101 - 111 mmol/L   CO2 26 22 - 32 mmol/L   Glucose, Bld 90 65 - 99 mg/dL   BUN 25 (H) 6 -  20 mg/dL   Creatinine, Ser 3.24 (H) 0.61 - 1.24 mg/dL   Calcium 8.8 (L) 8.9 - 10.3 mg/dL   Total Protein 7.0 6.5 - 8.1 g/dL   Albumin 3.5 3.5 - 5.0 g/dL   AST 39 15 - 41 U/L   ALT 33 17 - 63 U/L   Alkaline Phosphatase 85 38 - 126 U/L   Total Bilirubin 0.7 0.3 - 1.2 mg/dL   GFR calc non Af Amer 19 (L) >60 mL/min   GFR calc Af Amer 22 (L) >60 mL/min    Comment: (NOTE) The eGFR has been calculated using the CKD EPI equation. This calculation has not been validated in all clinical situations. eGFR's persistently <60 mL/min signify possible Chronic Kidney Disease.    Anion gap 12 5 - 15  Ethanol     Status: None   Collection Time: 02/08/15  9:19 AM  Result Value Ref Range   Alcohol, Ethyl (B) <5  <5 mg/dL    Comment:        LOWEST DETECTABLE LIMIT FOR SERUM ALCOHOL IS 11 mg/dL FOR MEDICAL PURPOSES ONLY   Salicylate level     Status: None   Collection Time: 02/08/15  9:19 AM  Result Value Ref Range   Salicylate Lvl <3.0 2.8 - 30.0 mg/dL  Acetaminophen level     Status: Abnormal   Collection Time: 02/08/15  9:19 AM  Result Value Ref Range   Acetaminophen (Tylenol), Serum <10 (L) 10 - 30 ug/mL    Comment:        THERAPEUTIC CONCENTRATIONS VARY SIGNIFICANTLY. A RANGE OF 10-30 ug/mL MAY BE AN EFFECTIVE CONCENTRATION FOR MANY PATIENTS. HOWEVER, SOME ARE BEST TREATED AT CONCENTRATIONS OUTSIDE THIS RANGE. ACETAMINOPHEN CONCENTRATIONS >150 ug/mL AT 4 HOURS AFTER INGESTION AND >50 ug/mL AT 12 HOURS AFTER INGESTION ARE OFTEN ASSOCIATED WITH TOXIC REACTIONS.     Vitals: Blood pressure 182/98, pulse 61, temperature 98.3 F (36.8 C), temperature source Oral, resp. rate 14, height 5' 6"  (1.676 m), weight 52.164 kg (115 lb), SpO2 100 %.  Risk to Self: Suicidal Ideation: Yes-Currently Present Suicidal Intent: Yes-Currently Present Is patient at risk for suicide?: Yes Suicidal Plan?: Yes-Currently Present Specify Current Suicidal Plan: to find material to harm self, sign over body parts to [place] Access to Means: Yes Specify Access to Suicidal Means: can access material to harm self What has been your use of drugs/alcohol within the last 12 months?: na - pt denies How many times?: 0 Other Self Harm Risks: na - pt denies Triggers for Past Attempts: None known Intentional Self Injurious Behavior: None Risk to Others: Homicidal Ideation: No Thoughts of Harm to Others: No Current Homicidal Intent: No Current Homicidal Plan: No Access to Homicidal Means: No Identified Victim: na - tp denies History of harm to others?: No Assessment of Violence: None Noted Violent Behavior Description: na - tp calm, cooeprative Does patient have access to weapons?: No Criminal Charges  Pending?: No Does patient have a court date: No Prior Inpatient Therapy: Prior Inpatient Therapy: No Prior Therapy Dates: na Prior Therapy Facilty/Provider(s): na Reason for Treatment: na Prior Outpatient Therapy: Prior Outpatient Therapy: No Prior Therapy Dates: na Prior Therapy Facilty/Provider(s): na Reason for Treatment: na Does patient have an ACCT team?: No Does patient have Intensive In-House Services?  : No Does patient have [facility] services? : No Does patient have P4CC services?: No  Current Facility-Administered Medications  Medication Dose Route Frequency Provider Last Rate Last Dose  . acetaminophen (TYLENOL) tablet 650  mg  650 mg Oral Q4H PRN Wandra Arthurs, MD   650 mg at 02/08/15 1408  . carvedilol (COREG) tablet 6.25 mg  6.25 mg Oral BID WC Antony Blackbird, MD   6.25 mg at 02/09/15 0839  . ibuprofen (ADVIL,MOTRIN) tablet 600 mg  600 mg Oral Q8H PRN Wandra Arthurs, MD   600 mg at 02/08/15 1752  . LORazepam (ATIVAN) tablet 1 mg  1 mg Oral Q8H PRN Wandra Arthurs, MD      . verapamil (CALAN) tablet 120 mg  120 mg Oral 3 times per day Antony Blackbird, MD   120 mg at 02/09/15 0454   Current Outpatient Prescriptions  Medication Sig Dispense Refill  . aspirin EC 81 MG EC tablet Take 1 tablet (81 mg total) by mouth daily. 30 tablet 3  . carvedilol (COREG) 6.25 MG tablet Take 1 tablet (6.25 mg total) by mouth 2 (two) times daily with a meal. 60 tablet 3  . pantoprazole (PROTONIX) 40 MG tablet Take 1 tablet (40 mg total) by mouth 2 (two) times daily. 30 tablet 3  . sevelamer carbonate (RENVELA) 800 MG tablet Take 1 tablet (800 mg total) by mouth 3 (three) times daily with meals. 90 tablet 3  . verapamil (CALAN) 120 MG tablet Take 1 tablet (120 mg total) by mouth every 8 (eight) hours. 90 tablet 3  . Vitamin D, Ergocalciferol, (DRISDOL) 50000 UNITS CAPS capsule Take 1 capsule (50,000 Units total) by mouth every 7 (seven) days. TAKE ONE PILL EVERY WEDNESDAY 7 capsule 0     Musculoskeletal: UTO, camera  Psychiatric Specialty Exam: Physical Exam  Review of Systems  Psychiatric/Behavioral: Positive for depression and hallucinations (not today, yesterday). Negative for suicidal ideas. The patient is nervous/anxious.   All other systems reviewed and are negative.   Blood pressure 182/98, pulse 61, temperature 98.3 F (36.8 C), temperature source Oral, resp. rate 14, height 5' 6"  (1.676 m), weight 52.164 kg (115 lb), SpO2 100 %.Body mass index is 18.57 kg/(m^2).  General Appearance: Casual and Fairly Groomed  Engineer, water::  Good  Speech:  Clear and Coherent and Normal Rate  Volume:  Normal  Mood:  Anxious  Affect:  Appropriate and Congruent  Thought Process:  Circumstantial and Goal Directed  Orientation:  Full (Time, Place, and Person)  Thought Content:  WDL  Suicidal Thoughts:  No  Homicidal Thoughts:  No  Memory:  Immediate;   Fair Recent;   Fair Remote;   Fair  Judgement:  Fair  Insight:  Good  Psychomotor Activity:  Normal  Concentration:  Good  Recall:  Good  Fund of Knowledge:Fair  Language: Good  Akathisia:  No  Handed:    AIMS (if indicated):     Assets:  Desire for Improvement Resilience Social Support  ADL's:  Intact  Cognition: WNL  Sleep:       Treatment Plan Summary: Suicidal ideation , resolved, secondary to pt demanding psychiatric medications  Disposition:  -Discharge home -Provide bus pass to retrieve medications -Ask pt to sign no-harm contract  Benjamine Mola, FNP-BC 02/09/2015 09:14 AM

## 2015-02-26 ENCOUNTER — Inpatient Hospital Stay (HOSPITAL_COMMUNITY)
Admission: EM | Admit: 2015-02-26 | Discharge: 2015-03-25 | DRG: 004 | Disposition: A | Discharge status: Skilled Nursing Facility | Payer: Medicaid Other | Attending: Internal Medicine | Admitting: Internal Medicine

## 2015-02-26 ENCOUNTER — Inpatient Hospital Stay (HOSPITAL_COMMUNITY): Payer: Medicaid Other

## 2015-02-26 ENCOUNTER — Emergency Department (HOSPITAL_COMMUNITY): Payer: Medicaid Other

## 2015-02-26 ENCOUNTER — Encounter (HOSPITAL_COMMUNITY): Payer: Self-pay | Admitting: Emergency Medicine

## 2015-02-26 DIAGNOSIS — I160 Hypertensive urgency: Secondary | ICD-10-CM

## 2015-02-26 DIAGNOSIS — I609 Nontraumatic subarachnoid hemorrhage, unspecified: Secondary | ICD-10-CM

## 2015-02-26 DIAGNOSIS — J690 Pneumonitis due to inhalation of food and vomit: Secondary | ICD-10-CM | POA: Diagnosis present

## 2015-02-26 DIAGNOSIS — R739 Hyperglycemia, unspecified: Secondary | ICD-10-CM | POA: Diagnosis present

## 2015-02-26 DIAGNOSIS — J9601 Acute respiratory failure with hypoxia: Secondary | ICD-10-CM | POA: Diagnosis present

## 2015-02-26 DIAGNOSIS — Z720 Tobacco use: Secondary | ICD-10-CM | POA: Diagnosis present

## 2015-02-26 DIAGNOSIS — N19 Unspecified kidney failure: Secondary | ICD-10-CM

## 2015-02-26 DIAGNOSIS — Z9119 Patient's noncompliance with other medical treatment and regimen: Secondary | ICD-10-CM | POA: Diagnosis present

## 2015-02-26 DIAGNOSIS — J9690 Respiratory failure, unspecified, unspecified whether with hypoxia or hypercapnia: Secondary | ICD-10-CM

## 2015-02-26 DIAGNOSIS — I10 Essential (primary) hypertension: Secondary | ICD-10-CM | POA: Diagnosis present

## 2015-02-26 DIAGNOSIS — J411 Mucopurulent chronic bronchitis: Secondary | ICD-10-CM | POA: Diagnosis present

## 2015-02-26 DIAGNOSIS — K210 Gastro-esophageal reflux disease with esophagitis: Secondary | ICD-10-CM | POA: Diagnosis present

## 2015-02-26 DIAGNOSIS — R0989 Other specified symptoms and signs involving the circulatory and respiratory systems: Secondary | ICD-10-CM

## 2015-02-26 DIAGNOSIS — Z9911 Dependence on respirator [ventilator] status: Secondary | ICD-10-CM

## 2015-02-26 DIAGNOSIS — I120 Hypertensive chronic kidney disease with stage 5 chronic kidney disease or end stage renal disease: Secondary | ICD-10-CM | POA: Diagnosis present

## 2015-02-26 DIAGNOSIS — I6783 Posterior reversible encephalopathy syndrome: Secondary | ICD-10-CM | POA: Diagnosis present

## 2015-02-26 DIAGNOSIS — N179 Acute kidney failure, unspecified: Secondary | ICD-10-CM | POA: Diagnosis not present

## 2015-02-26 DIAGNOSIS — S065X9A Traumatic subdural hemorrhage with loss of consciousness of unspecified duration, initial encounter: Secondary | ICD-10-CM | POA: Diagnosis present

## 2015-02-26 DIAGNOSIS — G9349 Other encephalopathy: Secondary | ICD-10-CM

## 2015-02-26 DIAGNOSIS — R633 Feeding difficulties: Secondary | ICD-10-CM

## 2015-02-26 DIAGNOSIS — I6340 Cerebral infarction due to embolism of unspecified cerebral artery: Secondary | ICD-10-CM | POA: Diagnosis present

## 2015-02-26 DIAGNOSIS — R1310 Dysphagia, unspecified: Secondary | ICD-10-CM

## 2015-02-26 DIAGNOSIS — R4182 Altered mental status, unspecified: Secondary | ICD-10-CM

## 2015-02-26 DIAGNOSIS — R6339 Other feeding difficulties: Secondary | ICD-10-CM

## 2015-02-26 DIAGNOSIS — D696 Thrombocytopenia, unspecified: Secondary | ICD-10-CM | POA: Diagnosis present

## 2015-02-26 DIAGNOSIS — Z930 Tracheostomy status: Secondary | ICD-10-CM | POA: Diagnosis present

## 2015-02-26 DIAGNOSIS — J150 Pneumonia due to Klebsiella pneumoniae: Secondary | ICD-10-CM | POA: Diagnosis not present

## 2015-02-26 DIAGNOSIS — R34 Anuria and oliguria: Secondary | ICD-10-CM | POA: Diagnosis present

## 2015-02-26 DIAGNOSIS — Z7982 Long term (current) use of aspirin: Secondary | ICD-10-CM

## 2015-02-26 DIAGNOSIS — R64 Cachexia: Secondary | ICD-10-CM | POA: Diagnosis present

## 2015-02-26 DIAGNOSIS — R404 Transient alteration of awareness: Secondary | ICD-10-CM

## 2015-02-26 DIAGNOSIS — Z681 Body mass index (BMI) 19 or less, adult: Secondary | ICD-10-CM

## 2015-02-26 DIAGNOSIS — F1410 Cocaine abuse, uncomplicated: Secondary | ICD-10-CM

## 2015-02-26 DIAGNOSIS — R45851 Suicidal ideations: Secondary | ICD-10-CM

## 2015-02-26 DIAGNOSIS — Z4659 Encounter for fitting and adjustment of other gastrointestinal appliance and device: Secondary | ICD-10-CM

## 2015-02-26 DIAGNOSIS — N189 Chronic kidney disease, unspecified: Secondary | ICD-10-CM

## 2015-02-26 DIAGNOSIS — Z9289 Personal history of other medical treatment: Secondary | ICD-10-CM

## 2015-02-26 DIAGNOSIS — F17210 Nicotine dependence, cigarettes, uncomplicated: Secondary | ICD-10-CM | POA: Diagnosis present

## 2015-02-26 DIAGNOSIS — N185 Chronic kidney disease, stage 5: Secondary | ICD-10-CM | POA: Diagnosis present

## 2015-02-26 DIAGNOSIS — B1920 Unspecified viral hepatitis C without hepatic coma: Secondary | ICD-10-CM | POA: Diagnosis present

## 2015-02-26 DIAGNOSIS — S065XAA Traumatic subdural hemorrhage with loss of consciousness status unknown, initial encounter: Secondary | ICD-10-CM

## 2015-02-26 DIAGNOSIS — D631 Anemia in chronic kidney disease: Secondary | ICD-10-CM | POA: Diagnosis present

## 2015-02-26 DIAGNOSIS — J9600 Acute respiratory failure, unspecified whether with hypoxia or hypercapnia: Secondary | ICD-10-CM

## 2015-02-26 DIAGNOSIS — I639 Cerebral infarction, unspecified: Secondary | ICD-10-CM

## 2015-02-26 DIAGNOSIS — E870 Hyperosmolality and hypernatremia: Secondary | ICD-10-CM | POA: Diagnosis present

## 2015-02-26 DIAGNOSIS — J189 Pneumonia, unspecified organism: Secondary | ICD-10-CM | POA: Diagnosis present

## 2015-02-26 DIAGNOSIS — I674 Hypertensive encephalopathy: Secondary | ICD-10-CM | POA: Diagnosis present

## 2015-02-26 DIAGNOSIS — I161 Hypertensive emergency: Secondary | ICD-10-CM | POA: Diagnosis present

## 2015-02-26 DIAGNOSIS — Z978 Presence of other specified devices: Secondary | ICD-10-CM

## 2015-02-26 DIAGNOSIS — R451 Restlessness and agitation: Secondary | ICD-10-CM | POA: Diagnosis not present

## 2015-02-26 DIAGNOSIS — N184 Chronic kidney disease, stage 4 (severe): Secondary | ICD-10-CM | POA: Diagnosis present

## 2015-02-26 DIAGNOSIS — G936 Cerebral edema: Secondary | ICD-10-CM | POA: Diagnosis present

## 2015-02-26 DIAGNOSIS — Z79899 Other long term (current) drug therapy: Secondary | ICD-10-CM | POA: Diagnosis not present

## 2015-02-26 DIAGNOSIS — I6200 Nontraumatic subdural hemorrhage, unspecified: Secondary | ICD-10-CM | POA: Diagnosis present

## 2015-02-26 DIAGNOSIS — K2270 Barrett's esophagus without dysplasia: Secondary | ICD-10-CM | POA: Diagnosis present

## 2015-02-26 DIAGNOSIS — I517 Cardiomegaly: Secondary | ICD-10-CM | POA: Diagnosis present

## 2015-02-26 DIAGNOSIS — J9811 Atelectasis: Secondary | ICD-10-CM

## 2015-02-26 DIAGNOSIS — E43 Unspecified severe protein-calorie malnutrition: Secondary | ICD-10-CM | POA: Diagnosis present

## 2015-02-26 DIAGNOSIS — D638 Anemia in other chronic diseases classified elsewhere: Secondary | ICD-10-CM | POA: Diagnosis present

## 2015-02-26 DIAGNOSIS — R401 Stupor: Secondary | ICD-10-CM

## 2015-02-26 DIAGNOSIS — R634 Abnormal weight loss: Secondary | ICD-10-CM | POA: Diagnosis present

## 2015-02-26 DIAGNOSIS — J95851 Ventilator associated pneumonia: Secondary | ICD-10-CM | POA: Diagnosis present

## 2015-02-26 DIAGNOSIS — Z0189 Encounter for other specified special examinations: Secondary | ICD-10-CM

## 2015-02-26 HISTORY — DX: Nontraumatic subarachnoid hemorrhage, unspecified: I60.9

## 2015-02-26 LAB — CBC WITH DIFFERENTIAL/PLATELET
BASOS ABS: 0 10*3/uL (ref 0.0–0.1)
BASOS PCT: 0 % (ref 0–1)
Basophils Absolute: 0 10*3/uL (ref 0.0–0.1)
Basophils Relative: 0 % (ref 0–1)
Eosinophils Absolute: 0 10*3/uL (ref 0.0–0.7)
Eosinophils Absolute: 0 10*3/uL (ref 0.0–0.7)
Eosinophils Relative: 0 % (ref 0–5)
Eosinophils Relative: 0 % (ref 0–5)
HCT: 40.5 % (ref 39.0–52.0)
HCT: 32.2 % — ABNORMAL LOW (ref 39.0–52.0)
Hemoglobin: 13.7 g/dL (ref 13.0–17.0)
Hemoglobin: 11.2 g/dL — ABNORMAL LOW (ref 13.0–17.0)
LYMPHS PCT: 7 % — AB (ref 12–46)
Lymphocytes Relative: 5 % — ABNORMAL LOW (ref 12–46)
Lymphs Abs: 0.4 10*3/uL — ABNORMAL LOW (ref 0.7–4.0)
Lymphs Abs: 0.3 10*3/uL — ABNORMAL LOW (ref 0.7–4.0)
MCH: 28.5 pg (ref 26.0–34.0)
MCH: 28.8 pg (ref 26.0–34.0)
MCHC: 33.8 g/dL (ref 30.0–36.0)
MCHC: 34.8 g/dL (ref 30.0–36.0)
MCV: 84.2 fL (ref 78.0–100.0)
MCV: 82.8 fL (ref 78.0–100.0)
MONO ABS: 0.3 10*3/uL (ref 0.1–1.0)
Monocytes Absolute: 0.3 10*3/uL (ref 0.1–1.0)
Monocytes Relative: 4 % (ref 3–12)
Monocytes Relative: 4 % (ref 3–12)
Neutro Abs: 6 10*3/uL (ref 1.7–7.7)
Neutro Abs: 6.4 10*3/uL (ref 1.7–7.7)
Neutrophils Relative %: 89 % — ABNORMAL HIGH (ref 43–77)
Neutrophils Relative %: 91 % — ABNORMAL HIGH (ref 43–77)
PLATELETS: 126 10*3/uL — AB (ref 150–400)
Platelets: 126 10*3/uL — ABNORMAL LOW (ref 150–400)
RBC: 4.81 MIL/uL (ref 4.22–5.81)
RBC: 3.89 MIL/uL — ABNORMAL LOW (ref 4.22–5.81)
RDW: 13.5 % (ref 11.5–15.5)
RDW: 13.5 % (ref 11.5–15.5)
WBC: 6.7 10*3/uL (ref 4.0–10.5)
WBC: 7 10*3/uL (ref 4.0–10.5)

## 2015-02-26 LAB — COMPREHENSIVE METABOLIC PANEL
ALBUMIN: 3.8 g/dL (ref 3.5–5.0)
ALBUMIN: 3.5 g/dL (ref 3.5–5.0)
ALK PHOS: 71 U/L (ref 38–126)
ALK PHOS: 62 U/L (ref 38–126)
ALT: 29 U/L (ref 17–63)
ALT: 26 U/L (ref 17–63)
ALT: 25 U/L (ref 17–63)
AST: 45 U/L — ABNORMAL HIGH (ref 15–41)
AST: 41 U/L (ref 15–41)
AST: 40 U/L (ref 15–41)
Albumin: 3.2 g/dL — ABNORMAL LOW (ref 3.5–5.0)
Alkaline Phosphatase: 58 U/L (ref 38–126)
Anion gap: 15 (ref 5–15)
Anion gap: 15 (ref 5–15)
Anion gap: 17 — ABNORMAL HIGH (ref 5–15)
BILIRUBIN TOTAL: 1.8 mg/dL — AB (ref 0.3–1.2)
BUN: 65 mg/dL — AB (ref 6–20)
BUN: 66 mg/dL — AB (ref 6–20)
BUN: 71 mg/dL — ABNORMAL HIGH (ref 6–20)
CALCIUM: 8.4 mg/dL — AB (ref 8.9–10.3)
CO2: 29 mmol/L (ref 22–32)
CO2: 25 mmol/L (ref 22–32)
CO2: 22 mmol/L (ref 22–32)
CREATININE: 4.71 mg/dL — AB (ref 0.61–1.24)
Calcium: 8.9 mg/dL (ref 8.9–10.3)
Calcium: 8.3 mg/dL — ABNORMAL LOW (ref 8.9–10.3)
Chloride: 91 mmol/L — ABNORMAL LOW (ref 101–111)
Chloride: 97 mmol/L — ABNORMAL LOW (ref 101–111)
Chloride: 97 mmol/L — ABNORMAL LOW (ref 101–111)
Creatinine, Ser: 4.41 mg/dL — ABNORMAL HIGH (ref 0.61–1.24)
Creatinine, Ser: 4.75 mg/dL — ABNORMAL HIGH (ref 0.61–1.24)
GFR calc Af Amer: 14 mL/min — ABNORMAL LOW (ref >60)
GFR calc non Af Amer: 13 mL/min — ABNORMAL LOW (ref >60)
GFR calc non Af Amer: 12 mL/min — ABNORMAL LOW (ref >60)
GFR, EST AFRICAN AMERICAN: 14 mL/min — AB (ref >60)
GFR, EST AFRICAN AMERICAN: 15 mL/min — AB (ref >60)
GFR, EST NON AFRICAN AMERICAN: 12 mL/min — AB (ref >60)
GLUCOSE: 114 mg/dL — AB (ref 65–99)
Glucose, Bld: 109 mg/dL — ABNORMAL HIGH (ref 65–99)
Glucose, Bld: 111 mg/dL — ABNORMAL HIGH (ref 65–99)
POTASSIUM: 3.7 mmol/L (ref 3.5–5.1)
POTASSIUM: 2.7 mmol/L — AB (ref 3.5–5.1)
Potassium: 3.2 mmol/L — ABNORMAL LOW (ref 3.5–5.1)
SODIUM: 135 mmol/L (ref 135–145)
Sodium: 137 mmol/L (ref 135–145)
Sodium: 136 mmol/L (ref 135–145)
Total Bilirubin: 1.9 mg/dL — ABNORMAL HIGH (ref 0.3–1.2)
Total Bilirubin: 1.6 mg/dL — ABNORMAL HIGH (ref 0.3–1.2)
Total Protein: 7.1 g/dL (ref 6.5–8.1)
Total Protein: 6.2 g/dL — ABNORMAL LOW (ref 6.5–8.1)
Total Protein: 5.8 g/dL — ABNORMAL LOW (ref 6.5–8.1)

## 2015-02-26 LAB — I-STAT CHEM 8, ED
BUN: 63 mg/dL — AB (ref 6–20)
CALCIUM ION: 1 mmol/L — AB (ref 1.13–1.30)
Chloride: 95 mmol/L — ABNORMAL LOW (ref 101–111)
Creatinine, Ser: 4.6 mg/dL — ABNORMAL HIGH (ref 0.61–1.24)
GLUCOSE: 113 mg/dL — AB (ref 65–99)
HCT: 46 % (ref 39.0–52.0)
Hemoglobin: 15.6 g/dL (ref 13.0–17.0)
POTASSIUM: 3.3 mmol/L — AB (ref 3.5–5.1)
Sodium: 136 mmol/L (ref 135–145)
TCO2: 28 mmol/L (ref 0–100)

## 2015-02-26 LAB — I-STAT ARTERIAL BLOOD GAS, ED
Acid-Base Excess: 1 mmol/L (ref 0.0–2.0)
Bicarbonate: 24.7 mEq/L — ABNORMAL HIGH (ref 20.0–24.0)
O2 Saturation: 100 %
Patient temperature: 98.6
TCO2: 26 mmol/L (ref 0–100)
pCO2 arterial: 35.7 mmHg (ref 35.0–45.0)
pH, Arterial: 7.448 (ref 7.350–7.450)
pO2, Arterial: 211 mmHg — ABNORMAL HIGH (ref 80.0–100.0)

## 2015-02-26 LAB — RAPID URINE DRUG SCREEN, HOSP PERFORMED
AMPHETAMINES: NOT DETECTED
Barbiturates: NOT DETECTED
Benzodiazepines: NOT DETECTED
Cocaine: POSITIVE — AB
OPIATES: NOT DETECTED
Tetrahydrocannabinol: NOT DETECTED

## 2015-02-26 LAB — URINE MICROSCOPIC-ADD ON

## 2015-02-26 LAB — URINALYSIS, ROUTINE W REFLEX MICROSCOPIC
GLUCOSE, UA: NEGATIVE mg/dL
Ketones, ur: 15 mg/dL — AB
Nitrite: NEGATIVE
PH: 6 (ref 5.0–8.0)
Specific Gravity, Urine: 1.012 (ref 1.005–1.030)
Urobilinogen, UA: 1 mg/dL (ref 0.0–1.0)

## 2015-02-26 LAB — PROTIME-INR
INR: 0.97 (ref 0.00–1.49)
INR: 0.98 (ref 0.00–1.49)
Prothrombin Time: 13.1 seconds (ref 11.6–15.2)
Prothrombin Time: 13.2 seconds (ref 11.6–15.2)

## 2015-02-26 LAB — GLUCOSE, CAPILLARY
GLUCOSE-CAPILLARY: 151 mg/dL — AB (ref 65–99)
GLUCOSE-CAPILLARY: 99 mg/dL (ref 65–99)
Glucose-Capillary: 67 mg/dL (ref 65–99)

## 2015-02-26 LAB — CK: CK TOTAL: 139 U/L (ref 49–397)

## 2015-02-26 LAB — APTT
APTT: 28 s (ref 24–37)
aPTT: 28 seconds (ref 24–37)

## 2015-02-26 LAB — TROPONIN I: Troponin I: 0.04 ng/mL — ABNORMAL HIGH (ref <0.031)

## 2015-02-26 LAB — RAPID HIV SCREEN (HIV 1/2 AB+AG)
HIV 1/2 Antibodies: NONREACTIVE
HIV-1 P24 Antigen - HIV24: NONREACTIVE

## 2015-02-26 LAB — I-STAT TROPONIN, ED: Troponin i, poc: 0.09 ng/mL (ref 0.00–0.08)

## 2015-02-26 LAB — CORTISOL: Cortisol, Plasma: 35.3 ug/dL

## 2015-02-26 LAB — LACTIC ACID, PLASMA: LACTIC ACID, VENOUS: 1.1 mmol/L (ref 0.5–2.0)

## 2015-02-26 LAB — ETHANOL

## 2015-02-26 MED ORDER — PROPOFOL 1000 MG/100ML IV EMUL: 5.0000 ug/kg/min | INTRAVENOUS | Status: DC

## 2015-02-26 MED ORDER — NALOXONE HCL 0.4 MG/ML IJ SOLN
0.4000 mg | Freq: Once | INTRAMUSCULAR | Status: AC
  Administered 2015-02-26: 0.4 mg via INTRAVENOUS
  Filled 2015-02-26: qty 1

## 2015-02-26 MED ORDER — SODIUM CHLORIDE 0.9 % IV BOLUS (SEPSIS)
500.0000 mL | Freq: Once | INTRAVENOUS | Status: AC
  Administered 2015-02-26: 500 mL via INTRAVENOUS

## 2015-02-26 MED ORDER — PROPOFOL 10 MG/ML IV BOLUS
INTRAVENOUS | Status: AC
  Administered 2015-02-26: 100 mg
  Filled 2015-02-26: qty 20

## 2015-02-26 MED ORDER — SODIUM CHLORIDE 0.9 % IV SOLN
INTRAVENOUS | Status: DC | PRN
  Administered 2015-02-26 – 2015-03-01 (×2): 1000 mL via INTRAVENOUS
  Administered 2015-03-02: 10 mL via INTRAVENOUS
  Administered 2015-03-04: 250 mL via INTRAVENOUS
  Administered 2015-03-06: 500 mL via INTRAVENOUS
  Administered 2015-03-06: 250 mL via INTRAVENOUS

## 2015-02-26 MED ORDER — PANTOPRAZOLE SODIUM 40 MG IV SOLR
40.0000 mg | Freq: Every day | INTRAVENOUS | Status: DC
  Administered 2015-02-26 – 2015-02-27 (×2): 40 mg via INTRAVENOUS
  Filled 2015-02-26 (×3): qty 40

## 2015-02-26 MED ORDER — PHENYLEPHRINE HCL 10 MG/ML IJ SOLN
0.0000 ug/min | INTRAVENOUS | Status: AC
  Filled 2015-02-26: qty 1

## 2015-02-26 MED ORDER — CHLORHEXIDINE GLUCONATE 0.12 % MT SOLN
15.0000 mL | Freq: Two times a day (BID) | OROMUCOSAL | Status: DC
  Administered 2015-02-26 – 2015-03-16 (×34): 15 mL via OROMUCOSAL
  Filled 2015-02-26 (×34): qty 15

## 2015-02-26 MED ORDER — PRO-STAT SUGAR FREE PO LIQD: 30.0000 mL | Freq: Two times a day (BID) | ORAL | Status: DC

## 2015-02-26 MED ORDER — NICARDIPINE HCL IN NACL 20-0.86 MG/200ML-% IV SOLN
3.0000 mg/h | Freq: Once | INTRAVENOUS | Status: AC
  Administered 2015-02-26: 3 mg/h via INTRAVENOUS
  Filled 2015-02-26: qty 200

## 2015-02-26 MED ORDER — ACETAMINOPHEN 160 MG/5ML PO SOLN
650.0000 mg | Freq: Four times a day (QID) | ORAL | Status: DC | PRN
  Administered 2015-02-28 – 2015-03-03 (×2): 650 mg
  Filled 2015-02-26 (×2): qty 20.3

## 2015-02-26 MED ORDER — POTASSIUM CHLORIDE 10 MEQ/50ML IV SOLN
10.0000 meq | INTRAVENOUS | Status: AC
  Administered 2015-02-26 (×4): 10 meq via INTRAVENOUS
  Filled 2015-02-26 (×4): qty 50

## 2015-02-26 MED ORDER — POTASSIUM CHLORIDE 20 MEQ/15ML (10%) PO SOLN
40.0000 meq | Freq: Three times a day (TID) | ORAL | Status: AC
  Administered 2015-02-26: 40 meq
  Filled 2015-02-26 (×2): qty 30

## 2015-02-26 MED ORDER — PROPOFOL 1000 MG/100ML IV EMUL
INTRAVENOUS | Status: AC
  Filled 2015-02-26: qty 100

## 2015-02-26 MED ORDER — SODIUM CHLORIDE 0.9 % IV SOLN
INTRAVENOUS | Status: DC
  Administered 2015-02-26 – 2015-02-27 (×2): via INTRAVENOUS

## 2015-02-26 MED ORDER — ROCURONIUM BROMIDE 50 MG/5ML IV SOLN
INTRAVENOUS | Status: DC | PRN
  Administered 2015-02-26: 50 mg via INTRAVENOUS

## 2015-02-26 MED ORDER — CETYLPYRIDINIUM CHLORIDE 0.05 % MT LIQD
7.0000 mL | Freq: Four times a day (QID) | OROMUCOSAL | Status: DC
  Administered 2015-02-27 – 2015-03-16 (×64): 7 mL via OROMUCOSAL

## 2015-02-26 MED ORDER — NICARDIPINE HCL IN NACL 20-0.86 MG/200ML-% IV SOLN
3.0000 mg/h | INTRAVENOUS | Status: DC
  Administered 2015-02-26: 5 mg/h via INTRAVENOUS
  Administered 2015-02-27: 4 mg/h via INTRAVENOUS
  Administered 2015-02-27: 5 mg/h via INTRAVENOUS
  Administered 2015-02-27 (×3): 4 mg/h via INTRAVENOUS
  Filled 2015-02-26 (×6): qty 200

## 2015-02-26 MED ORDER — SODIUM CHLORIDE 0.9 % IV SOLN: INTRAVENOUS | Status: DC | PRN

## 2015-02-26 MED ORDER — SODIUM CHLORIDE 0.9 % IV SOLN
25.0000 ug/h | INTRAVENOUS | Status: DC
  Administered 2015-02-26: 100 ug/h via INTRAVENOUS
  Filled 2015-02-26 (×2): qty 50

## 2015-02-26 MED ORDER — PANTOPRAZOLE SODIUM 40 MG IV SOLR
40.0000 mg | INTRAVENOUS | Status: DC
Start: 2015-02-26 — End: 2015-02-26

## 2015-02-26 MED ORDER — VITAL HIGH PROTEIN PO LIQD
1000.0000 mL | ORAL | Status: DC
  Administered 2015-02-26: 19:00:00
  Administered 2015-02-26 – 2015-02-27 (×2): 1000 mL
  Administered 2015-02-27: 06:00:00
  Filled 2015-02-26 (×4): qty 1000

## 2015-02-26 MED ORDER — NITROPRUSSIDE SODIUM 25 MG/ML IV SOLN
0.0000 ug/kg/min | Freq: Once | INTRAVENOUS | Status: AC
  Administered 2015-02-26: 0.3 ug/kg/min via INTRAVENOUS
  Filled 2015-02-26: qty 2

## 2015-02-26 NOTE — Progress Notes (Addendum)
Initial Nutrition Assessment  DOCUMENTATION CODES:  Underweight, Severe malnutrition in context of chronic illness  INTERVENTION:  Day 1 of TF protocol start feed at 25 mL/hr for the remainder of the day.    Day 2 of TF protocol at 0600 start new goal rate of  (50 mL/hr) run from 0600-05:59.    Goal volume:1200 ml daily  Regien will provide 1440 kcal, 90 gr protein and 973 ml water.  NUTRITION DIAGNOSIS:  Malnutrition related to chronic illness, inability to eat as evidenced by estimated needs, NPO status, severe depletion of muscle mass, severe depletion of body fat.  GOAL:  Patient will meet greater than or equal to 90% of their needs   MONITOR:  Vent status, TF tolerance, Weight trends  REASON FOR ASSESSMENT:  Consult Enteral/tube feeding initiation and management  ASSESSMENT: Patient is currently intubated on ventilator support. Hx of malnutrition. OGT in place. Hx of CKD, substance abuse and non-compliance. Found unresponsive. Tube feeding being initiated.   MV: 10.4 L/min Temp (24hrs), Avg:97.2 F (36.2 C), Min:97.2 F (36.2 C), Max:97.2 F (36.2 C)  Propofol: 0 ml/hr    Height:  Ht Readings from Last 1 Encounters:  02/26/15 5\' 8"  (1.727 m)    Weight:  Wt Readings from Last 1 Encounters:  02/26/15 115 lb 1.3 oz (52.2 kg)    Ideal Body Weight:  70 kg  Wt Readings from Last 10 Encounters:  02/26/15 115 lb 1.3 oz (52.2 kg)  02/08/15 115 lb (52.164 kg)  01/29/15 115 lb 11.2 oz (52.481 kg)    BMI:  Body mass index is 17.5 kg/(m^2).  Estimated Nutritional Needs:  Kcal:  1528  Protein: 70-80 gr  Fluid:  1600  Skin:   WDL  Diet Order:  Diet NPO time specified  EDUCATION NEEDS:  No education needs identified at this time   Intake/Output Summary (Last 24 hours) at 02/26/15 1304 Last data filed at 02/26/15 1131  Gross per 24 hour  Intake      0 ml  Output     45 ml  Net    -45 ml    Last BM:  unknown  Royann Shivers  MS,RD,CSG,LDN Office: [phone] Pager: [phone]

## 2015-02-26 NOTE — ED Notes (Signed)
MD at bedside.Neurosurgeon at bedside.

## 2015-02-26 NOTE — ED Notes (Signed)
Report given to Aggie Cosier, RN on 2100 ( ).

## 2015-02-26 NOTE — ED Notes (Signed)
Returned from ct 

## 2015-02-26 NOTE — ED Notes (Signed)
Patient found on floor of home by roommate.  Patient was "catatonic" with EMS en route to ED.  Patient has a history of HTN encephalopathy.  Patient was found with discharge instructions and prescriptions from Jan 29, 2015.  Patient arrives with eyes open, no speaking, making gurgling sounds.  Patient is able to protect airway, smells of urine and incontinent.  Unknown how long patient was on floor.

## 2015-02-26 NOTE — Progress Notes (Signed)
Patient intubated by MD, good color change on ETCO2 detector, good chest rise BBS clear, SATS 100% before and after intubation, will continue to monitor patient.

## 2015-02-26 NOTE — Code Documentation (Signed)
Remains in MRI 

## 2015-02-26 NOTE — Consult Note (Signed)
Reason for Consult: ICH Referring Physician: EDP  Lee Swanson is an 64 y.o. male.   HPI:  64 year old male with a history of intermittent pertinence of crisis, thrombocytopenia, stage IV kidney disease, and cocaine abuse who presents after being found unresponsive for an unknown length of time. He was also an hypertensive crisis and has been started on Nipride. His blood pressure at this time is to 250/110. Head CT showed a small bit of hyperdensity in the basal cisterns on the left and neurosurgical consultation was requested. The patient is awake and not intubated but will not really answer questions other than not her shake his head. At this time he denies headache or double vision. He cannot give me a detailed history.  Past Medical History  Diagnosis Date  . Chronic kidney disease     Stage IV  . Hypokalemia   . Thrombocytopenia   . Hypertension 01/2015.    Hypertensive emergency.  . Hepatitis C antibody test positive 01/25/2015    HIV testing negative on the same date.  . Elevated troponin I level 01/2015.    Felt to be secondary to demand ischemia in setting of hypertension and cocaine use. Cardiac cath not planned given kidney disease  . Vitamin D deficiency 01/2015.  . Barrett's esophagus 02/05/2015    EGD 01/2015 - anticipate repeat EGD 2019    Past Surgical History  Procedure Laterality Date  . Esophagogastroduodenoscopy N/A 01/27/2015    Procedure: ESOPHAGOGASTRODUODENOSCOPY (EGD);  Surgeon: Iva Boop, MD;  Location: [facility] ENDOSCOPY;  Service: Endoscopy;  Laterality: N/A;    No Known Allergies  History  Substance Use Topics  . Smoking status: Current Every Day Smoker -- 0.50 packs/day    Types: Cigarettes  . Smokeless tobacco: Never Used  . Alcohol Use: Yes    History reviewed. No pertinent family history.   Review of Systems  Positive ROS: Unable to obtain  All other systems have been reviewed and were otherwise negative with the exception of those mentioned in  the HPI and as above.  Objective: Vital signs in last 24 hours: Temp:  [97.2 F (36.2 C)] 97.2 F (36.2 C) (06/11 0626) Pulse Rate:  [81-94] 93 (06/11 0800) Resp:  [12-15] 15 (06/11 0800) BP: (261-268)/(142-162) 261/142 mmHg (06/11 0800) SpO2:  [96 %-100 %] 100 % (06/11 0800) Weight:  [115 lb 1.3 oz (52.2 kg)] 115 lb 1.3 oz (52.2 kg) (06/11 0644)  General Appearance: Alert, no distress, appears stated age Head: Normocephalic, without obvious abnormality, atraumatic Eyes: PERRL, conjunctiva/corneas clear, EOM's intact      Neck: Supple, symmetrical, trachea midline Lungs:  respirations unlabored Extremities: Extremities normal, atraumatic, no cyanosis or edema  NEUROLOGIC:   Mental status: awake and alert but unable to test memory and fund of knowledge or speechxam - grossly normal, normal tone and bulk Sensory Exam: unable to accurately test Reflexes : symmetric, no pathologic reflexes, No Hoffman's, No clonus Coordination -  unable to accurately test Gait - Not tested Balance -  not tested Cranial Nerves: I: smell Not tested  II: visual acuity  OS: na    OD: na  II: visual fields Full to confrontation  II: pupils Equal, round, reactive to light  III,VII: ptosis None  III,IV,VI: extraocular muscles  Full ROM  V: mastication Normal  V: facial light touch sensation  Normal  V,VII: corneal reflex  Present  VII: facial muscle function - upper  Normal  VII: facial muscle function - lower Normal  VIII: hearing  Not tested  IX: soft palate elevation  Normal  IX,X: gag reflex Present  XI: trapezius strength  5/5  XI: sternocleidomastoid strength 5/5  XI: neck flexion strength  5/5  XII: tongue strength  Normal    Data Review Lab Results  Component Value Date   WBC 6.7 02/26/2015   HGB 15.6 02/26/2015   HCT 46.0 02/26/2015   MCV 84.2 02/26/2015   PLT 126* 02/26/2015   Lab Results  Component Value Date   NA 136 02/26/2015   K 3.3* 02/26/2015   CL 95* 02/26/2015    CO2 29 02/26/2015   BUN 63* 02/26/2015   CREATININE 4.60* 02/26/2015   GLUCOSE 113* 02/26/2015   Lab Results  Component Value Date   INR 0.97 02/26/2015    Radiology: Ct Head Wo Contrast  02/26/2015   CLINICAL DATA:  Found unresponsive, initial encounter  EXAM: CT HEAD WITHOUT CONTRAST  TECHNIQUE: Contiguous axial images were obtained from the base of the skull through the vertex without intravenous contrast.  COMPARISON:  02/08/2015  FINDINGS: Ventricles are mildly prominent but stable in appearance. The bony calvarium is intact. A areas of chronic white matter ischemic change are noted. There is a linear area of increased attenuation along the lateral aspect of the midbrain on the left which was not present on the prior exam and may represent a small subdural hematoma.  IMPRESSION: Changes suggestive of a small subdural hematoma along the left lateral midbrain. This was not present on the prior exam. Short-term followup is recommended to assess for stability/resolution.  Chronic ischemic changes as described.   Electronically Signed   By: Alcide Clever M.D.   On: 02/26/2015 06:59     Assessment/Plan:64 year old gentleman with multiple medical problems who has hyperdensity in the left basal cisterns consistent with either traumatic subarachnoid hemorrhage, aneurysmal subarachnoid hemorrhage, traumatic subdural hemorrhage. I would favor subarachnoid hemorrhage at this time. I would recommend CTA or MRA to rule out aneurysm except for the fact that his creatinine is 4.6. For now I think the best course of management is get his blood pressure under tight control with a goal of systolic pressure less than 180 (preferably less than 160 if possible ). Obviously, if he had an angiogram that suggested aneurysm than he would need the aneurysm treated. Otherwise this requires no acute neurosurgical intervention. If it is traumatic subarachnoid blood or subdural blood it will simply go away with time most  likely. It is difficult to know the cause of the hemorrhage. Again it could be aneurysmal but it could just as easily be related to trauma or cocaine use or hypertension. UDS is pending.   Aleksandar Duve S 02/26/2015 8:27 AM

## 2015-02-26 NOTE — Code Documentation (Signed)
Friend -- raynard fate -- called to check on pt-- updated. Friend does not know of any family or how to get in touch with any family.

## 2015-02-26 NOTE — Progress Notes (Signed)
eLink Physician-Brief Progress Note Patient Name: [name] DOB: 08/29/1951 MRN: [id number]   Date of Service  02/26/2015  HPI/Events of Note  Hypertension. BP = 236/128.  eICU Interventions  Restart Narcadipine IV infusion. Titrate to SBP = 160.     Intervention Category Major Interventions: Hypertension - evaluation and management  Eulala Newcombe Eugene 02/26/2015, 5:07 PM

## 2015-02-26 NOTE — H&P (Signed)
PULMONARY / CRITICAL CARE MEDICINE   Name: Lee Swanson MRN: [id number] DOB: Sep 04, 1951    ADMISSION DATE:  02/26/2015   REFERRING MD :  EDP  CHIEF COMPLAINT: AMS  INITIAL PRESENTATION: AMS/ BP 250/150  STUDIES:  MRI Head>>  SIGNIFICANT EVENTS: 6/11 admitted with htn crisis and sah   HISTORY OF PRESENT ILLNESS:   37  Male with hx of substance abuse, medical non compliance, HTN. CRD, suicidal  Ideation who was found down for unknown time, breathing, extremely hypertensive 250/155 and was transported to [facility] ED. CT scan with ? Bleed, started on nipride, intubated, a line placed. NS consulted and MRI ordered. He was cocaine + on admit. PCCM asked to manage.  PAST MEDICAL HISTORY :   has a past medical history of Chronic kidney disease; Hypokalemia; Thrombocytopenia; Hypertension (01/2015.); Hepatitis C antibody test positive (01/25/2015); Elevated troponin I level (01/2015.); Vitamin D deficiency (01/2015.); and Barrett's esophagus (02/05/2015).  has past surgical history that includes Esophagogastroduodenoscopy (N/A, 01/27/2015). Prior to Admission medications   Medication Sig Start Date End Date Taking? Authorizing Provider  aspirin EC 81 MG EC tablet Take 1 tablet (81 mg total) by mouth daily. 01/29/15   Su Hoff, MD  carvedilol (COREG) 6.25 MG tablet Take 1 tablet (6.25 mg total) by mouth 2 (two) times daily with a meal. 01/29/15   Carly J Rivet, MD  pantoprazole (PROTONIX) 40 MG tablet Take 1 tablet (40 mg total) by mouth 2 (two) times daily. 01/29/15   Su Hoff, MD  sevelamer carbonate (RENVELA) 800 MG tablet Take 1 tablet (800 mg total) by mouth 3 (three) times daily with meals. 01/29/15   Carly Arlyce Harman, MD  verapamil (CALAN) 120 MG tablet Take 1 tablet (120 mg total) by mouth every 8 (eight) hours. 01/29/15   Su Hoff, MD  Vitamin D, Ergocalciferol, (DRISDOL) 50000 UNITS CAPS capsule Take 1 capsule (50,000 Units total) by mouth every 7 (seven) days. TAKE ONE PILL EVERY  [facility] 01/29/15   Su Hoff, MD   No Known Allergies  FAMILY HISTORY:  has no family status information on file.  SOCIAL HISTORY:  reports that he has been smoking Cigarettes.  He has been smoking about 0.50 packs per day. He has never used smokeless tobacco. He reports that he drinks alcohol. He reports that he uses illicit drugs (Cocaine).  REVIEW OF SYSTEMS:  NA  SUBJECTIVE:   VITAL SIGNS: Temp:  [97.2 F (36.2 C)] 97.2 F (36.2 C) (06/11 0626) Pulse Rate:  [81-127] 117 (06/11 1010) Resp:  [12-26] 20 (06/11 0945) BP: (110-268)/(78-162) 110/78 mmHg (06/11 1010) SpO2:  [96 %-100 %] 100 % (06/11 0945) Arterial Line BP: (151-267)/(85-158) 151/85 mmHg (06/11 0945) FiO2 (%):  [40 %] 40 % (06/11 0844) Weight:  [115 lb 1.3 oz (52.2 kg)] 115 lb 1.3 oz (52.2 kg) (06/11 0644) HEMODYNAMICS:   VENTILATOR SETTINGS: Vent Mode:  [-] PRVC FiO2 (%):  [40 %] 40 % Set Rate:  [20 bmp] 20 bmp Vt Set:  [550 mL] 550 mL PEEP:  [5 cmH20] 5 cmH20 Plateau Pressure:  [13 cmH20] 13 cmH20 INTAKE / OUTPUT: No intake or output data in the 24 hours ending 02/26/15 1025  PHYSICAL EXAMINATION: General: Sedated on v ent Neuro:  Sedated on vent HEENT:  OTT-> vent Cardiovascular:  HSR RRR Lungs:  decreased in bases Abdomen: soft +bs Musculoskeletal: intact Skin:  Warm and dry  LABS:  CBC  Recent Labs Lab 02/26/15 0731 02/26/15 0756  WBC  6.7  --   HGB 13.7 15.6  HCT 40.5 46.0  PLT 126*  --    Coag's  Recent Labs Lab 02/26/15 0731  APTT 28  INR 0.97   BMET  Recent Labs Lab 02/26/15 0731 02/26/15 0756  NA 135 136  K 3.7 3.3*  CL 91* 95*  CO2 29  --   BUN 65* 63*  CREATININE 4.71* 4.60*  GLUCOSE 109* 113*   Electrolytes  Recent Labs Lab 02/26/15 0731  CALCIUM 8.9   Sepsis Markers No results for input(s): LATICACIDVEN, PROCALCITON, O2SATVEN in the last 168 hours. ABG No results for input(s): PHART, PCO2ART, PO2ART in the last 168 hours. Liver Enzymes  Recent  Labs Lab 02/26/15 0731  AST 45*  ALT 29  ALKPHOS 71  BILITOT 1.8*  ALBUMIN 3.8   Cardiac Enzymes  Recent Labs Lab 02/26/15 0731  TROPONINI 0.04*   Glucose No results for input(s): GLUCAP in the last 168 hours.  Imaging Ct Head Wo Contrast  02/26/2015   CLINICAL DATA:  Found unresponsive, initial encounter  EXAM: CT HEAD WITHOUT CONTRAST  TECHNIQUE: Contiguous axial images were obtained from the base of the skull through the vertex without intravenous contrast.  COMPARISON:  02/08/2015  FINDINGS: Ventricles are mildly prominent but stable in appearance. The bony calvarium is intact. A areas of chronic white matter ischemic change are noted. There is a linear area of increased attenuation along the lateral aspect of the midbrain on the left which was not present on the prior exam and may represent a small subdural hematoma.  IMPRESSION: Changes suggestive of a small subdural hematoma along the left lateral midbrain. This was not present on the prior exam. Short-term followup is recommended to assess for stability/resolution.  Chronic ischemic changes as described.   Electronically Signed   By: Alcide Clever M.D.   On: 02/26/2015 06:59   Dg Chest Port 1 View  02/26/2015   CLINICAL DATA:  Central line and endotracheal tube placements.  EXAM: PORTABLE CHEST - 1 VIEW  COMPARISON:  01/26/2015  FINDINGS: Endotracheal tube has been placed and terminates approximately 4 cm above the carina. Right subclavian central venous catheter has been placed with tip overlying the lower SVC. Cardiomediastinal silhouette is unchanged and within normal limits. The lungs are well inflated and clear. No pleural effusion or pneumothorax is identified. No acute osseous abnormality is seen.  IMPRESSION: 1. Support devices as above. 2. Clear lungs.   Electronically Signed   By: Sebastian Ache   On: 02/26/2015 09:07     ASSESSMENT / PLAN:  PULMONARY OETT 6/11>> A: Intubated to go to mri P:   Vent bundle BD's Wean    CARDIOVASCULAR CVL 6/11 rt [place] >> A: HTN crisis with cocaine abuse P:  Cardene drip Drug counseling in future  RENAL A:   CKD base creatine 4.5 P:   May need renal consult  GASTROINTESTINAL A:   Hx barret's P:   PPI  HEMATOLOGIC A:   Hx of thrombcytopenia P: Follow plts   INFECTIOUS A:   No known infectious process P:    ENDOCRINE A:   No acute issue P:     NEUROLOGIC A:   AMS SDH presumed secondary to htn crisis P:   RASS goal: -1 Sedated for vent management MRI head pending NS following  FAMILY  - Updates:   - Inter-disciplinary family meet or Palliative Care meeting due by:  day 7  TODAY'S SUMMARY:  32  Male with hx  of substance abuse, medical non compliance, HTN. CRD, suicidal  Ideation who was found down for unknown time, breathing, extremely hypertensive 250/155 and was transported to [facility] ED. CT scan with ? Bleed, started on nipride, intubated, a line placed. NS consulted and MRI ordered. He was cocaine + on admit. PCCM asked to manage.  Brett Canales Minor ACNP Adolph Pollack PCCM Pager [phone] till 3 pm If no answer page [phone] 02/26/2015, 65:32 AM  64 year old cocaine addict who is cocaine positive, presenting with hypertensive emergency and a SDH with ?of ICH.  Patient lost his level of consciousness and was intubated by EDP.  Patient has renal disease so unable to do MRI done and pending.  Will admit to the ICU.  Place on cardene drip for BP control.  Avoid beta blockers.  ABG ordered and pending.  Will adjust vent.  Neurosurgery to see on consultation.  The patient is critically ill with multiple organ systems failure and requires high complexity decision making for assessment and support, frequent evaluation and titration of therapies, application of advanced monitoring technologies and extensive interpretation of multiple databases.   Critical Care Time devoted to patient care services described in this note is  35  Minutes. This time reflects  time of care of this signee Dr Koren Bound. This critical care time does not reflect procedure time, or teaching time or supervisory time of PA/NP/Med student/Med Resident etc but could involve care discussion time.  Alyson Reedy, M.D. [facility] Pulmonary/Critical Care Medicine. Pager: [phone]. After hours pager: [phone].

## 2015-02-26 NOTE — ED Notes (Signed)
Dr Rhunette Croft and Dahlia Client PA at bedside.

## 2015-02-26 NOTE — Progress Notes (Signed)
eLink Physician-Brief Progress Note Patient Name: Lee Swanson DOB: 07/11/1951 MRN: [id number]   Date of Service  02/26/2015  HPI/Events of Note  Oliguria. Bladder scan negative. CVP = 7.  eICU Interventions  Will bolus with 0.9 NaCl 200 mL IV over 30 minutes now.     Intervention Category Intermediate Interventions: Oliguria - evaluation and management  Sommer,Steven Eugene 02/26/2015, 10:10 PM

## 2015-02-26 NOTE — ED Provider Notes (Signed)
CSN: [id number]     Arrival date & time 02/26/15  4098 History   First MD Initiated Contact with Patient 02/26/15 0631     Chief Complaint  Patient presents with  . Altered Mental Status     (Consider location/radiation/quality/duration/timing/severity/associated sxs/prior Treatment) Patient is a 64 y.o. male presenting with altered mental status. The history is provided by the EMS personnel and medical records. No language interpreter was used.  Altered Mental Status    Lee Swanson is a 64 y.o. male  with a hx of CKD, cocaine abuse, HTN, hypokalemia, Barrett's esophagus presents to the Emergency Department via EMS after being found down for an unknown length of time. Pt is severely hypertensive on arrival.  EMS unable to obtain and IV enroute.  Level 5 caveat for AMS.  Record review shows hx of cocaine abuse and hypertensive emergency with elevated troponin levels.  Pt has not had a cardiac cath as this was felt to be 2/2 ischemia related to cocaine and hypertension.    Past Medical History  Diagnosis Date  . Chronic kidney disease     Stage IV  . Hypokalemia   . Thrombocytopenia   . Hypertension 01/2015.    Hypertensive emergency.  . Hepatitis C antibody test positive 01/25/2015    HIV testing negative on the same date.  . Elevated troponin I level 01/2015.    Felt to be secondary to demand ischemia in setting of hypertension and cocaine use. Cardiac cath not planned given kidney disease  . Vitamin D deficiency 01/2015.  . Barrett's esophagus 02/05/2015    EGD 01/2015 - anticipate repeat EGD 2019   Past Surgical History  Procedure Laterality Date  . Esophagogastroduodenoscopy N/A 01/27/2015    Procedure: ESOPHAGOGASTRODUODENOSCOPY (EGD);  Surgeon: Iva Boop, MD;  Location: [facility] END[facility];  Service: Endoscopy;  Laterality: N/A;   History reviewed. No pertinent family history. History  Substance Use Topics  . Smoking status: Current Every Day Smoker -- 0.50 packs/day     Types: Cigarettes  . Smokeless tobacco: Never Used  . Alcohol Use: Yes    Review of Systems  Unable to perform ROS: Mental status change      Allergies  Review of patient's allergies indicates no known allergies.  Home Medications   Prior to Admission medications   Medication Sig Start Date End Date Taking? Authorizing Provider  aspirin EC 81 MG EC tablet Take 1 tablet (81 mg total) by mouth daily. 01/29/15   Su Hoff, MD  carvedilol (COREG) 6.25 MG tablet Take 1 tablet (6.25 mg total) by mouth 2 (two) times daily with a meal. 01/29/15   Carly J Rivet, MD  pantoprazole (PROTONIX) 40 MG tablet Take 1 tablet (40 mg total) by mouth 2 (two) times daily. 01/29/15   Su Hoff, MD  sevelamer carbonate (RENVELA) 800 MG tablet Take 1 tablet (800 mg total) by mouth 3 (three) times daily with meals. 01/29/15   Carly Arlyce Harman, MD  verapamil (CALAN) 120 MG tablet Take 1 tablet (120 mg total) by mouth every 8 (eight) hours. 01/29/15   Su Hoff, MD  Vitamin D, Ergocalciferol, (DRISDOL) 50000 UNITS CAPS capsule Take 1 capsule (50,000 Units total) by mouth every 7 (seven) days. TAKE ONE PILL EVERY WEDNESDAY 01/29/15   Su Hoff, MD   BP 110/78 mmHg  Pulse 99  Temp(Src) 97.2 F (36.2 C) (Oral)  Resp 20  Ht 5\' 8"  (1.727 m)  Wt 115 lb 1.3 oz (52.2  kg)  BMI 17.50 kg/m2  SpO2 100% Physical Exam  Constitutional: No distress.  cachetic   HENT:  Head: Normocephalic.  Eyes: Conjunctivae are normal. No scleral icterus.  Pupils fixed; L > R  Neck: Neck supple. No tracheal deviation present. No thyromegaly present.  Cardiovascular: Normal rate and normal heart sounds.   Pulmonary/Chest: Effort normal and breath sounds normal.  Abdominal: Soft. Bowel sounds are normal. He exhibits no distension.  Musculoskeletal: He exhibits no edema.  Neurological:  Pt moans to painful stimuli  Skin: No rash noted. He is not diaphoretic.  Skin is cool and dry    ED Course  INTUBATION Date/Time:  02/26/2015 8:34 AM Performed by: Dierdre Forth Authorized by: Dierdre Forth Consent: The procedure was performed in an emergent situation. Test results: test results available and properly labeled Imaging studies: imaging studies available Required items: required blood products, implants, devices, and special equipment available Patient identity confirmed: arm band Time out: Immediately prior to procedure a "time out" was called to verify the correct patient, procedure, equipment, support staff and site/side marked as required. Indications: airway protection Intubation method: fiberoptic oral Patient status: paralyzed (RSI) Preoxygenation: BVM Sedatives: propofol Paralytic: rocuronium Laryngoscope size: Mac 4 Tube size: 7.5 mm Tube type: cuffed Number of attempts: 1 Ventilation between attempts: BVM Cricoid pressure: no Cords visualized: yes Post-procedure assessment: chest rise,  CO2 detector and esophageal detector Breath sounds: equal Cuff inflated: yes ETT to lip: 24 cm ETT to teeth: 23 cm Tube secured with: ETT holder Chest x-ray interpreted by me and radiologist. Chest x-ray findings: endotracheal tube in appropriate position Patient tolerance: Patient tolerated the procedure well with no immediate complications   (including critical care time) Labs Review Labs Reviewed  CBC WITH DIFFERENTIAL/PLATELET - Abnormal; Notable for the following:    Platelets 126 (*)    Neutrophils Relative % 89 (*)    Lymphocytes Relative 7 (*)    Lymphs Abs 0.4 (*)    All other components within normal limits  COMPREHENSIVE METABOLIC PANEL - Abnormal; Notable for the following:    Chloride 91 (*)    Glucose, Bld 109 (*)    BUN 65 (*)    Creatinine, Ser 4.71 (*)    AST 45 (*)    Total Bilirubin 1.8 (*)    GFR calc non Af Amer 12 (*)    GFR calc Af Amer 14 (*)    All other components within normal limits  TROPONIN I - Abnormal; Notable for the following:    Troponin I  0.04 (*)    All other components within normal limits  URINALYSIS, ROUTINE W REFLEX MICROSCOPIC (NOT AT [facility]) - Abnormal; Notable for the following:    Color, Urine RED (*)    APPearance CLOUDY (*)    Hgb urine dipstick LARGE (*)    Bilirubin Urine MODERATE (*)    Ketones, ur 15 (*)    Protein, ur >300 (*)    Leukocytes, UA SMALL (*)    All other components within normal limits  URINE RAPID DRUG SCREEN, HOSP PERFORMED - Abnormal; Notable for the following:    Cocaine POSITIVE (*)    All other components within normal limits  COMPREHENSIVE METABOLIC PANEL - Abnormal; Notable for the following:    Potassium 2.7 (*)    Chloride 97 (*)    Glucose, Bld 114 (*)    BUN 66 (*)    Creatinine, Ser 4.41 (*)    Calcium 8.3 (*)    Total Protein  6.2 (*)    Total Bilirubin 1.9 (*)    GFR calc non Af Amer 13 (*)    GFR calc Af Amer 15 (*)    All other components within normal limits  CBC WITH DIFFERENTIAL/PLATELET - Abnormal; Notable for the following:    RBC 3.89 (*)    Hemoglobin 11.2 (*)    HCT 32.2 (*)    Platelets 126 (*)    Neutrophils Relative % 91 (*)    Lymphocytes Relative 5 (*)    Lymphs Abs 0.3 (*)    All other components within normal limits  COMPREHENSIVE METABOLIC PANEL - Abnormal; Notable for the following:    Potassium 3.2 (*)    Chloride 97 (*)    Glucose, Bld 111 (*)    BUN 71 (*)    Creatinine, Ser 4.75 (*)    Calcium 8.4 (*)    Total Protein 5.8 (*)    Albumin 3.2 (*)    Total Bilirubin 1.6 (*)    GFR calc non Af Amer 12 (*)    GFR calc Af Amer 14 (*)    Anion gap 17 (*)    All other components within normal limits  GLUCOSE, CAPILLARY - Abnormal; Notable for the following:    Glucose-Capillary 151 (*)    All other components within normal limits  I-STAT TROPOININ, ED - Abnormal; Notable for the following:    Troponin i, poc 0.09 (*)    All other components within normal limits  I-STAT CHEM 8, ED - Abnormal; Notable for the following:    Potassium 3.3 (*)     Chloride 95 (*)    BUN 63 (*)    Creatinine, Ser 4.60 (*)    Glucose, Bld 113 (*)    Calcium, Ion 1.00 (*)    All other components within normal limits  I-STAT ARTERIAL BLOOD GAS, ED - Abnormal; Notable for the following:    pO2, Arterial 211.0 (*)    Bicarbonate 24.7 (*)    All other components within normal limits  APTT  PROTIME-INR  ETHANOL  CK  URINE MICROSCOPIC-ADD ON  RAPID HIV SCREEN (HIV 1/2 AB+AG)  APTT  PROTIME-INR  LACTIC ACID, PLASMA  CORTISOL  RPR  BLOOD GAS, ARTERIAL  DRUGS OF ABUSE SCREEN W ALC, ROUTINE URINE    Imaging Review Ct Head Wo Contrast  02/26/2015   CLINICAL DATA:  Found unresponsive, initial encounter  EXAM: CT HEAD WITHOUT CONTRAST  TECHNIQUE: Contiguous axial images were obtained from the base of the skull through the vertex without intravenous contrast.  COMPARISON:  02/08/2015  FINDINGS: Ventricles are mildly prominent but stable in appearance. The bony calvarium is intact. A areas of chronic white matter ischemic change are noted. There is a linear area of increased attenuation along the lateral aspect of the midbrain on the left which was not present on the prior exam and may represent a small subdural hematoma.  IMPRESSION: Changes suggestive of a small subdural hematoma along the left lateral midbrain. This was not present on the prior exam. Short-term followup is recommended to assess for stability/resolution.  Chronic ischemic changes as described.   Electronically Signed   By: Alcide Clever M.D.   On: 02/26/2015 06:59   Mr Angiogram Head Wo Contrast  02/26/2015   CLINICAL DATA:  Intracranial hemorrhage suggested on CT scan yesterday. Found unresponsive.  EXAM: MRI HEAD WITHOUT CONTRAST  MRA HEAD WITHOUT CONTRAST  TECHNIQUE: Multiplanar, multiecho pulse sequences of the brain and surrounding structures were  obtained without intravenous contrast. Angiographic images of the head were obtained using MRA technique without contrast.  COMPARISON:  CT  02/26/2015 and 02/08/2015.  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 There are micro hemorrhages present associated with many of the old small vessel infarctions seen scattered within the cerebellum and the cerebral hemispheric white matter. There is a small amount of hemorrhage in the perimesencephalic cistern on the left consistent with venous subarachnoid bleeding. There may be a tiny amount of blood dependent in the occipital horn of the left lateral ventricle.  There is profound edema throughout the brainstem also with involvement of the cerebellum and the posterior aspects of the cerebral hemispheres. 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 Differential diagnosis includes toxic demyelination. No evidence of obstructive hydrocephalus. No sign of neoplastic mass lesion. No extra-axial collection. No pituitary mass. No inflammatory sinus disease.  MRA HEAD FINDINGS  Both internal carotid arteries are widely patent through the siphon region. The anterior and middle cerebral vessels are patent without proximal stenosis, aneurysm or vascular malformation. There is artifactual signal loss.  Both vertebral arteries are patent with the right vertebral artery being dominant and supplying the basilar. The left vertebral artery is a small vessel that terminates in PICA. No basilar stenosis. Major posterior circulation branch vessels are patent. Fetal origin of the right PCA.  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 The differential diagnosis does include  toxic demyelination, but that is felt less likely.  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 Acute left perimesencephalic subarachnoid hemorrhage, which is usually the result of venous bleeding rather than aneurysmal bleeding.  Negative intracranial MR angiography of the large and medium size vessels.   Electronically Signed   By: Paulina Fusi M.D.   On: 02/26/2015 11:16   Mr Brain Wo Contrast  02/26/2015   CLINICAL DATA:  Intracranial hemorrhage suggested on CT scan yesterday. Found unresponsive.  EXAM: MRI HEAD WITHOUT CONTRAST  MRA HEAD WITHOUT CONTRAST  TECHNIQUE: Multiplanar, multiecho pulse sequences of the brain and surrounding structures were obtained without intravenous contrast. Angiographic images of the head were obtained using MRA technique without contrast.  COMPARISON:  CT 02/26/2015 and 02/08/2015.  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 There are micro hemorrhages present associated with many of the old small vessel infarctions seen scattered within the cerebellum and the cerebral hemispheric white matter. There is a small amount of hemorrhage in the perimesencephalic cistern on the left consistent with venous subarachnoid bleeding. There may be a tiny amount of blood dependent in the occipital horn of the left lateral ventricle.  There is profound edema throughout the brainstem also with involvement of the cerebellum and the posterior aspects of the cerebral hemispheres. 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 Differential diagnosis includes toxic demyelination. No evidence of obstructive hydrocephalus. No sign of neoplastic mass lesion. No extra-axial collection. No pituitary  mass. No  inflammatory sinus disease.  MRA HEAD FINDINGS  Both internal carotid arteries are widely patent through the siphon region. The anterior and middle cerebral vessels are patent without proximal stenosis, aneurysm or vascular malformation. There is artifactual signal loss.  Both vertebral arteries are patent with the right vertebral artery being dominant and supplying the basilar. The left vertebral artery is a small vessel that terminates in PICA. No basilar stenosis. Major posterior circulation branch vessels are patent. Fetal origin of the right PCA.  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 The differential diagnosis does include toxic demyelination, but that is felt less likely.  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 Acute left perimesencephalic subarachnoid hemorrhage, which is usually the result of venous bleeding rather than aneurysmal bleeding.  Negative intracranial MR angiography of the large and medium size vessels.   Electronically Signed   By: Paulina Fusi M.D.   On: 02/26/2015 11:16   Dg Chest Port 1 View  02/26/2015   CLINICAL DATA:  Central line and endotracheal tube placements.  EXAM: PORTABLE CHEST - 1 VIEW  COMPARISON:  01/26/2015  FINDINGS: Endotracheal tube has been placed and terminates approximately 4 cm above the carina. Right subclavian central venous catheter has been placed with tip overlying the lower SVC. Cardiomediastinal silhouette is unchanged and within normal limits. The lungs are well inflated and clear. No pleural effusion or pneumothorax is identified. No acute osseous abnormality is seen.  IMPRESSION: 1. Support devices as above. 2. Clear lungs.   Electronically Signed   By: Sebastian Ache   On: 02/26/2015 09:07      EKG Interpretation None      CRITICAL CARE Performed by: Dierdre Forth Total critical care time: Critical care time was exclusive of separately billable procedures and treating other patients. Critical care was necessary to treat or prevent imminent or life-threatening deterioration. Critical care was time spent personally by me on the following activities: development of treatment plan with patient and/or surrogate as well as nursing, discussions with consultants, evaluation of patient's response to treatment, examination of patient, obtaining history from patient or surrogate, ordering and performing treatments and interventions, ordering and review of laboratory studies, ordering and review of radiographic studies, pulse oximetry and re-evaluation of patient's condition.   MDM   Final diagnoses:  Altered mental status  Subdural hematoma  Hypertensive urgency  Endotracheally intubated   Armandina Stammer presents with hypertensive emergency with  BP of 268/162 and AMS.  Pt evaluated by Dr. Rhunette Croft and myself.  Pt with hx of same per records.  Pt is unable to answer questions.  CT to r/o CVA and aggressive BP management planned with drip.    7:28 AM Pt discussed with Dr. Yetta Barre of neurosurgery who recommends CTA and will consult.  No surgical management recommended at this time.  Also discussed with Dr. Mosetta Putt of Radiology who authorized CTA with cental line.    7:48 AM Art line placed by Dr. Rhunette Croft.  MAP 199 on Nitroprusside drip.  Will increase titration with goal of MAP 150.  Consult to critical care.    7:58 AM Discussed with PCCM, Dr. Shelle Iron who recommends Nicardapine drip in lieu of Nitroprusside drip.  Goal to SBP 150.    8:17 AM Discussed with Dr. Yetta Barre again; plan for operative management if [facility].  Pt  unable to have CTA.  Will intubate and send to MRI and reconsult.    10:09 AM Pt in MRI.  Discussed with PCCM who will evaluate in the ED.  Improving  SBP and MAP.   11:17AM Pt admitted to PCCM  MRI with profound edema throughout the brainstem, also with involvement of the cerebellum and posterior aspects of the cerebral hemispheres.  Acute left perimesencephalic subarachnoid hemorrhage.    Dahlia Client Arrayah Connors, PA-C 02/26/15 1603  Derwood Kaplan, MD 02/26/15 2317

## 2015-02-26 NOTE — Code Documentation (Signed)
To MRI with resp therapy. Pt bp stable

## 2015-02-26 NOTE — Progress Notes (Signed)
Patient intubated unable to obtain information from patient. According to EDP note patient has a hx of substance abuse, medical non compliance, HTN. CRD, suicidal Ideation who was found down for unknown time, breathing, extremely hypertensive 250/155. Patient is being admitted to Medical ICU. CM/SW will follow for discharge plan.

## 2015-02-27 ENCOUNTER — Inpatient Hospital Stay (HOSPITAL_COMMUNITY): Payer: Medicaid Other

## 2015-02-27 DIAGNOSIS — I10 Essential (primary) hypertension: Secondary | ICD-10-CM

## 2015-02-27 LAB — BASIC METABOLIC PANEL
Anion gap: 14 (ref 5–15)
BUN: 74 mg/dL — ABNORMAL HIGH (ref 6–20)
CO2: 21 mmol/L — AB (ref 22–32)
CREATININE: 5.15 mg/dL — AB (ref 0.61–1.24)
Calcium: 8 mg/dL — ABNORMAL LOW (ref 8.9–10.3)
Chloride: 103 mmol/L (ref 101–111)
GFR calc Af Amer: 12 mL/min — ABNORMAL LOW (ref >60)
GFR calc non Af Amer: 11 mL/min — ABNORMAL LOW (ref >60)
Glucose, Bld: 183 mg/dL — ABNORMAL HIGH (ref 65–99)
POTASSIUM: 4.2 mmol/L (ref 3.5–5.1)
SODIUM: 138 mmol/L (ref 135–145)

## 2015-02-27 LAB — BLOOD GAS, ARTERIAL
Acid-base deficit: 3.6 mmol/L — ABNORMAL HIGH (ref 0.0–2.0)
Bicarbonate: 20.3 mEq/L (ref 20.0–24.0)
Drawn by: 40415
FIO2: 40 %
LHR: 20 {breaths}/min
O2 Saturation: 99.1 %
PEEP: 5 cmH2O
PH ART: 7.408 (ref 7.350–7.450)
Patient temperature: 98.6
TCO2: 21.3 mmol/L (ref 0–100)
VT: 550 mL
pCO2 arterial: 32.8 mmHg — ABNORMAL LOW (ref 35.0–45.0)
pO2, Arterial: 111 mmHg — ABNORMAL HIGH (ref 80.0–100.0)

## 2015-02-27 LAB — CBC
HCT: 29.2 % — ABNORMAL LOW (ref 39.0–52.0)
Hemoglobin: 9.9 g/dL — ABNORMAL LOW (ref 13.0–17.0)
MCH: 28.7 pg (ref 26.0–34.0)
MCHC: 33.9 g/dL (ref 30.0–36.0)
MCV: 84.6 fL (ref 78.0–100.0)
PLATELETS: 136 10*3/uL — AB (ref 150–400)
RBC: 3.45 MIL/uL — ABNORMAL LOW (ref 4.22–5.81)
RDW: 14.1 % (ref 11.5–15.5)
WBC: 19.6 10*3/uL — AB (ref 4.0–10.5)

## 2015-02-27 LAB — GLUCOSE, CAPILLARY
GLUCOSE-CAPILLARY: 145 mg/dL — AB (ref 65–99)
GLUCOSE-CAPILLARY: 145 mg/dL — AB (ref 65–99)
Glucose-Capillary: 123 mg/dL — ABNORMAL HIGH (ref 65–99)
Glucose-Capillary: 148 mg/dL — ABNORMAL HIGH (ref 65–99)
Glucose-Capillary: 164 mg/dL — ABNORMAL HIGH (ref 65–99)
Glucose-Capillary: 200 mg/dL — ABNORMAL HIGH (ref 65–99)

## 2015-02-27 LAB — PHOSPHORUS: PHOSPHORUS: 5 mg/dL — AB (ref 2.5–4.6)

## 2015-02-27 LAB — MAGNESIUM: MAGNESIUM: 2.3 mg/dL (ref 1.7–2.4)

## 2015-02-27 LAB — RPR: RPR: NONREACTIVE

## 2015-02-27 MED ORDER — SODIUM CHLORIDE 0.9 % IV BOLUS (SEPSIS)
750.0000 mL | Freq: Once | INTRAVENOUS | Status: AC
  Administered 2015-02-27: 750 mL via INTRAVENOUS

## 2015-02-27 MED ORDER — NICARDIPINE HCL IN NACL 40-0.83 MG/200ML-% IV SOLN
3.0000 mg/h | INTRAVENOUS | Status: DC
  Administered 2015-02-28: 5.5 mg/h via INTRAVENOUS
  Administered 2015-02-28: 5 mg/h via INTRAVENOUS
  Administered 2015-02-28 – 2015-03-01 (×2): 4 mg/h via INTRAVENOUS
  Filled 2015-02-27 (×4): qty 200

## 2015-02-27 NOTE — Consult Note (Signed)
Reason for Consult: Suspected Pres Referring Physician: Dr Molli Knock  CC: The patient is currently intubated and unable to communicate - history obtained from the chart  HPI: Lee Swanson is an 64 y.o. male with a history of medical noncompliance, hypertension, tobacco use, cocaine use, alcohol use, chronic kidney disease, suicidal ideations, hepatitis C, Barrett's esophagus, and thrombocytopenia,  brought to [facility] after he was found down for an unknown period of time. He was noted to have a blood pressure of 250/155. The patient is now on a Cardene drip and intubated. His sedation was turned off earlier this morning. A UDS was positive for cocaine. An MRI was consistent with posterior reversible encephalopathy syndrome with profound edema throughout the brain stem and an acute left subarachnoid hemorrhage. A CT scan of the head showed a small left subdural hematoma. Neurosurgery was consulted although surgery was not felt to be indicated. Neurology has been asked to evaluate the patient.  Past Medical History  Diagnosis Date  . Chronic kidney disease     Stage IV  . Hypokalemia   . Thrombocytopenia   . Hypertension 01/2015.    Hypertensive emergency.  . Hepatitis C antibody test positive 01/25/2015    HIV testing negative on the same date.  . Elevated troponin I level 01/2015.    Felt to be secondary to demand ischemia in setting of hypertension and cocaine use. Cardiac cath not planned given kidney disease  . Vitamin D deficiency 01/2015.  . Barrett's esophagus 02/05/2015    EGD 01/2015 - anticipate repeat EGD 2019    Past Surgical History  Procedure Laterality Date  . Esophagogastroduodenoscopy N/A 01/27/2015    Procedure: ESOPHAGOGASTRODUODENOSCOPY (EGD);  Surgeon: Iva Boop, MD;  Location: [facility] ENDOSCOPY;  Service: Endoscopy;  Laterality: N/A;    History reviewed. No pertinent family history.  Social History:  reports that he has been smoking Cigarettes.  He has been  smoking about 0.50 packs per day. He has never used smokeless tobacco. He reports that he drinks alcohol. He reports that he uses illicit drugs (Cocaine).  No Known Allergies  Medications:  Scheduled: . antiseptic oral rinse  7 mL Mouth Rinse QID  . chlorhexidine  15 mL Mouth Rinse BID  . feeding supplement (VITAL HIGH PROTEIN)  1,000 mL Per Tube Q24H  . pantoprazole (PROTONIX) IV  40 mg Intravenous QHS    ROS: Unobtainable at this time  Physical Examination: Blood pressure 150/70, pulse 84, temperature 98.1 F (36.7 C), temperature source Core (Comment), resp. rate 20, height 5\' 8"  (1.727 m), weight 48.3 kg (106 lb 7.7 oz), SpO2 98 %.  HEENT-  Normocephalic, no lesions, without obvious abnormality.  Normal external eye and conjunctiva.  Normal TM's bilaterally.  Normal auditory canals and external ears. Normal external nose, mucus membranes and septum.  Normal pharynx. Cardiovascular- S1, S2 normal, pulses palpable throughout   Lungs- chest clear, no wheezing, rales, normal symmetric air entry Abdomen- soft, non-tender; bowel sounds normal; no masses,  no organomegaly Extremities- no edema Lymph-no adenopathy palpable Musculoskeletal-no joint tenderness, deformity or swelling Skin-warm and dry, no hyperpigmentation, vitiligo, or suspicious lesions  Neurological Examination Mental Status: Intubated.  Eyes open.  No verbalizations noted.  Follows some simple commands.   Cranial Nerves: II: patient responds confrontation bilaterally, pupils right 2 mm, left 2 mm,and reactive bilaterally III,IV,VI: doll's response absent bilaterally.  V,VII: corneal reflex present bilaterally  VIII: patient does not respond to verbal stimuli IX,X: gag reflex absent per nursing, XI:  trapezius strength unable to test bilaterally XII: tongue strength unable to test Motor: No movement noted to command on the left.  Some withdrawal noted in the LLE.  Patient moves the right upper and lower extremity to  command although minimally Deep Tendon Reflexes:  2+ throughout. Plantars: upgoing bilaterally Cerebellar: Unable to perform    Laboratory Studies:   Basic Metabolic Panel:  Recent Labs Lab 02/26/15 0731 02/26/15 0756 02/26/15 0935 02/26/15 1349 02/27/15 0430  NA 135 136 137 136 138  K 3.7 3.3* 2.7* 3.2* 4.2  CL 91* 95* 97* 97* 103  CO2 29  --  25 22 21*  GLUCOSE 109* 113* 114* 111* 183*  BUN 65* 63* 66* 71* 74*  CREATININE 4.71* 4.60* 4.41* 4.75* 5.15*  CALCIUM 8.9  --  8.3* 8.4* 8.0*  MG  --   --   --   --  2.3  PHOS  --   --   --   --  5.0*    Liver Function Tests:  Recent Labs Lab 02/26/15 0731 02/26/15 0935 02/26/15 1349  AST 45* 41 40  ALT ALKPHOS 71 62 58  BILITOT 1.8* 1.9* 1.6*  PROT 7.1 6.2* 5.8*  ALBUMIN 3.8 3.5 3.2*   No results for input(s): LIPASE, AMYLASE in the last 168 hours. No results for input(s): AMMONIA in the last 168 hours.  CBC:  Recent Labs Lab 02/26/15 0731 02/26/15 0756 02/26/15 1349 02/27/15 0430  WBC 6.7  --  7.0 19.6*  NEUTROABS 6.0  --  6.4  --   HGB 13.7 15.6 11.2* 9.9*  HCT 40.5 46.0 32.2* 29.2*  MCV 84.2  --  82.8 84.6  PLT 126*  --  126* 136*    Cardiac Enzymes:  Recent Labs Lab 02/26/15 0731  CKTOTAL 139  TROPONINI 0.04*    BNP: Invalid input(s): POCBNP  CBG:  Recent Labs Lab 02/26/15 2007 02/26/15 2358 02/27/15 0405 02/27/15 0806 02/27/15 1213  GLUCAP 99 123* 148* 200* 145*    Microbiology: Results for orders placed or performed during the hospital encounter of 02/08/15  Urine culture     Status: None   Collection Time: 02/08/15  8:52 AM  Result Value Ref Range Status   Specimen Description URINE, RANDOM  Final   Special Requests NONE  Final   Colony Count NO GROWTH Performed at [facility]   Final   Culture NO GROWTH Performed at [facility]   Final   Report Status 02/09/2015 FINAL  Final    Coagulation Studies:  Recent Labs  02/26/15 0731  02/26/15 1349  LABPROT 13.1 13.2  INR 0.97 0.98    Urinalysis:  Recent Labs Lab 02/26/15 0800  COLORURINE RED*  LABSPEC 1.012  PHURINE 6.0  GLUCOSEU NEGATIVE  HGBUR LARGE*  BILIRUBINUR MODERATE*  KETONESUR 15*  PROTEINUR >300*  UROBILINOGEN 1.0  NITRITE NEGATIVE  LEUKOCYTESUR SMALL*    Lipid Panel:     Component Value Date/Time   CHOL 148 01/25/2015 0346   TRIG 49 01/25/2015 0346   HDL 62 01/25/2015 0346   CHOLHDL 2.4 01/25/2015 0346   VLDL 10 01/25/2015 0346   LDLCALC 76 01/25/2015 0346    HgbA1C: No results found for: HGBA1C  Urine Drug Screen:     Component Value Date/Time   LABOPIA NONE DETECTED 02/26/2015 0800   COCAINSCRNUR POSITIVE* 02/26/2015 0800   LABBENZ NONE DETECTED 02/26/2015 0800   AMPHETMU NONE DETECTED 02/26/2015 0800   THCU NONE DETECTED 02/26/2015  0800   LABBARB NONE DETECTED 02/26/2015 0800    Alcohol Level:  Recent Labs Lab 02/26/15 0731  ETH <5    Other results: EKG: Sinus tachycardia rate 125 bpm with LVH. Please refer to the formal cardiology reading for complete details.   Imaging:   Ct Head Wo Contrast 02/26/2015    Changes suggestive of a small subdural hematoma along the left lateral midbrain. This was not present on the prior exam. Short-term followup is recommended to assess for stability/resolution.  Chronic ischemic changes as described.       Mr Angiogram Head Wo Contrast 02/26/2015    Profound edema throughout the brainstem, also with involvement of the cerebellum and posterior aspects of the cerebral hemispheres, quite likely to represent posterior reversible encephalopathy with predominant brainstem involvement. The differential diagnosis does include toxic demyelination, but that is felt less likely.  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 Acute left perimesencephalic subarachnoid hemorrhage, which is usually the result of venous bleeding rather than aneurysmal  bleeding.  Negative intracranial MR angiography of the large and medium size vessels.       US Renal 02/27/2015    1. Echogenic renal parenchyma bilaterally, compatible with medical renal disease.  2. No hydronephrosis to suggest obstructive uropathy.  3. Trace volume of ascites.  4. Biliary sludge in the gallbladder.      Dg Chest Port 1 View 02/27/2015    New left basilar infiltrate.  Tubes and lines as described.     Dg Chest Port 1 View 02/26/2015    1. Support devices as above.  2. Clear lungs.      Delton See PA-C [facility] Pager [phone] 02/27/2015, 5:20 PM    Patient seen and examined.  Clinical course and management discussed.  Necessary edits performed.  I agree with the above.  Assessment and plan of care developed and discussed below.    Impression: 64 year old male found unresponsive and extremely hypertensive. Imaging consistent with posterior reversible encephalopathy syndrome as well as a subarachnoid hemorrhage and subdural hematoma. Focality noted on examination with patient being unable to move the left. BP has been markedly elevated.    Recommendations: 1.  Continued BP control 2.  Will continue to follow with you.     Thana Farr, MD [facility] [phone]  02/27/2015  5:54 PM

## 2015-02-27 NOTE — H&P (Signed)
PULMONARY / CRITICAL CARE MEDICINE   Name: Lee Swanson MRN: [id number] DOB: 03/20/1951    ADMISSION DATE:  02/26/2015   REFERRING MD :  EDP  CHIEF COMPLAINT: AMS  INITIAL PRESENTATION: AMS/ BP 250/150  STUDIES:  MRI Head>>brain stem edema, micro bleeds 6/12 renal us>>  SIGNIFICANT EVENTS: 6/11 admitted with htn crisis and sah   HISTORY OF PRESENT ILLNESS:   64  Male with hx of substance abuse, medical non compliance, HTN. CRD, suicidal  Ideation who was found down for unknown time, breathing, extremely hypertensive 250/155 and was transported to [facility] ED. CT scan with ? Bleed, started on nipride, intubated, a line placed. NS consulted and MRI ordered. He was cocaine + on admit. PCCM asked to manage.    SUBJECTIVE:   VITAL SIGNS: Temp:  [92.6 F (33.7 C)-101.6 F (38.7 C)] 97.9 F (36.6 C) (06/12 0715) Pulse Rate:  [74-127] 85 (06/12 0743) Resp:  [18-26] 20 (06/12 0743) BP: (110-190)/(75-129) 160/75 mmHg (06/12 0743) SpO2:  [100 %] 100 % (06/12 0743) Arterial Line BP: (132-267)/(70-158) 159/75 mmHg (06/12 0715) FiO2 (%):  [40 %] 40 % (06/12 0743) Weight:  [100 lb 12 oz (45.7 kg)-106 lb 7.7 oz (48.3 kg)] 106 lb 7.7 oz (48.3 kg) (06/12 0400) HEMODYNAMICS: CVP:  [7 mmHg-10 mmHg] 7 mmHg VENTILATOR SETTINGS: Vent Mode:  [-] PRVC FiO2 (%):  [40 %] 40 % Set Rate:  [20 bmp] 20 bmp Vt Set:  [550 mL] 550 mL PEEP:  [5 cmH20] 5 cmH20 Pressure Support:  [5 cmH20] 5 cmH20 Plateau Pressure:  [13 cmH20-17 cmH20] 16 cmH20 INTAKE / OUTPUT:  Intake/Output Summary (Last 24 hours) at 02/27/15 0835 Last data filed at 02/27/15 0700  Gross per 24 hour  Intake 3253.33 ml  Output    295 ml  Net 2958.33 ml    PHYSICAL EXAMINATION: General: Sedated on vent, cachectic  Neuro:  Sedated on vent. Grimace to painful stimuli, on fentanyl drip HEENT:  OTT-> vent Cardiovascular:  HSR RRR Lungs:  decreased in bases Abdomen: soft +bs Musculoskeletal: intact Skin:  Warm and  dry  LABS:  CBC  Recent Labs Lab 02/26/15 0731 02/26/15 0756 02/26/15 1349 02/27/15 0430  WBC 6.7  --  7.0 19.6*  HGB 13.7 15.6 11.2* 9.9*  HCT 40.5 46.0 32.2* 29.2*  PLT 126*  --  126* 136*   Coag's  Recent Labs Lab 02/26/15 0731 02/26/15 1349  APTT 28 28  INR 0.97 0.98   BMET  Recent Labs Lab 02/26/15 0935 02/26/15 1349 02/27/15 0430  NA 137 136 138  K 2.7* 3.2* 4.2  CL 97* 97* 103  CO2 25 22 21*  BUN 66* 71* 74*  CREATININE 4.41* 4.75* 5.15*  GLUCOSE 114* 111* 183*   Electrolytes  Recent Labs Lab 02/26/15 0935 02/26/15 1349 02/27/15 0430  CALCIUM 8.3* 8.4* 8.0*  MG  --   --  2.3  PHOS  --   --  5.0*   Sepsis Markers  Recent Labs Lab 02/26/15 1354  LATICACIDVEN 1.1   ABG  Recent Labs Lab 02/26/15 1140 02/27/15 0422  PHART 7.448 7.408  PCO2ART 35.7 32.8*  PO2ART 211.0* 111*   Liver Enzymes  Recent Labs Lab 02/26/15 0731 02/26/15 0935 02/26/15 1349  AST 45* 41 40  ALT 29 26 25   ALKPHOS 71 62 58  BILITOT 1.8* 1.9* 1.6*  ALBUMIN 3.8 3.5 3.2*   Cardiac Enzymes  Recent Labs Lab 02/26/15 0731  TROPONINI 0.04*   Glucose  Recent Labs  Lab 02/26/15 1233 02/26/15 1609 02/26/15 2007 02/26/15 2358 02/27/15 0405 02/27/15 0806  GLUCAP 151* 67 99 123* 148* 200*    Imaging Mr Angiogram Head Wo Contrast  02/26/2015   CLINICAL DATA:  Intracranial hemorrhage suggested on CT scan yesterday. Found unresponsive.  EXAM: MRI HEAD WITHOUT CONTRAST  MRA HEAD WITHOUT CONTRAST  TECHNIQUE: Multiplanar, multiecho pulse sequences of the brain and surrounding structures were obtained without intravenous contrast. Angiographic images of the head were obtained using MRA technique without contrast.  COMPARISON:  CT 02/26/2015 and 02/08/2015.  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 There are micro hemorrhages present associated with many of the old small vessel infarctions seen scattered within the cerebellum and the cerebral hemispheric white matter. There is a small amount of hemorrhage in the perimesencephalic cistern on the left consistent with venous subarachnoid bleeding. There may be a tiny amount of blood dependent in the occipital horn of the left lateral ventricle.  There is profound edema throughout the brainstem also with involvement of the cerebellum and the posterior aspects of the cerebral hemispheres. 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 Differential diagnosis includes toxic demyelination. No evidence of obstructive hydrocephalus. No sign of neoplastic mass lesion. No extra-axial collection. No pituitary mass. No inflammatory sinus disease.  MRA HEAD FINDINGS  Both internal carotid arteries are widely patent through the siphon region. The anterior and middle cerebral vessels are patent without proximal stenosis, aneurysm or vascular malformation. There is artifactual signal loss.  Both vertebral arteries are patent with the right vertebral artery being dominant and supplying the basilar. The left vertebral artery is a small vessel that terminates in PICA. No basilar stenosis. Major posterior circulation branch vessels are patent. Fetal origin of the right PCA.  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 The differential diagnosis does include toxic demyelination, but that is felt less likely.  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 Acute left perimesencephalic subarachnoid hemorrhage, which is usually the result of venous  bleeding rather than aneurysmal bleeding.  Negative intracranial MR angiography of the large and medium size vessels.   Electronically Signed   By: Paulina Fusi M.D.   On: 02/26/2015 11:16   Mr Brain Wo Contrast  02/26/2015   CLINICAL DATA:  Intracranial hemorrhage suggested on CT scan yesterday. Found unresponsive.  EXAM: MRI HEAD WITHOUT CONTRAST  MRA HEAD WITHOUT CONTRAST  TECHNIQUE: Multiplanar, multiecho pulse sequences of the brain and surrounding structures were obtained without intravenous contrast. Angiographic images of the head were obtained using MRA technique without contrast.  COMPARISON:  CT 02/26/2015 and 02/08/2015.  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 There are micro hemorrhages present associated with many of the old small vessel infarctions seen scattered within the cerebellum and the cerebral hemispheric white matter. There is a small amount of hemorrhage in the perimesencephalic cistern on the left consistent with venous subarachnoid bleeding. There may be a tiny amount of blood dependent  in the occipital horn of the left lateral ventricle.  There is profound edema throughout the brainstem also with involvement of the cerebellum and the posterior aspects of the cerebral hemispheres. 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 Differential diagnosis includes toxic demyelination. No evidence of obstructive hydrocephalus. No sign of neoplastic mass lesion. No extra-axial collection. No pituitary mass. No inflammatory sinus disease.  MRA HEAD FINDINGS  Both internal carotid arteries are widely patent through the siphon region. The anterior and middle cerebral vessels are patent without proximal stenosis, aneurysm or vascular  malformation. There is artifactual signal loss.  Both vertebral arteries are patent with the right vertebral artery being dominant and supplying the basilar. The left vertebral artery is a small vessel that terminates in PICA. No basilar stenosis. Major posterior circulation branch vessels are patent. Fetal origin of the right PCA.  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 The differential diagnosis does include toxic demyelination, but that is felt less likely.  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 Acute left perimesencephalic subarachnoid hemorrhage, which is usually the result of venous bleeding rather than aneurysmal bleeding.  Negative intracranial MR angiography of the large and medium size vessels.   Electronically Signed   By: Paulina Fusi M.D.   On: 02/26/2015 11:16   Dg Chest Port 1 View  02/26/2015   CLINICAL DATA:  Central line and endotracheal tube placements.  EXAM: PORTABLE CHEST - 1 VIEW  COMPARISON:  01/26/2015  FINDINGS: Endotracheal tube has been placed and terminates approximately 4 cm above the carina. Right subclavian central venous catheter has been placed with tip overlying the lower SVC. Cardiomediastinal silhouette is unchanged and within normal limits. The lungs are well inflated and clear. No pleural effusion or pneumothorax is identified. No acute osseous abnormality is seen.  IMPRESSION: 1. Support devices as above. 2. Clear lungs.   Electronically Signed   By: Sebastian Ache   On: 02/26/2015 09:07     ASSESSMENT / PLAN:  PULMONARY OETT 6/11>> A: Intubated to go to mri P:   Vent bundle BD's Wean if able, neuro status prevents extubation  CARDIOVASCULAR CVL 6/11 rt [place] >> A: HTN crisis with cocaine abuse P:   Cardene drip as needed Drug counseling in future if he survives   RENAL Lab Results  Component Value Date   CREATININE 5.15* 02/27/2015   CREATININE 4.75* 02/26/2015   CREATININE 4.41* 02/26/2015    A:   CKD base creatine 4.5 Poor urine output P:   May need renal consult IVF with cvp 7 Renal US for completeness   GASTROINTESTINAL A:   Hx barret's P:   PPI  HEMATOLOGIC A:   Hx of thrombcytopenia P: Follow plts   INFECTIOUS A:   No known infectious process P:    ENDOCRINE A:   No acute issue P:     NEUROLOGIC A:   AMS SDH presumed secondary to htn crisis MRI 6/11 reveals profound edema around brain stem. Chronic micro hemophage with out sah P:   RASS goal: -1 Sedated for vent management MRI head as noted NS following 6/12 will involve neurology   FAMILY  - Updates: None available  - Inter-disciplinary family meet or Palliative Care meeting due by:  day 7  TODAY'S SUMMARY:  63  Male with hx of substance abuse, medical non compliance, HTN. CRD, suicidal  Ideation who was found down for unknown time, breathing, extremely hypertensive 250/155 and was transported to [facility] ED. CT scan with ? Bleed, started on nipride, intubated, a line placed. NS consulted and MRI ordered. He was cocaine + on admit. PCCM asked to manage.  Brett Canales Minor ACNP [facility] PCCM Pager [phone] till 3 pm If no answer page [phone]  64 year old cocaine addict, post use hypertension, likely PRES, previous areas of bleeding noted, very abnormal MRI, no surgical indications however, intubated for airway protection, remains very hypertensive.  Neurologic exam remains very abnormal.  Remains very hypertensive when off cardene.  Will call neuro, maintain on life support for now and will reexamine in AM.  The patient is critically ill with multiple organ systems failure and requires high complexity decision making for assessment and support, frequent evaluation and titration of therapies,  application of advanced monitoring technologies and extensive interpretation of multiple databases.   Critical Care Time devoted to patient care services described in this note is  35  Minutes. This time reflects time of care of this signee Dr Koren Bound. This critical care time does not reflect procedure time, or teaching time or supervisory time of PA/NP/Med student/Med Resident etc but could involve care discussion time.  Alyson Reedy, M.D. [facility] Pulmonary/Critical Care Medicine. Pager: [phone]. After hours pager: [phone].  02/27/2015, 8:35 AM

## 2015-02-27 NOTE — Progress Notes (Signed)
Pt now intubated, no no exam available. However he did get his MRA shows no intracerebral aneurysm. That means he has a perimesencephalic non-aneurysmal subarachnoid hemorrhage. He does look like he probably has reversible toxic encephalopathy secondary to hypertension.  This point he really has no neurosurgical issues. I will sign off. Neurology may be over to help with the hypertensive encephalopathy, though hopefully this will reverse with time.

## 2015-02-28 ENCOUNTER — Inpatient Hospital Stay (HOSPITAL_COMMUNITY): Payer: Medicaid Other

## 2015-02-28 DIAGNOSIS — J9600 Acute respiratory failure, unspecified whether with hypoxia or hypercapnia: Secondary | ICD-10-CM

## 2015-02-28 DIAGNOSIS — Z789 Other specified health status: Secondary | ICD-10-CM

## 2015-02-28 DIAGNOSIS — I6783 Posterior reversible encephalopathy syndrome: Secondary | ICD-10-CM | POA: Diagnosis present

## 2015-02-28 DIAGNOSIS — Z978 Presence of other specified devices: Secondary | ICD-10-CM | POA: Insufficient documentation

## 2015-02-28 LAB — GLUCOSE, CAPILLARY
GLUCOSE-CAPILLARY: 122 mg/dL — AB (ref 65–99)
GLUCOSE-CAPILLARY: 135 mg/dL — AB (ref 65–99)
GLUCOSE-CAPILLARY: 141 mg/dL — AB (ref 65–99)
Glucose-Capillary: 123 mg/dL — ABNORMAL HIGH (ref 65–99)
Glucose-Capillary: 143 mg/dL — ABNORMAL HIGH (ref 65–99)
Glucose-Capillary: 147 mg/dL — ABNORMAL HIGH (ref 65–99)

## 2015-02-28 LAB — CBC
HCT: 27 % — ABNORMAL LOW (ref 39.0–52.0)
HEMOGLOBIN: 8.9 g/dL — AB (ref 13.0–17.0)
MCH: 28.8 pg (ref 26.0–34.0)
MCHC: 33 g/dL (ref 30.0–36.0)
MCV: 87.4 fL (ref 78.0–100.0)
Platelets: 140 10*3/uL — ABNORMAL LOW (ref 150–400)
RBC: 3.09 MIL/uL — ABNORMAL LOW (ref 4.22–5.81)
RDW: 14.4 % (ref 11.5–15.5)
WBC: 18.9 10*3/uL — ABNORMAL HIGH (ref 4.0–10.5)

## 2015-02-28 LAB — MAGNESIUM: Magnesium: 2.2 mg/dL (ref 1.7–2.4)

## 2015-02-28 LAB — BASIC METABOLIC PANEL
ANION GAP: 9 (ref 5–15)
BUN: 89 mg/dL — ABNORMAL HIGH (ref 6–20)
CHLORIDE: 113 mmol/L — AB (ref 101–111)
CO2: 21 mmol/L — ABNORMAL LOW (ref 22–32)
Calcium: 7.8 mg/dL — ABNORMAL LOW (ref 8.9–10.3)
Creatinine, Ser: 5.34 mg/dL — ABNORMAL HIGH (ref 0.61–1.24)
GFR calc Af Amer: 12 mL/min — ABNORMAL LOW (ref >60)
GFR calc non Af Amer: 10 mL/min — ABNORMAL LOW (ref >60)
Glucose, Bld: 138 mg/dL — ABNORMAL HIGH (ref 65–99)
Potassium: 4.1 mmol/L (ref 3.5–5.1)
SODIUM: 143 mmol/L (ref 135–145)

## 2015-02-28 LAB — MRSA PCR SCREENING: MRSA by PCR: NEGATIVE

## 2015-02-28 LAB — PHOSPHORUS: Phosphorus: 4 mg/dL (ref 2.5–4.6)

## 2015-02-28 MED ORDER — LORAZEPAM 2 MG/ML IJ SOLN
1.0000 mg | INTRAMUSCULAR | Status: DC | PRN
  Filled 2015-02-28: qty 1

## 2015-02-28 MED ORDER — METOPROLOL TARTRATE 1 MG/ML IV SOLN
2.5000 mg | INTRAVENOUS | Status: DC | PRN
  Administered 2015-03-03: 5 mg via INTRAVENOUS
  Filled 2015-02-28: qty 5

## 2015-02-28 MED ORDER — FENTANYL CITRATE (PF) 100 MCG/2ML IJ SOLN
25.0000 ug | INTRAMUSCULAR | Status: DC | PRN
  Administered 2015-03-01 – 2015-03-03 (×3): 100 ug via INTRAVENOUS
  Filled 2015-02-28 (×5): qty 2

## 2015-02-28 MED ORDER — FAMOTIDINE 40 MG/5ML PO SUSR
20.0000 mg | Freq: Two times a day (BID) | ORAL | Status: DC
  Administered 2015-02-28 – 2015-03-01 (×4): 20 mg
  Filled 2015-02-28 (×6): qty 2.5

## 2015-02-28 MED ORDER — VITAL AF 1.2 CAL PO LIQD
1000.0000 mL | ORAL | Status: DC
  Administered 2015-02-28 – 2015-03-10 (×10): 1000 mL
  Filled 2015-02-28 (×15): qty 1000

## 2015-02-28 MED ORDER — HYDRALAZINE HCL 20 MG/ML IJ SOLN
10.0000 mg | INTRAMUSCULAR | Status: DC | PRN
  Administered 2015-03-03 (×3): 20 mg via INTRAVENOUS
  Administered 2015-03-03: 40 mg via INTRAVENOUS
  Administered 2015-03-12 – 2015-03-17 (×2): 20 mg via INTRAVENOUS
  Filled 2015-02-28 (×3): qty 2
  Filled 2015-02-28 (×3): qty 1

## 2015-02-28 MED ORDER — AMLODIPINE BESYLATE 10 MG PO TABS
10.0000 mg | ORAL_TABLET | Freq: Every day | ORAL | Status: DC
  Administered 2015-02-28 – 2015-03-10 (×11): 10 mg
  Filled 2015-02-28 (×12): qty 1

## 2015-02-28 MED ORDER — AMLODIPINE 1 MG/ML ORAL SUSPENSION: 10.0000 mg | Freq: Every day | ORAL | Status: DC

## 2015-02-28 NOTE — Progress Notes (Addendum)
PULMONARY / CRITICAL CARE MEDICINE   Name: Lee Swanson MRN: [id number] DOB: 1950-10-27    ADMISSION DATE:  02/26/2015    INITIAL PRESENTATION:  63  Male with hx of substance abuse, medical non compliance, HTN. CRD, suicidal  Ideation who was found down for unknown time, breathing, extremely hypertensive 250/155 and was transported to [facility] ED. CT scan with ? Bleed, started on nipride, intubated. NS consulted and MRI ordered. He was cocaine + on admit. PCCM admitted.  MAJOR EVENTS/TEST RESULTS: 6/11 admitted as above. 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 The differential diagnosis does include toxic demyelination, but that is felt less likely 6/12 renal US: Echogenic renal parenchyma bilaterally, compatible with medical renal disease. No hydronephrosis 6/12 Neurology consultation: PRES vs toxic leukoencephalopathy   INDWELLING DEVICES: L femoral A-line 6/11 >>  ETT 6/11 >>  R [place] CVL 6/11 >>   MICRO DATA: MRSA PCR 6/11 >> NEG  ANTIMICROBIALS:       SUBJECTIVE:  Comatose  VITAL SIGNS: Temp:  [95.2 F (35.1 C)-98.9 F (37.2 C)] 98.9 F (37.2 C) (06/13 1200) Pulse Rate:  [84-107] 98 (06/13 1200) Resp:  [13-21] 13 (06/13 1200) BP: (143-165)/(70-100) 162/92 mmHg (06/13 1204) SpO2:  [96 %-100 %] 100 % (06/13 1200) Arterial Line BP: (152-174)/(72-86) 172/80 mmHg (06/13 1200) FiO2 (%):  [40 %] 40 % (06/13 1200) Weight:  [51.6 kg (113 lb 12.1 oz)] 51.6 kg (113 lb 12.1 oz) (06/13 0400) HEMODYNAMICS: CVP:  [6 mmHg-11 mmHg] 11 mmHg VENTILATOR SETTINGS: Vent Mode:  [-] PRVC FiO2 (%):  [40 %] 40 % Set Rate:  [16 bmp-20 bmp] 16 bmp Vt Set:  [500 mL-550 mL]  500 mL PEEP:  [5 cmH20] 5 cmH20 Plateau Pressure:  [18 cmH20-22 cmH20] 19 cmH20 INTAKE / OUTPUT:  Intake/Output Summary (Last 24 hours) at 02/28/15 1405 Last data filed at 02/28/15 1321  Gross per 24 hour  Intake 4171.5 ml  Output    720 ml  Net 3451.5 ml    PHYSICAL EXAMINATION: General: Cachectic, RASS -4, -5  Neuro: minimal W/D of BLE HEENT: temporal wasting, NAD Cardiovascular: Reg, no M Lungs: clear Abdomen: Soft, nondistended, +BS Ext: no edema  LABS:  CBC  Recent Labs Lab 02/26/15 1349 02/27/15 0430 02/28/15 0425  WBC 7.0 19.6* 18.9*  HGB 11.2* 9.9* 8.9*  HCT 32.2* 29.2* 27.0*  PLT 126* 136* 140*   Coag's  Recent Labs Lab 02/26/15 0731 02/26/15 1349  APTT 28 28  INR 0.97 0.98   BMET  Recent Labs Lab 02/26/15 1349 02/27/15 0430 02/28/15 0425  NA 136 138 143  K 3.2* 4.2 4.1  CL 97* 103 113*  CO2 22 21* 21*  BUN 71* 74* 89*  CREATININE 4.75* 5.15* 5.34*  GLUCOSE 111* 183* 138*   Electrolytes  Recent Labs Lab 02/26/15 1349 02/27/15 0430 02/28/15 0425  CALCIUM 8.4* 8.0* 7.8*  MG  --  2.3 2.2  PHOS  --  5.0* 4.0   Sepsis Markers  Recent Labs Lab 02/26/15 1354  LATICACIDVEN 1.1   ABG  Recent Labs Lab 02/26/15 1140 02/27/15 0422  PHART 7.448 7.408  PCO2ART 35.7 32.8*  PO2ART 211.0* 111*   Liver Enzymes  Recent Labs Lab 02/26/15 0731 02/26/15 0935 02/26/15 1349  AST 45* 41 40  ALT 29 26 25   ALKPHOS 71 62 58  BILITOT 1.8* 1.9* 1.6*  ALBUMIN 3.8 3.5 3.2*   Cardiac Enzymes  Recent Labs Lab 02/26/15 0731  TROPONINI 0.04*   Glucose  Recent Labs Lab 02/27/15 1632 02/27/15 2001 02/27/15 2348 02/28/15 0352 02/28/15 0809 02/28/15 1152  GLUCAP 145* 164* 141* 123* 122* 143*    CXR: RLL atx/eff  ASSESSMENT / PLAN:  PULMONARY A: Acute VDRF due to AMS P:   Cont full vent support - settings reviewed and/or adjusted Cont vent bundle Daily SBT if/when meets criteria  CARDIOVASCULAR A: Malignant  hypertension P:  Amlodipine 6/13 PRN hydralazine to maintain SBP < 170 mmHg PRN metoprolol to maintain HR < 115/min Wean nicardipine to off as tolerated maintaining MAP < 170 mmHg   RENAL A:   AKI, oliguric CKD Not currently a candidate for HD P:   Monitor BMET intermittently Monitor I/Os Correct electrolytes as indicated  GASTROINTESTINAL A:   Hx of GERD/esophagitis Chronic PPI use P:   SUP: PPI Cont TFs  HEMATOLOGIC A:   Mild thrombocytopenia, chronic Anemia without overt blood loss P: DVT px: SCDs Monitor CBC intermittently Transfuse per usual ICU guidelines   INFECTIOUS A:   No acute issues P:   Monitor temp, WBC count Micro and abx as above  ENDOCRINE A:   Mild hyperglycemia without prior dx of DM P:   Monitor glu on chem panels Consider SSI for glu > 180   NEUROLOGIC A:   Hypertensive encephalopathy Possible PRES vs toxic leukoencephalopathy Chronic micro hemophage with out SAH P:   RASS goal: -1 Dc all sedation Neurology following  FAMILY:  None available  Needs prognostication from neurology, then goals of care   35 mins CCM time  Billy Fischer, MD ; [facility] service Mobile [phone].  After 5:30 PM or weekends, call [phone]   02/28/2015, 2:05 PM

## 2015-02-28 NOTE — Progress Notes (Signed)
Nutrition Follow-up / Consult  DOCUMENTATION CODES:  Underweight, Severe malnutrition in context of chronic illness  INTERVENTION:   Continue TF via OGT, change to Vital AF 1.2 at goal rate of 1200 ml (50 ml/h) to provide 1440 kcals, 90 gm protein, 973 ml free water daily.  NUTRITION DIAGNOSIS:  Malnutrition related to chronic illness, inability to eat as evidenced by estimated needs, NPO status, severe depletion of muscle mass, severe depletion of body fat.  Ongoing  GOAL:  Patient will meet greater than or equal to 90% of their needs  Met  MONITOR:  Vent status, TF tolerance, Weight trends  ASSESSMENT: Patient admitted on 6/11 with AMS, found unresponsive, HTN crisis, and SAH. MRI head showed brain stem edema, micro bleeds.  Received consult for assessment of nutrition status. Initial nutrition assessment completed 6/11. Patient is currently receiving Vital High Protein via OGT at 50 ml/h (1200 ml/day) to provide 1200 kcals, 105 gm protein, 1003 ml free water daily.   Patient is currently intubated on ventilator support MV: 7.2 L/min Temp (24hrs), Avg:97.8 F (36.6 C), Min:96.8 F (36 C), Max:98.5 F (36.9 C)  Propofol: none   Height:  Ht Readings from Last 1 Encounters:  02/26/15 5' 8" (1.727 m)    Weight:  Wt Readings from Last 1 Encounters:  02/28/15 113 lb 12.1 oz (51.6 kg)    Ideal Body Weight:  70 kg  Wt Readings from Last 10 Encounters:  02/28/15 113 lb 12.1 oz (51.6 kg)  02/08/15 115 lb (52.164 kg)  01/29/15 115 lb 11.2 oz (52.481 kg)    BMI:  Body mass index is 17.3 kg/(m^2).  Estimated Nutritional Needs:  Kcal:  1407  Protein:  75-95 gm  Fluid:  1.6-1.8 L  Skin:  Reviewed, no issues  Diet Order:   NPO  EDUCATION NEEDS:  No education needs identified at this time   Intake/Output Summary (Last 24 hours) at 02/28/15 1153 Last data filed at 02/28/15 0700  Gross per 24 hour  Intake   3970 ml  Output    560 ml  Net   3410 ml     Last BM:  unknown   Kimberly Harris, RD, LDN, CNSC Pager [phone] After Hours Pager [phone]  

## 2015-02-28 NOTE — Progress Notes (Signed)
Subjective: No significant changes overnight  Exam: Filed Vitals:   02/28/15 0753  BP: 155/84  Pulse: 97  Temp:   Resp: 20   Gen: In bed, very thin CV: normal rate Resp: intubated Abd: soft Ext: no edema.  MS: Opens eyes to voice CN: R pupil 3 mm, L pupil 77mm both are reactive. L eye outwardly deviated compared to right. Does not blink to threat.  Motor: L arm flaccid, minimal withdrawal bilateral lower extremities. Holds arm up on the right, but does not follow commands.  Sensory:responsd to nox stim in bilateral hands.   Pertinent Labs: Elevated creatinine  Impression: 64 yo M with extensive edema restricted solely to the white matter in both the bilateral hemispheres as well as posterior fossa. 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   Recommendations: 1) Continue supportive care, agree with BP goals of 160s systolic.  2) will continue to follow.    Ritta Slot, MD [facility] [phone]  If 7pm- 7am, please page neurology on call as listed in AMION.

## 2015-02-28 NOTE — Plan of Care (Signed)
Problem: Consults Goal: Ventilated Patients Patient Education See Patient Education Module for education specifics.  Outcome: Not Progressing Patient unresponsive.  Comments:  Patient unresponsive at this time.

## 2015-03-01 ENCOUNTER — Inpatient Hospital Stay (HOSPITAL_COMMUNITY): Payer: Medicaid Other

## 2015-03-01 LAB — CBC
HEMATOCRIT: 24.5 % — AB (ref 39.0–52.0)
Hemoglobin: 8 g/dL — ABNORMAL LOW (ref 13.0–17.0)
MCH: 28.9 pg (ref 26.0–34.0)
MCHC: 32.7 g/dL (ref 30.0–36.0)
MCV: 88.4 fL (ref 78.0–100.0)
PLATELETS: 141 10*3/uL — AB (ref 150–400)
RBC: 2.77 MIL/uL — AB (ref 4.22–5.81)
RDW: 14.5 % (ref 11.5–15.5)
WBC: 10.5 10*3/uL (ref 4.0–10.5)

## 2015-03-01 LAB — BASIC METABOLIC PANEL
ANION GAP: 8 (ref 5–15)
BUN: 105 mg/dL — ABNORMAL HIGH (ref 6–20)
CHLORIDE: 117 mmol/L — AB (ref 101–111)
CO2: 20 mmol/L — AB (ref 22–32)
Calcium: 8 mg/dL — ABNORMAL LOW (ref 8.9–10.3)
Creatinine, Ser: 5.69 mg/dL — ABNORMAL HIGH (ref 0.61–1.24)
GFR calc non Af Amer: 9 mL/min — ABNORMAL LOW (ref >60)
GFR, EST AFRICAN AMERICAN: 11 mL/min — AB (ref >60)
Glucose, Bld: 140 mg/dL — ABNORMAL HIGH (ref 65–99)
Potassium: 4.1 mmol/L (ref 3.5–5.1)
SODIUM: 145 mmol/L (ref 135–145)

## 2015-03-01 LAB — GLUCOSE, CAPILLARY
GLUCOSE-CAPILLARY: 126 mg/dL — AB (ref 65–99)
GLUCOSE-CAPILLARY: 131 mg/dL — AB (ref 65–99)
Glucose-Capillary: 119 mg/dL — ABNORMAL HIGH (ref 65–99)
Glucose-Capillary: 105 mg/dL — ABNORMAL HIGH (ref 65–99)
Glucose-Capillary: 110 mg/dL — ABNORMAL HIGH (ref 65–99)

## 2015-03-01 MED ORDER — SODIUM CHLORIDE 0.9 % IV SOLN
500.0000 mg | INTRAVENOUS | Status: DC
  Administered 2015-03-03: 500 mg via INTRAVENOUS
  Filled 2015-03-01: qty 500

## 2015-03-01 MED ORDER — VANCOMYCIN HCL IN DEXTROSE 1-5 GM/200ML-% IV SOLN
1000.0000 mg | Freq: Once | INTRAVENOUS | Status: AC
  Administered 2015-03-01: 1000 mg via INTRAVENOUS
  Filled 2015-03-01: qty 200

## 2015-03-01 MED ORDER — FREE WATER
200.0000 mL | Freq: Three times a day (TID) | Status: DC
  Administered 2015-03-01 – 2015-03-02 (×4): 200 mL

## 2015-03-01 MED ORDER — DEXMEDETOMIDINE HCL IN NACL 200 MCG/50ML IV SOLN
0.4000 ug/kg/h | INTRAVENOUS | Status: DC
  Administered 2015-03-01 (×3): 0.4 ug/kg/h via INTRAVENOUS
  Filled 2015-03-01 (×3): qty 50

## 2015-03-01 MED ORDER — CEFTRIAXONE SODIUM IN DEXTROSE 20 MG/ML IV SOLN
1.0000 g | INTRAVENOUS | Status: AC
  Administered 2015-03-01 – 2015-03-10 (×10): 1 g via INTRAVENOUS
  Filled 2015-03-01 (×10): qty 50

## 2015-03-01 NOTE — Plan of Care (Signed)
Problem: Consults Goal: Ventilated Patients Patient Education See Patient Education Module for education specifics.  Outcome: Not Progressing Patient remains on ventilator.  Comments:  Remains on ventilator.

## 2015-03-01 NOTE — Progress Notes (Signed)
ANTIBIOTIC CONSULT NOTE - INITIAL  Pharmacy Consult for vancomycin Indication: rule out pneumonia  No Known Allergies Patient Measurements: Height: 5\' 8"  (172.7 cm) Weight: 113 lb 12.1 oz (51.6 kg) IBW/kg (Calculated) : 68.4 Vital Signs: Temp: 99.6 F (37.6 C) (06/14 0600) Temp Source: Core (Comment) (06/14 0600) BP: 181/81 mmHg (06/14 0714) Pulse Rate: 100 (06/14 0714) Intake/Output from previous day: 06/13 0701 - 06/14 0700 In: 1984.8 [I.V.:771.5; NG/GT:1213.3] Out: 815 [Urine:815] Labs:  Recent Labs  02/27/15 0430 02/28/15 0425 03/01/15 0541  WBC 19.6* 18.9* 10.5  HGB 9.9* 8.9* 8.0*  PLT 136* 140* 141*  CREATININE 5.15* 5.34* 5.69*   Estimated Creatinine Clearance: 9.6 mL/min (by C-G formula based on Cr of 5.69). No results for input(s): VANCOTROUGH, VANCOPEAK, VANCORANDOM, GENTTROUGH, GENTPEAK, GENTRANDOM, TOBRATROUGH, TOBRAPEAK, TOBRARND, AMIKACINPEAK, AMIKACINTROU, AMIKACIN in the last 72 hours.   Microbiology: Recent Results (from the past 720 hour(s))  Urine culture     Status: None   Collection Time: 02/08/15  8:52 AM  Result Value Ref Range Status   Specimen Description URINE, RANDOM  Final   Special Requests NONE  Final   Colony Count NO GROWTH Performed at [facility]   Final   Culture NO GROWTH Performed at [facility]   Final   Report Status 02/09/2015 FINAL  Final  MRSA PCR Screening     Status: None   Collection Time: 02/28/15  9:35 AM  Result Value Ref Range Status   MRSA by PCR NEGATIVE NEGATIVE Final    Comment:        The GeneXpert MRSA Assay (FDA approved for NASAL specimens only), is one component of a comprehensive MRSA colonization surveillance program. It is not intended to diagnose MRSA infection nor to guide or monitor treatment for MRSA infections.     Medical History: Past Medical History  Diagnosis Date  . Chronic kidney disease     Stage IV  . Hypokalemia   . Thrombocytopenia   . Hypertension  01/2015.    Hypertensive emergency.  . Hepatitis C antibody test positive 01/25/2015    HIV testing negative on the same date.  . Elevated troponin I level 01/2015.    Felt to be secondary to demand ischemia in setting of hypertension and cocaine use. Cardiac cath not planned given kidney disease  . Vitamin D deficiency 01/2015.  . Barrett's esophagus 02/05/2015    EGD 01/2015 - anticipate repeat EGD 2019   Assessment: 64 year old male with increased thick secretions and fever to start antibiotics for rule out pneumonia. MD starting ceftriaxone and vancomycin per pharmacy.   WBC is down to within normal limits. Tmax 101.7. Respiratory culture is being obtained. Patient has CKD. SCr continues to rise at 5.69 - patient is not currently a candidate for HD. Patient is making urine at ~0.7 cc/kg/hr. Renal US has been conducted showing no hydronephrosis.   Goal of Therapy:  Vancomycin trough level 15-20 mcg/ml  Plan:  Vancomycin 1000mg  IV x1, then 500mg  IV every 48 hours. Monitor renal function, clinical status, and culture results.   Vancomycin trough at [facility] as appropriate.   Link Snuffer, PharmD, BCPS Clinical Pharmacist [phone] 03/01/2015,10:22 AM

## 2015-03-01 NOTE — Clinical Social Work Note (Signed)
Clinical Social Worker contacted [employer] ([employer]) in reference to locating family. Patient address listed and emergency contact information provided to [employer]. [employer] familiar with previous EMS calls on behalf of patient. [employer] to contact CSW with updates. Patient is currently intubated/ on ventilator. CSW awaiting updates from [employer] and remains available as needed.   Derenda Fennel, MSW, LCSWA [phone] 03/01/2015 10:26 AM

## 2015-03-01 NOTE — Progress Notes (Addendum)
PULMONARY / CRITICAL CARE MEDICINE   Name: Lee Swanson MRN: [id number] DOB: May 09, 1951    ADMISSION DATE:  02/26/2015    INITIAL PRESENTATION:  45  Male with hx of substance abuse, medical non compliance, HTN. CRD, suicidal  Ideation who was found down for unknown time, breathing, extremely hypertensive 250/155 and was transported to [facility] ED. CT scan with ? Bleed, started on nipride, intubated. NS consulted and MRI ordered. He was cocaine + on admit. PCCM admitted.  MAJOR EVENTS/TEST RESULTS: 6/11 admitted as above. 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 The differential diagnosis does include toxic demyelination, but that is felt less likely 6/12 renal US: Echogenic renal parenchyma bilaterally, compatible with medical renal disease. No hydronephrosis 6/12 Neurology consultation: PRES vs toxic leukoencephalopathy  6/14 More responsive. Fever, copious purulent secretions. Resp culture and empiric abx initiated. Precedex initiated  INDWELLING DEVICES: L femoral A-line 6/11 >>  ETT 6/11 >>  R [place] CVL 6/11 >>   MICRO DATA: MRSA PCR 6/11 >> NEG Resp 6/14 >>   ANTIMICROBIALS:  Vanc 6/14 >>  Ceftriaxone 6/14 >>    SUBJECTIVE:  RASS -2. Partial eye opening to voice. Not F/C  VITAL SIGNS: Temp:  [97.2 F (36.2 C)-101.7 F (38.7 C)] 97.6 F (36.4 C) (06/14 1015) Pulse Rate:  [93-180] 103 (06/14 1015) Resp:  [13-27] 23 (06/14 1015) BP: (140-181)/(71-92) 146/81 mmHg (06/14 1000) SpO2:  [88 %-100 %] 92 % (06/14 1015) Arterial Line BP: (144-184)/(66-82) 157/74 mmHg (06/14 1015) FiO2 (%):  [40 %] 40 % (06/14 0800) HEMODYNAMICS: CVP:  [2 mmHg-11 mmHg] 5  mmHg VENTILATOR SETTINGS: Vent Mode:  [-] CPAP;PSV FiO2 (%):  [40 %] 40 % Set Rate:  [16 bmp] 16 bmp Vt Set:  [500 mL] 500 mL PEEP:  [5 cmH20] 5 cmH20 Pressure Support:  [5 cmH20-10 cmH20] 10 cmH20 Plateau Pressure:  [14 cmH20-20 cmH20] 20 cmH20 INTAKE / OUTPUT:  Intake/Output Summary (Last 24 hours) at 03/01/15 1102 Last data filed at 03/01/15 1017  Gross per 24 hour  Intake 1783.83 ml  Output   1080 ml  Net 703.83 ml    PHYSICAL EXAMINATION: General: Cachectic, RASS -2 Neuro: MAEs HEENT: temporal wasting, NAD Cardiovascular: Reg, no M Lungs: clear Abdomen: Soft, nondistended, +BS Ext: no edema  LABS:  CBC  Recent Labs Lab 02/27/15 0430 02/28/15 0425 03/01/15 0541  WBC 19.6* 18.9* 10.5  HGB 9.9* 8.9* 8.0*  HCT 29.2* 27.0* 24.5*  PLT 136* 140* 141*   Coag's  Recent Labs Lab 02/26/15 0731 02/26/15 1349  APTT 28 28  INR 0.97 0.98   BMET  Recent Labs Lab 02/27/15 0430 02/28/15 0425 03/01/15 0541  NA 138 143 145  K 4.2 4.1 4.1  CL 103 113* 117*  CO2 21* 21* 20*  BUN 74* 89* 105*  CREATININE 5.15* 5.34* 5.69*  GLUCOSE 183* 138* 140*   Electrolytes  Recent Labs Lab 02/27/15 0430 02/28/15 0425 03/01/15 0541  CALCIUM 8.0* 7.8* 8.0*  MG 2.3 2.2  --   PHOS 5.0* 4.0  --    Sepsis Markers  Recent Labs Lab 02/26/15 1354  LATICACIDVEN 1.1   ABG  Recent Labs Lab 02/26/15 1140 02/27/15 0422  PHART 7.448 7.408  PCO2ART 35.7 32.8*  PO2ART  211.0* 111*   Liver Enzymes  Recent Labs Lab 02/26/15 0731 02/26/15 0935 02/26/15 1349  AST 45* 41 40  ALT ALKPHOS 71 62 58  BILITOT 1.8* 1.9* 1.6*  ALBUMIN 3.8 3.5 3.2*   Cardiac Enzymes  Recent Labs Lab 02/26/15 0731  TROPONINI 0.04*   Glucose  Recent Labs Lab 02/28/15 1152 02/28/15 1616 02/28/15 2155 02/28/15 2356 03/01/15 0332 03/01/15 0806  GLUCAP 143* 135* 147* 131* 119* 105*    CXR: Increased bibasilar atx/eff/inf  ASSESSMENT /  PLAN:  PULMONARY A: Acute VDRF due to AMS Suspect PNA P:   Cont full vent support - settings reviewed and/or adjusted Cont vent bundle Daily SBT if/when meets criteria  CARDIOVASCULAR A: Malignant hypertension P:  Cont amlodipine - initiated 6/13  Cont PRN hydralazine to maintain SBP < 170 mmHg Cont PRN metoprolol to maintain HR < 115/min Wean nicardipine to off as tolerated maintaining MAP < 170 mmHg   RENAL A:   AKI, now nonoliguric  No indication for HD presently CKD, 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 P:   All meds adjusted for Cr Cl Monitor BMET intermittently Monitor I/Os Correct electrolytes as indicated  GASTROINTESTINAL A:   Hx of GERD/esophagitis Chronic PPI use P:   SUP: enteral famotidine Cont TFs  HEMATOLOGIC A:   Mild chronic thrombocytopenia Anemia without overt blood loss P: DVT px: SCDs Monitor CBC intermittently Transfuse per usual guidelines   INFECTIOUS A:   Suspected early HCAP P:   Monitor temp, WBC count Micro and abx as above  ENDOCRINE A:   Mild hyperglycemia without prior dx of DM P:   Monitor glu on chem panels Consider SSI for glu > 180   NEUROLOGIC A:   Hypertensive encephalopathy Possible PRES vs toxic leukoencephalopathy Chronic micro hemophage with out SAH P:   RASS goal: -1 Dexmedetomidine initiated 6/14 Neurology following  FAMILY:  None available  CCM X 35 mins  Billy Fischer, MD ; [facility] service Mobile [phone].  After 5:30 PM or weekends, call [phone]   03/01/2015, 11:02 AM

## 2015-03-01 NOTE — Clinical Social Work Note (Signed)
Clinical Social Worker unsuccessful at locating family today. Patient lives at a boarding house. CSW awaiting a phone call from boarding house resident for additional information.   CSW remains available as needed.   Derenda Fennel, MSW, LCSWA [phone] 03/01/2015 4:31 PM

## 2015-03-01 NOTE — Progress Notes (Signed)
Subjective: Possibly some improvement  Exam: Filed Vitals:   03/01/15 1015  BP:   Pulse: 103  Temp: 97.6 F (36.4 C)  Resp: 23   Gen: In bed, very thin CV: normal rate Resp: intubated Abd: soft Ext: no edema.  MS: Opens eyes to voice CN: R pupil 2 mm, L pupil 97mm both are reactive. L eye slightly outwardly deviated compared to right. Does not blink to threat.  Motor: Flexes left arm to pain today, better tone. Withdraws bilateral lower extremities, voluntary movements of the right arm.   Sensory:responsd to nox stim in bilateral hands.   Pertinent Labs: Elevated creatinine  Impression: 64 yo M with extensive edema restricted solely to the white matter in both the bilateral hemispheres as well as posterior fossa. 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   I think that he has a good chance at recovery whether this represents PRES or toxic leukoencephaloapthy, though the latter would be a more prolonged recovery.    Recommendations: 1) Continue supportive care, agree with BP goals of 160s systolic 2) will continue to follow.    Ritta Slot, MD [facility] [phone]  If 7pm- 7am, please page neurology on call as listed in AMION.

## 2015-03-02 ENCOUNTER — Inpatient Hospital Stay (HOSPITAL_COMMUNITY): Payer: Medicaid Other

## 2015-03-02 DIAGNOSIS — J189 Pneumonia, unspecified organism: Secondary | ICD-10-CM

## 2015-03-02 DIAGNOSIS — R401 Stupor: Secondary | ICD-10-CM

## 2015-03-02 LAB — GLUCOSE, CAPILLARY
GLUCOSE-CAPILLARY: 125 mg/dL — AB (ref 65–99)
GLUCOSE-CAPILLARY: 136 mg/dL — AB (ref 65–99)
Glucose-Capillary: 133 mg/dL — ABNORMAL HIGH (ref 65–99)
Glucose-Capillary: 134 mg/dL — ABNORMAL HIGH (ref 65–99)

## 2015-03-02 LAB — CBC
HCT: 23.4 % — ABNORMAL LOW (ref 39.0–52.0)
Hemoglobin: 7.7 g/dL — ABNORMAL LOW (ref 13.0–17.0)
MCH: 29.1 pg (ref 26.0–34.0)
MCHC: 32.9 g/dL (ref 30.0–36.0)
MCV: 88.3 fL (ref 78.0–100.0)
PLATELETS: 141 10*3/uL — AB (ref 150–400)
RBC: 2.65 MIL/uL — AB (ref 4.22–5.81)
RDW: 14.4 % (ref 11.5–15.5)
WBC: 7.1 10*3/uL (ref 4.0–10.5)

## 2015-03-02 LAB — COMPREHENSIVE METABOLIC PANEL
ALBUMIN: 1.7 g/dL — AB (ref 3.5–5.0)
ALT: 17 U/L (ref 17–63)
ANION GAP: 9 (ref 5–15)
AST: 28 U/L (ref 15–41)
Alkaline Phosphatase: 55 U/L (ref 38–126)
BUN: 104 mg/dL — ABNORMAL HIGH (ref 6–20)
CO2: 21 mmol/L — AB (ref 22–32)
Calcium: 8.2 mg/dL — ABNORMAL LOW (ref 8.9–10.3)
Chloride: 116 mmol/L — ABNORMAL HIGH (ref 101–111)
Creatinine, Ser: 5.33 mg/dL — ABNORMAL HIGH (ref 0.61–1.24)
GFR calc non Af Amer: 10 mL/min — ABNORMAL LOW (ref >60)
GFR, EST AFRICAN AMERICAN: 12 mL/min — AB (ref >60)
Glucose, Bld: 139 mg/dL — ABNORMAL HIGH (ref 65–99)
POTASSIUM: 3.9 mmol/L (ref 3.5–5.1)
SODIUM: 146 mmol/L — AB (ref 135–145)
Total Bilirubin: 0.8 mg/dL (ref 0.3–1.2)
Total Protein: 4.9 g/dL — ABNORMAL LOW (ref 6.5–8.1)

## 2015-03-02 MED ORDER — FREE WATER
200.0000 mL | Status: DC
  Administered 2015-03-02 – 2015-03-03 (×5): 200 mL

## 2015-03-02 MED ORDER — FAMOTIDINE 40 MG/5ML PO SUSR
20.0000 mg | Freq: Every day | ORAL | Status: DC
  Administered 2015-03-02 – 2015-03-09 (×8): 20 mg
  Filled 2015-03-02 (×9): qty 2.5

## 2015-03-02 MED ORDER — PNEUMOCOCCAL VAC POLYVALENT 25 MCG/0.5ML IJ INJ: 0.5000 mL | INJECTION | INTRAMUSCULAR | Status: DC | PRN

## 2015-03-02 NOTE — Clinical Documentation Improvement (Signed)
Found down with SDH, Cerebral Edema, Respiratory Failure and likely Toxic Encephalopathy. CKD is documented.   Black male  GFR's for this admission ranging from 11 to 12  Please clarify the stage of CKD your patient has and document findings in next progress note and include in discharge summary if applicable.  _______CKD Stage I - GFR > OR = 90 _______CKD Stage II - GFR 60-80 _______CKD Stage III - GFR 30-59 _______CKD Stage IV - GFR 15-29 _______CKD Stage V - GFR < 15 _______ESRD (End Stage Renal Disease) _______Other condition_____________ _______Cannot Clinically determine   Thank You, Shellee Milo ,RN Clinical Documentation Specialist:  [phone]  [facility]- Health Information Management

## 2015-03-02 NOTE — Clinical Social Work Note (Signed)
Clinical Social Worker has contacted [employer] to locate boarding house landlord. Boarding house owned by [employer]. Staff at property reported Sherral Hammers is the only contact on patient's application.   Sherral Hammers contact information provided: [phone] (listed)               [phone] (busy line)  CSW has contacted both numbers throughout today and left messages at the number listed in chart. CSW also contacted [employer] ([employer]) to visit Sherral Hammers address listed at [address]. [employer] reported address is vacant and will continue to contact CSW as needed with additional updates.   CSW unsuccessful locating family. Clinical Social Worker will sign off for now as social work intervention is no longer needed. Please consult [place] again if new need arises.  Derenda Fennel, MSW, LCSWA [phone] 03/02/2015 2:59 PM

## 2015-03-02 NOTE — Progress Notes (Signed)
PULMONARY / CRITICAL CARE MEDICINE   Name: Lee Swanson MRN: [id number] DOB: 03/07/1951    ADMISSION DATE:  02/26/2015    INITIAL PRESENTATION:  48  Male with hx of substance abuse, medical non compliance, HTN. CRD, suicidal  Ideation who was found down for unknown time, breathing, extremely hypertensive 250/155 and was transported to [facility] ED. CT scan with ? Bleed, started on nipride, intubated. NS consulted and MRI ordered. He was cocaine + on admit. PCCM admitted.  MAJOR EVENTS/TEST RESULTS: 6/11 admitted as above. 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 The differential diagnosis does include toxic demyelination, but that is felt less likely 6/12 renal US: Echogenic renal parenchyma bilaterally, compatible with medical renal disease. No hydronephrosis 6/12 Neurology consultation: PRES vs toxic leukoencephalopathy  6/14 More responsive. Fever, copious purulent secretions. Resp culture and empiric abx initiated. Precedex initiated 6/15 Tolerates PS 5-10 cm H2O. Poor mentation and copious ET secretions prohibited extubation  INDWELLING DEVICES: L femoral A-line 6/11 >> 6/14 ETT 6/11 >>  R [place] CVL 6/11 >>   MICRO DATA: MRSA PCR 6/11 >> NEG Resp 6/14 >>   ANTIMICROBIALS:  Vanc 6/14 >>  Ceftriaxone 6/14 >>    SUBJECTIVE:  RASS -2. Partial eye opening to voice. Not F/C  VITAL SIGNS: Temp:  [98.9 F (37.2 C)-99.8 F (37.7 C)] 99.8 F (37.7 C) (06/15 1141) Pulse Rate:  [72-83] 76 (06/15 1300) Resp:  [0-23] 17 (06/15 1300) BP: (135-167)/(79-135) 145/85 mmHg (06/15 1200) SpO2:  [97 %-100 %] 100 % (06/15 1300) FiO2 (%):  [40 %] 40 % (06/15 1227) Weight:  [52.2  kg (115 lb 1.3 oz)] 52.2 kg (115 lb 1.3 oz) (06/15 0422) HEMODYNAMICS:   VENTILATOR SETTINGS: Vent Mode:  [-] PSV;CPAP FiO2 (%):  [40 %] 40 % Set Rate:  [16 bmp] 16 bmp Vt Set:  [500 mL] 500 mL PEEP:  [5 cmH20] 5 cmH20 Pressure Support:  [10 cmH20] 10 cmH20 Plateau Pressure:  [10 cmH20-20 cmH20] 10 cmH20 INTAKE / OUTPUT:  Intake/Output Summary (Last 24 hours) at 03/02/15 1510 Last data filed at 03/02/15 1344  Gross per 24 hour  Intake 1773.2 ml  Output   1585 ml  Net  188.2 ml    PHYSICAL EXAMINATION: General: Cachectic, RASS -2 Neuro: MAEs HEENT: temporal wasting, NAD Cardiovascular: Reg, no M Lungs: diffuse rhonchi, no wheezes, copious mucopurulent secretions Abdomen: Soft, nondistended, +BS Ext: no edema  LABS:  CBC  Recent Labs Lab 02/28/15 0425 03/01/15 0541 03/02/15 0357  WBC 18.9* 10.5 7.1  HGB 8.9* 8.0* 7.7*  HCT 27.0* 24.5* 23.4*  PLT 140* 141* 141*   Coag's  Recent Labs Lab 02/26/15 0731 02/26/15 1349  APTT 28 28  INR 0.97 0.98   BMET  Recent Labs Lab 02/28/15 0425 03/01/15 0541 03/02/15 0357  NA 143 145 146*  K 4.1 4.1 3.9  CL 113* 117* 116*  CO2 21* 20* 21*  BUN 89* 105* 104*  CREATININE 5.34* 5.69* 5.33*  GLUCOSE 138* 140* 139*   Electrolytes  Recent Labs Lab 02/27/15 0430 02/28/15 0425 03/01/15 0541 03/02/15 0357  CALCIUM 8.0* 7.8* 8.0* 8.2*  MG 2.3 2.2  --   --   PHOS 5.0* 4.0  --   --  Sepsis Markers  Recent Labs Lab 02/26/15 1354  LATICACIDVEN 1.1   ABG  Recent Labs Lab 02/26/15 1140 02/27/15 0422  PHART 7.448 7.408  PCO2ART 35.7 32.8*  PO2ART 211.0* 111*   Liver Enzymes  Recent Labs Lab 02/26/15 0935 02/26/15 1349 03/02/15 0357  AST 41 40 28  ALT ALKPHOS 62 58 55  BILITOT 1.9* 1.6* 0.8  ALBUMIN 3.5 3.2* 1.7*   Cardiac Enzymes  Recent Labs Lab 02/26/15 0731  TROPONINI 0.04*   Glucose  Recent Labs Lab 03/01/15 0332 03/01/15 0806 03/01/15 1137 03/01/15 1550  03/01/15 2337 03/02/15 0749  GLUCAP 119* 105* 126* 110* 125* 133*    CXR: NSC bibasilar atx/eff/inf  ASSESSMENT / PLAN:  PULMONARY A: Acute VDRF due to AMS Suspect PNA P:   Cont vent support - settings reviewed and/or adjusted Wean in PSV mode as tolerated Cont vent bundle Daily SBT if/when meets criteria  CARDIOVASCULAR A: Malignant hypertension, now controlled P:  Cont amlodipine - initiated 6/13 Cont PRN hydralazine to maintain SBP < 170 mmHg Cont PRN metoprolol to maintain HR < 115/min Wean nicardipine to off as tolerated maintaining MAP < 170 mmHg   RENAL A:   AKI, now nonoliguric, Cr improving 6/15 CKD, 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 P:   All meds adjusted for Cr Cl Monitor BMET intermittently Monitor I/Os Correct electrolytes as indicated  GASTROINTESTINAL A:   Hx of GERD/esophagitis Chronic PPI use Protein-calorie malnutrition P:   SUP: enteral famotidine Cont TFs  HEMATOLOGIC A:   Mild chronic thrombocytopenia Anemia without overt blood loss P: DVT px: SCDs Monitor CBC intermittently Transfuse per usual guidelines   INFECTIOUS A:   Suspected early HCAP P:   Monitor temp, WBC count Micro and abx as above  ENDOCRINE A:   Mild hyperglycemia without prior dx of DM P:   Monitor glu on chem panels Consider SSI for glu > 180   NEUROLOGIC A:   Hypertensive encephalopathy Possible PRES vs toxic leukoencephalopathy 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  After 5:30 PM or weekends, call [phone]   03/02/2015, 3:10 PM

## 2015-03-02 NOTE — Progress Notes (Signed)
Subjective: Possibly some improvement  Exam: Filed Vitals:   03/02/15 0700  BP: 162/132  Pulse: 73  Temp:   Resp: 12   Gen: In bed, very thin CV: normal rate Resp: intubated Abd: soft Ext: no edema.  MS: Opens eyes to voice CN: Pupils are equal and reactive both are reactive. Eyes are conjugate. Does not blink to threat.  Motor: Flexes left arm to pain. Withdraws bilateral lower extremities, voluntary movements of the right arm.   Sensory:responsd to nox stim in all 4 ext  Pertinent Labs: Na 146  Impression: 64 yo M with extensive edema restricted solely to the white matter in both the bilateral hemispheres as well as posterior fossa. 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   I think that he has a good chance at recovery whether this represents PRES or toxic leukoencephaloapthy, though the latter would be a more prolonged recovery.    Recommendations: 1) Continue supportive care, could lower BP goals to 140s - 160s 2) will continue to follow.    Ritta Slot, MD [facility] [phone]  If 7pm- 7am, please page neurology on call as listed in AMION.

## 2015-03-03 ENCOUNTER — Inpatient Hospital Stay (HOSPITAL_COMMUNITY): Payer: Medicaid Other

## 2015-03-03 LAB — GLUCOSE, CAPILLARY
GLUCOSE-CAPILLARY: 104 mg/dL — AB (ref 65–99)
GLUCOSE-CAPILLARY: 127 mg/dL — AB (ref 65–99)
GLUCOSE-CAPILLARY: 127 mg/dL — AB (ref 65–99)
Glucose-Capillary: 147 mg/dL — ABNORMAL HIGH (ref 65–99)
Glucose-Capillary: 152 mg/dL — ABNORMAL HIGH (ref 65–99)
Glucose-Capillary: 100 mg/dL — ABNORMAL HIGH (ref 65–99)
Glucose-Capillary: 130 mg/dL — ABNORMAL HIGH (ref 65–99)

## 2015-03-03 LAB — COMPREHENSIVE METABOLIC PANEL
ALBUMIN: 1.9 g/dL — AB (ref 3.5–5.0)
ALT: 22 U/L (ref 17–63)
AST: 30 U/L (ref 15–41)
Alkaline Phosphatase: 74 U/L (ref 38–126)
Anion gap: 14 (ref 5–15)
BUN: 105 mg/dL — ABNORMAL HIGH (ref 6–20)
CALCIUM: 8.1 mg/dL — AB (ref 8.9–10.3)
CO2: 21 mmol/L — ABNORMAL LOW (ref 22–32)
Chloride: 113 mmol/L — ABNORMAL HIGH (ref 101–111)
Creatinine, Ser: 5.06 mg/dL — ABNORMAL HIGH (ref 0.61–1.24)
GFR, EST AFRICAN AMERICAN: 13 mL/min — AB (ref >60)
GFR, EST NON AFRICAN AMERICAN: 11 mL/min — AB (ref >60)
GLUCOSE: 139 mg/dL — AB (ref 65–99)
Potassium: 3.4 mmol/L — ABNORMAL LOW (ref 3.5–5.1)
SODIUM: 148 mmol/L — AB (ref 135–145)
TOTAL PROTEIN: 5.7 g/dL — AB (ref 6.5–8.1)
Total Bilirubin: 0.9 mg/dL (ref 0.3–1.2)

## 2015-03-03 LAB — CBC
HCT: 24.8 % — ABNORMAL LOW (ref 39.0–52.0)
Hemoglobin: 8.1 g/dL — ABNORMAL LOW (ref 13.0–17.0)
MCH: 28.5 pg (ref 26.0–34.0)
MCHC: 32.7 g/dL (ref 30.0–36.0)
MCV: 87.3 fL (ref 78.0–100.0)
PLATELETS: 194 10*3/uL (ref 150–400)
RBC: 2.84 MIL/uL — AB (ref 4.22–5.81)
RDW: 14 % (ref 11.5–15.5)
WBC: 9.3 10*3/uL (ref 4.0–10.5)

## 2015-03-03 MED ORDER — SUCCINYLCHOLINE CHLORIDE 20 MG/ML IJ SOLN
83.0000 mg | INTRAMUSCULAR | Status: AC | PRN
  Filled 2015-03-03: qty 7.5

## 2015-03-03 MED ORDER — FREE WATER
300.0000 mL | Status: DC
  Administered 2015-03-03 – 2015-03-08 (×27): 300 mL

## 2015-03-03 MED ORDER — LIDOCAINE HCL (CARDIAC) 20 MG/ML IV SOLN: 83.0000 mg | INTRAVENOUS | Status: AC | PRN

## 2015-03-03 MED ORDER — POTASSIUM CHLORIDE 20 MEQ/15ML (10%) PO SOLN
40.0000 meq | Freq: Three times a day (TID) | ORAL | Status: AC
  Administered 2015-03-03 (×2): 40 meq
  Filled 2015-03-03 (×3): qty 30

## 2015-03-03 MED ORDER — MIDAZOLAM HCL 2 MG/2ML IJ SOLN: 2.0000 mg | INTRAMUSCULAR | Status: DC | PRN

## 2015-03-03 MED ORDER — INSULIN ASPART 100 UNIT/ML ~~LOC~~ SOLN
2.0000 [IU] | SUBCUTANEOUS | Status: DC
  Administered 2015-03-03: 2 [IU] via SUBCUTANEOUS
  Administered 2015-03-03: 4 [IU] via SUBCUTANEOUS
  Administered 2015-03-04: 2 [IU] via SUBCUTANEOUS

## 2015-03-03 MED ORDER — MIDAZOLAM HCL 2 MG/2ML IJ SOLN
INTRAMUSCULAR | Status: AC
  Filled 2015-03-03: qty 2

## 2015-03-03 MED ORDER — ETOMIDATE 2 MG/ML IV SOLN
16.5000 mg | INTRAVENOUS | Status: AC | PRN
  Administered 2015-03-03: 20 mg via INTRAVENOUS

## 2015-03-03 MED ORDER — VECURONIUM BROMIDE 10 MG IV SOLR: 5.5000 mg | INTRAVENOUS | Status: AC | PRN

## 2015-03-03 MED ORDER — FENTANYL CITRATE (PF) 100 MCG/2ML IJ SOLN
100.0000 ug | INTRAMUSCULAR | Status: DC | PRN
  Administered 2015-03-04: 100 ug via INTRAVENOUS
  Filled 2015-03-03: qty 2

## 2015-03-03 MED ORDER — ATROPINE SULFATE 1 MG/ML IJ SOLN: 1.0000 mg | INTRAMUSCULAR | Status: AC | PRN

## 2015-03-03 MED ORDER — LABETALOL HCL 200 MG PO TABS
200.0000 mg | ORAL_TABLET | Freq: Three times a day (TID) | ORAL | Status: DC
  Administered 2015-03-03 – 2015-03-04 (×3): 200 mg via ORAL
  Filled 2015-03-03 (×5): qty 1

## 2015-03-03 MED ORDER — FENTANYL CITRATE (PF) 100 MCG/2ML IJ SOLN
100.0000 ug | INTRAMUSCULAR | Status: DC | PRN
  Administered 2015-03-03 (×2): 100 ug via INTRAVENOUS

## 2015-03-03 MED ORDER — DOCUSATE SODIUM 50 MG/5ML PO LIQD
100.0000 mg | Freq: Two times a day (BID) | ORAL | Status: DC | PRN
  Filled 2015-03-03: qty 10

## 2015-03-03 MED ORDER — MIDAZOLAM HCL 2 MG/2ML IJ SOLN
INTRAMUSCULAR | Status: AC
  Administered 2015-03-03: 2 mg
  Filled 2015-03-03: qty 2

## 2015-03-03 MED ORDER — ROCURONIUM BROMIDE 50 MG/5ML IV SOLN
55.0000 mg | INTRAVENOUS | Status: AC | PRN
  Filled 2015-03-03: qty 12

## 2015-03-03 NOTE — Progress Notes (Signed)
PULMONARY / CRITICAL CARE MEDICINE   Name: Lee Swanson MRN: [id number] DOB: [birth year]    ADMISSION DATE:  02/26/2015    INITIAL PRESENTATION:  31  Male with hx of substance abuse, medical non compliance, HTN. CRD, suicidal  Ideation who was found down for unknown time, breathing, extremely hypertensive 250/155 and was transported to [facility] ED. CT scan with ? Bleed, started on nipride, intubated. NS consulted and MRI ordered. He was cocaine + on admit. PCCM admitted.  MAJOR EVENTS/TEST RESULTS: 6/11 admitted as above. 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 The differential diagnosis does include toxic demyelination, but that is felt less likely 6/12 renal US: Echogenic renal parenchyma bilaterally, compatible with medical renal disease. No hydronephrosis 6/12 Neurology consultation: PRES vs toxic leukoencephalopathy  6/14 More responsive. Fever, copious purulent secretions. Resp culture and empiric abx initiated. Precedex initiated 6/15 Tolerates PS 5-10 cm H2O. Poor mentation and copious ET secretions prohibited extubation  INDWELLING DEVICES: L femoral A-line 6/11 >> 6/14 ETT 6/11 >>  R [place] CVL 6/11 >>   MICRO DATA: MRSA PCR 6/11 >> NEG Resp 6/14 >>   ANTIMICROBIALS:  Vanc 6/14 >>  Ceftriaxone 6/14 >>   SUBJECTIVE:  Lethargic but follows command on the right.  VITAL SIGNS: Temp:  [98.6 F (37 C)-100.4 F (38 C)] 98.9 F (37.2 C) (06/16 1146) Pulse Rate:  [75-138] 102 (06/16 1100) Resp:  [17-28] 23 (06/16 1100) BP: (147-209)/(84-127) 152/88 mmHg (06/16 1100) SpO2:  [99 %-100 %] 100 % (06/16 1100) FiO2 (%):  [40 %] 40 % (06/16 0802) Weight:  [52.5 kg  (115 lb 11.9 oz)] 52.5 kg (115 lb 11.9 oz) (06/16 0400) HEMODYNAMICS:   VENTILATOR SETTINGS: Vent Mode:  [-] PRVC FiO2 (%):  [40 %] 40 % Set Rate:  [16 bmp] 16 bmp Vt Set:  [500 mL] 500 mL PEEP:  [5 cmH20] 5 cmH20 Pressure Support:  [10 cmH20] 10 cmH20 Plateau Pressure:  [14 cmH20-20 cmH20] 14 cmH20 INTAKE / OUTPUT:  Intake/Output Summary (Last 24 hours) at 03/03/15 1208 Last data filed at 03/03/15 1141  Gross per 24 hour  Intake   2610 ml  Output   2400 ml  Net    210 ml    PHYSICAL EXAMINATION: General: Cachectic, RASS -1 Neuro: MAE on right not left HEENT: temporal wasting, NAD Cardiovascular: Reg, no M Lungs: diffuse rhonchi, no wheezes, copious mucopurulent secretions Abdomen: Soft, nondistended, +BS Ext: no edema  LABS:  CBC  Recent Labs Lab 03/01/15 0541 03/02/15 0357 03/03/15 0420  WBC 10.5 7.1 9.3  HGB 8.0* 7.7* 8.1*  HCT 24.5* 23.4* 24.8*  PLT 141* 141* 194   Coag's  Recent Labs Lab 02/26/15 0731 02/26/15 1349  APTT 28 28  INR 0.97 0.98   BMET  Recent Labs Lab 03/01/15 0541 03/02/15 0357 03/03/15 0420  NA 145 146* 148*  K 4.1 3.9 3.4*  CL 117* 116* 113*  CO2 20* 21* 21*  BUN 105* 104* 105*  CREATININE 5.69* 5.33* 5.06*  GLUCOSE 140* 139* 139*   Electrolytes  Recent Labs Lab 02/27/15 0430 02/28/15 0425 03/01/15 0541 03/02/15 0357 03/03/15 0420  CALCIUM 8.0* 7.8* 8.0* 8.2* 8.1*  MG 2.3 2.2  --   --   --  PHOS 5.0* 4.0  --   --   --    Sepsis Markers  Recent Labs Lab 02/26/15 1354  LATICACIDVEN 1.1   ABG  Recent Labs Lab 02/26/15 1140 02/27/15 0422  PHART 7.448 7.408  PCO2ART 35.7 32.8*  PO2ART 211.0* 111*   Liver Enzymes  Recent Labs Lab 02/26/15 1349 03/02/15 0357 03/03/15 0420  AST 40 28 30  ALT ALKPHOS 58 55 74  BILITOT 1.6* 0.8 0.9  ALBUMIN 3.2* 1.7* 1.9*   Cardiac Enzymes  Recent Labs Lab 02/26/15 0731  TROPONINI 0.04*   Glucose  Recent Labs Lab 03/02/15 0749  03/02/15 1555 03/02/15 2029 03/03/15 0003 03/03/15 0404 03/03/15 0828  GLUCAP 133* 134* 136* 127* 127* 147*    CXR: NSC bibasilar atx/eff/inf  ASSESSMENT / PLAN:  PULMONARY A: Acute VDRF due to AMS Suspect PNA P:   SBT to extubate now. Titrate O2 for sat of 88-92% Ambulate  CARDIOVASCULAR A: Malignant hypertension, now controlled P:  Cont amlodipine - initiated 6/13 Add labetalol 200 mg PO TID with holding parameters (too far out of cocaine to prevent use of beta blockers). Cont PRN hydralazine to maintain SBP < 160 mmHg Cont PRN metoprolol to maintain HR < 100/min Wean nicardipine to off as tolerated maintaining MAP < 160 mmHg  RENAL A:   AKI, now nonoliguric, Cr improving 6/15 CKD, 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 P:   All meds adjusted for Cr Cl Monitor BMET intermittently Monitor I/Os Correct electrolytes as indicated  GASTROINTESTINAL A:   Hx of GERD/esophagitis Chronic PPI use Protein-calorie malnutrition P:   SUP: enteral famotidine Cont TFs  HEMATOLOGIC A:   Mild chronic thrombocytopenia Anemia without overt blood loss P: DVT px: SCDs Monitor CBC intermittently Transfuse per usual guidelines   INFECTIOUS A:   Suspected early HCAP P:   Monitor temp, WBC count Micro and abx as above  ENDOCRINE A:   Mild hyperglycemia without prior dx of DM P:   CBGs ISS sensitive scale added.  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  FAMILY:  None available  The patient is critically ill with multiple organ systems failure and requires high complexity decision making for assessment and support, frequent evaluation and titration of therapies, application of advanced monitoring technologies and extensive interpretation of multiple databases.   Critical Care Time devoted to patient care  services described in this note is  35  Minutes. This time reflects time of care of this signee Dr Koren Bound. This critical care time does not reflect procedure time, or teaching time or supervisory time of PA/NP/Med student/Med Resident etc but could involve care discussion time.  Alyson Reedy, M.D. [facility] Pulmonary/Critical Care Medicine. Pager: [phone]. After hours pager: [phone].   03/03/2015, 12:08 PM

## 2015-03-03 NOTE — Progress Notes (Signed)
MEDICATION RELATED NOTE   Pharmacy Re:  Home Medications  No Known Allergies  Assessment: 64yo male admitted with hx. of substance abuse, medical non-compliance and other medical issues.  We have attempted to confirm his home medications but he is currently unable to participate in a discussion regarding them.  No family noted but a friend states patient had stopped taking all of his medications and he found prescriptions of the following that had not been filled.   Plan:  Please consider medications you want the patient to be continued on once stable and/or discharged.  I have marked his record as complete, should new information become available, pharmacy will be happy to update his records.  Nadara Mustard, PharmD., MS Clinical Pharmacist Pager:  [phone] Thank you for allowing pharmacy to be part of this patients care team. 03/03/2015,11:08 AM

## 2015-03-03 NOTE — Procedures (Signed)
Intubation Procedure Note Lee Swanson [id number] 09-25-1950  Procedure: Intubation Indications: Airway protection and maintenance  Procedure Details Consent: Unable to obtain consent because of emergent medical necessity. Time Out: Verified patient identification, verified procedure, site/side was marked, verified correct patient position, special equipment/implants available, medications/allergies/relevent history reviewed, required imaging and test results available.  Performed  Maximum sterile technique was used including gloves, hand hygiene and mask.  MAC    Evaluation Hemodynamic Status: BP stable throughout; O2 sats: stable throughout Patient's Current Condition: stable Complications: No apparent complications Patient did tolerate procedure well. Chest X-ray ordered to verify placement.  CXR: pending.   Lee Swanson 03/03/2015

## 2015-03-03 NOTE — Procedures (Signed)
Intubation Procedure Note Lee Swanson [id number] 07/22/1951  Procedure: Intubation Indications: Respiratory insufficiency  Procedure Details Consent: Risks of procedure as well as the alternatives and risks of each were explained to the (patient/caregiver).  Consent for procedure obtained. Time Out: Verified patient identification, verified procedure, site/side was marked, verified correct patient position, special equipment/implants available, medications/allergies/relevent history reviewed, required imaging and test results available.  Performed  Maximum sterile technique was used including gloves, gown, hand hygiene and mask.  3    Evaluation Hemodynamic Status: BP stable throughout; O2 sats: stable throughout Patient's Current Condition: stable Complications: No apparent complications Patient did tolerate procedure well. Chest X-ray ordered to verify placement.  CXR: tube position low-repostitioned.  Pt was re-intubated due to respiratory insufficiency. Pt was intubated using a glide scope 8.0 ETT blade 3. Pt stable throughout the procedure. Pt had some pink frothy secretions in the ETT upon manually ventilation. Pt was suction in-line ETT to suction secretions. After suction there were no more pink frothy secretions. RT will continue monitor pt.    Carolan Shiver 03/03/2015

## 2015-03-03 NOTE — Progress Notes (Signed)
Subjective: Possibly some improvement  Exam: Filed Vitals:   03/03/15 0802  BP: 181/99  Pulse: 101  Temp: 100.3 F (37.9 C)  Resp: 25   Gen: In bed, very thin CV: normal rate Resp: intubated Abd: soft Ext: no edema.  MS: Opens eyes to voice. Follow command to stick out tongue and lift arm.  CN: Pupils are equal and reactive both are reactive. Eyes are conjugate.  Motor: Flexes left arm to pain. Withdraws bilateral lower extremities, voluntary movements of the right arm and lifts it to command.  Sensory:responsd to nox stim in all 4 ext  Impression: 64 yo M with extensive edema restricted solely to the white matter in both the bilateral hemispheres as well as posterior fossa. 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   I think that he has a good chance at recovery whether this represents PRES or toxic leukoencephaloapthy, though the latter would be a more prolonged recovery.    Recommendations: 1) Continue supportive care, would favor lowering BP goal with aim at normotension.  2) Neurology to sign off at this time, care is supportive and BP control from here on. Please call with any further questions or concerns.    Ritta Slot, MD [facility] [phone]  If 7pm- 7am, please page neurology on call as listed in AMION.

## 2015-03-03 NOTE — Procedures (Signed)
Extubation Procedure Note  Patient Details:   Name: Lee Swanson DOB: Dec 30, 1950 MRN: [id number]   Airway Documentation:     Evaluation  O2 sats: stable throughout Complications: No apparent complications Patient did tolerate procedure well. Bilateral Breath Sounds: Diminished Suctioning: Oral, Airway Yes   Pt extubated on a 3L/[place]. Pt was asked to say his name post-extubating, and his voice was very faint but pt did state his name. Pt respiratory effort post-extubating was weak and shallow. Pt had a weak cough post extubation. Pt had some growling rattling sound post extubation. Attending MD and RN made aware. RT will continue to monitor pt.   Carolan Shiver 03/03/2015, 2:06 PM

## 2015-03-04 ENCOUNTER — Inpatient Hospital Stay (HOSPITAL_COMMUNITY): Payer: Medicaid Other

## 2015-03-04 DIAGNOSIS — J150 Pneumonia due to Klebsiella pneumoniae: Secondary | ICD-10-CM

## 2015-03-04 DIAGNOSIS — G9340 Encephalopathy, unspecified: Secondary | ICD-10-CM

## 2015-03-04 LAB — URINE DRUGS OF ABUSE SCREEN W ALC, ROUTINE (REF LAB)
Amphetamines, Urine: NEGATIVE ng/mL
Barbiturate, Ur: NEGATIVE ng/mL
Benzodiazepine Quant, Ur: NEGATIVE ng/mL
Cannabinoid Quant, Ur: NEGATIVE ng/mL
ETHANOL U, QUAN: NEGATIVE %
Methadone Screen, Urine: NEGATIVE ng/mL
OPIATE QUANT UR: NEGATIVE ng/mL
PROPOXYPHENE, URINE: NEGATIVE ng/mL
Phencyclidine, Ur: NEGATIVE ng/mL

## 2015-03-04 LAB — GLUCOSE, CAPILLARY
GLUCOSE-CAPILLARY: 113 mg/dL — AB (ref 65–99)
GLUCOSE-CAPILLARY: 108 mg/dL — AB (ref 65–99)
Glucose-Capillary: 131 mg/dL — ABNORMAL HIGH (ref 65–99)

## 2015-03-04 LAB — BASIC METABOLIC PANEL
ANION GAP: 8 (ref 5–15)
BUN: 105 mg/dL — ABNORMAL HIGH (ref 6–20)
CALCIUM: 8.3 mg/dL — AB (ref 8.9–10.3)
CO2: 22 mmol/L (ref 22–32)
CREATININE: 4.63 mg/dL — AB (ref 0.61–1.24)
Chloride: 120 mmol/L — ABNORMAL HIGH (ref 101–111)
GFR calc non Af Amer: 12 mL/min — ABNORMAL LOW (ref >60)
GFR, EST AFRICAN AMERICAN: 14 mL/min — AB (ref >60)
Glucose, Bld: 143 mg/dL — ABNORMAL HIGH (ref 65–99)
Potassium: 4.7 mmol/L (ref 3.5–5.1)
Sodium: 150 mmol/L — ABNORMAL HIGH (ref 135–145)

## 2015-03-04 LAB — CBC
HCT: 21.8 % — ABNORMAL LOW (ref 39.0–52.0)
Hemoglobin: 7 g/dL — ABNORMAL LOW (ref 13.0–17.0)
MCH: 28.7 pg (ref 26.0–34.0)
MCHC: 32.1 g/dL (ref 30.0–36.0)
MCV: 89.3 fL (ref 78.0–100.0)
Platelets: 171 10*3/uL (ref 150–400)
RBC: 2.44 MIL/uL — ABNORMAL LOW (ref 4.22–5.81)
RDW: 14.4 % (ref 11.5–15.5)
WBC: 8.2 10*3/uL (ref 4.0–10.5)

## 2015-03-04 LAB — PHOSPHORUS: Phosphorus: 2.5 mg/dL (ref 2.5–4.6)

## 2015-03-04 LAB — COCAINE CONF, UR
BENZOYLECGONINE CONF, UR: 2199 ng/mL
COCAINE METAB QUANT UR: POSITIVE — AB

## 2015-03-04 LAB — CULTURE, RESPIRATORY W GRAM STAIN

## 2015-03-04 LAB — CULTURE, RESPIRATORY

## 2015-03-04 LAB — MAGNESIUM: Magnesium: 1.9 mg/dL (ref 1.7–2.4)

## 2015-03-04 MED ORDER — FENTANYL CITRATE (PF) 100 MCG/2ML IJ SOLN
25.0000 ug | INTRAMUSCULAR | Status: DC | PRN
  Administered 2015-03-04: 50 ug via INTRAVENOUS
  Administered 2015-03-09 – 2015-03-11 (×3): 25 ug via INTRAVENOUS
  Filled 2015-03-04 (×4): qty 2

## 2015-03-04 MED ORDER — HEPARIN SODIUM (PORCINE) 5000 UNIT/ML IJ SOLN
5000.0000 [IU] | Freq: Three times a day (TID) | INTRAMUSCULAR | Status: DC
  Administered 2015-03-04 – 2015-03-13 (×27): 5000 [IU] via SUBCUTANEOUS
  Filled 2015-03-04 (×30): qty 1

## 2015-03-04 MED ORDER — LABETALOL HCL 200 MG PO TABS
200.0000 mg | ORAL_TABLET | Freq: Three times a day (TID) | ORAL | Status: DC
  Administered 2015-03-04 – 2015-03-06 (×7): 200 mg
  Filled 2015-03-04 (×11): qty 1

## 2015-03-04 NOTE — Progress Notes (Signed)
Placed pt back on PRVC due to pt not triggering breaths after given sedation. Will try to put pt back on NAVA settings again in a few hours

## 2015-03-04 NOTE — Progress Notes (Signed)
Placed pt back on NAVA at this time, pt tolerating well. RT will continue to monitor

## 2015-03-04 NOTE — Progress Notes (Signed)
PT Cancellation/Discharge Note  Patient Details Name: Lee Swanson MRN: [id number] DOB: [birth year]   Cancelled Treatment:    Reason Eval/Treat Not Completed: Medical issues which prohibited therapy. Pt re-intubated and sedated since PT was ordered. Discussed with RN. Will sign-off and please re-order PT when/if appropriate.   Nasirah Sachs 03/04/2015, 3:44 PM Pager [phone]

## 2015-03-04 NOTE — Progress Notes (Signed)
Notified MD Tyson Alias of Hemoglobin of 7.0. Pt is not showing signs of bleeding. Pt is still able to follow simple commands and pupils are reactive to light and equal bilaterally upon neuro assessment. Pt's BP has been mildly hypertensive. Will continue to monitor and assess.

## 2015-03-04 NOTE — Progress Notes (Signed)
PULMONARY / CRITICAL CARE MEDICINE   Name: Lee Swanson MRN: [id number] DOB: 1951-01-11    ADMISSION DATE:  02/26/2015    INITIAL PRESENTATION:  20  Male with hx of substance abuse, medical non compliance, HTN. CRD, suicidal  Ideation who was found down for unknown time, breathing, extremely hypertensive 250/155 and was transported to [facility] ED. CT scan with ? Bleed, started on nipride, intubated. NS consulted and MRI ordered. He was cocaine + on admit. PCCM admitted.  MAJOR EVENTS/TEST RESULTS: 6/11 admitted as above. 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 The differential diagnosis does include toxic demyelination, but that is felt less likely 6/12 renal US: Echogenic renal parenchyma bilaterally, compatible with medical renal disease. No hydronephrosis 6/12 Neurology consultation: PRES vs toxic leukoencephalopathy  6/14 More responsive. Fever, copious purulent secretions. Resp culture and empiric abx initiated. Precedex initiated 6/15 Tolerates PS 5-10 cm H2O. Poor mentation and copious ET secretions prohibited extubation 6/16 Failed extubation almost immediately due to poor handling of oropharyngeal secretions 6/17 Tolerates PS 5-10 cm H2O. + F/C  INDWELLING DEVICES: L femoral A-line 6/11 >> 6/14 ETT 6/11 >>  R [place] CVL 6/11 >>   MICRO DATA: MRSA PCR 6/11 >> NEG Resp 6/14 >> Klebsiella and enterobacter  ANTIMICROBIALS:  Vanc 6/14 >> 6/17 Ceftriaxone 6/14 >>   SUBJECTIVE:  Lethargic but follows command on the right.  VITAL SIGNS: Temp:  [97.6 F (36.4 C)-98.6 F (37 C)] 98.6 F (37 C) (06/17 1540) Pulse Rate:  [65-81] 72 (06/17 1600) Resp:  [11-24] 16  (06/17 1600) BP: (108-150)/(73-88) 145/79 mmHg (06/17 1600) SpO2:  [100 %] 100 % (06/17 1600) FiO2 (%):  [40 %] 40 % (06/17 1518) Weight:  [53.3 kg (117 lb 8.1 oz)] 53.3 kg (117 lb 8.1 oz) (06/17 0415) HEMODYNAMICS:   VENTILATOR SETTINGS: Vent Mode:  [-] Other (Comment) FiO2 (%):  [40 %] 40 % Set Rate:  [16 bmp] 16 bmp Vt Set:  [500 mL] 500 mL PEEP:  [5 cmH20] 5 cmH20 Pressure Support:  [5 cmH20] 5 cmH20 Plateau Pressure:  [13 cmH20-19 cmH20] 13 cmH20 INTAKE / OUTPUT:  Intake/Output Summary (Last 24 hours) at 03/04/15 1649 Last data filed at 03/04/15 1600  Gross per 24 hour  Intake   3280 ml  Output   2535 ml  Net    745 ml    PHYSICAL EXAMINATION: General: Cachectic, RASS -1 Neuro: MAE R>L HEENT: temporal wasting, NAD Cardiovascular: Reg, no M Lungs: clear anteriorly Abdomen: Soft, nondistended, +BS Ext: no edema  LABS:  CBC  Recent Labs Lab 03/02/15 0357 03/03/15 0420 03/04/15 0420  WBC 7.1 9.3 8.2  HGB 7.7* 8.1* 7.0*  HCT 23.4* 24.8* 21.8*  PLT 141* 194 171   Coag's  Recent Labs Lab 02/26/15 0731 02/26/15 1349  APTT 28 28  INR 0.97 0.98   BMET  Recent Labs Lab 03/02/15 0357 03/03/15 0420 03/04/15 0420  NA 146* 148* 150*  K 3.9 3.4* 4.7  CL 116* 113* 120*  CO2 21* 21* 22  BUN 104* 105* 105*  CREATININE 5.33* 5.06* 4.63*  GLUCOSE 139* 139* 143*   Electrolytes  Recent Labs Lab 02/27/15 0430 02/28/15 0425  03/02/15 0357 03/03/15 0420 03/04/15 0420  CALCIUM 8.0* 7.8*  < >  8.2* 8.1* 8.3*  MG 2.3 2.2  --   --   --  1.9  PHOS 5.0* 4.0  --   --   --  2.5  < > = values in this interval not displayed. Sepsis Markers  Recent Labs Lab 02/26/15 1354  LATICACIDVEN 1.1   ABG  Recent Labs Lab 02/26/15 1140 02/27/15 0422  PHART 7.448 7.408  PCO2ART 35.7 32.8*  PO2ART 211.0* 111*   Liver Enzymes  Recent Labs Lab 02/26/15 1349 03/02/15 0357 03/03/15 0420  AST 40 28 30  ALT ALKPHOS 58 55 74  BILITOT 1.6* 0.8 0.9   ALBUMIN 3.2* 1.7* 1.9*   Cardiac Enzymes  Recent Labs Lab 02/26/15 0731  TROPONINI 0.04*   Glucose  Recent Labs Lab 03/03/15 1608 03/03/15 1937 03/03/15 2338 03/04/15 0313 03/04/15 0734 03/04/15 1539  GLUCAP 100* 130* 104* 113* 131* 108*    CXR: NNF  ASSESSMENT / PLAN:  PULMONARY A: Acute VDRF due to AMS PNA P:   Cont vent support - settings reviewed and/or adjusted Wean in PSV as toelrated Cont vent bundle Daily SBT   CARDIOVASCULAR A: Malignant hypertension, now controlled P:  Cont amlodipine - initiated 6/13 Cont labetalol 200 mg TID initiated 6/16 Cont PRN hydralazine to maintain SBP < 160 mmHg Cont PRN metoprolol to maintain HR < 100/min  RENAL A:   AKI, now nonoliguric, Cr improving 6/17 CKD, 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 P:   All meds adjusted for Cr Cl Monitor BMET intermittently Monitor I/Os Correct electrolytes as indicated  GASTROINTESTINAL A:   Hx of GERD/esophagitis Chronic PPI use Protein-calorie malnutrition P:   SUP: enteral famotidine Cont TFs  HEMATOLOGIC A:   Mild thrombocytopenia, resolved Anemia without overt blood loss P: DVT px: SCDs Monitor CBC intermittently Transfuse per usual guidelines   INFECTIOUS A:   GNR PNA (Klebs and enterobacter) P:   Monitor temp, WBC count Micro and abx as above  ENDOCRINE A:   Mild hyperglycemia without prior dx of DM P:   Monitor glu q 8 hrs Consider SSI for glu > 180   NEUROLOGIC A:   Hypertensive encephalopathy PRES vs toxic leukoencephalopathy 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 Note, no family has made contact since his admission  CCM X 35 misn  Billy Fischer, MD ; [facility] [phone].  After 5:30 PM or weekends, call [phone]    03/04/2015, 4:49 PM

## 2015-03-04 NOTE — Progress Notes (Signed)
ANTIBIOTIC CONSULT NOTE - Follow-up  Pharmacy Consult for vancomycin Indication: rule out pneumonia  No Known Allergies Patient Measurements: Height:  (172.7 cm) Weight: 117 lb 8.1 oz (53.3 kg) IBW/kg (Calculated) : 68.4 Vital Signs: Temp: 97.8 F (36.6 C) (06/17 0736) Temp Source: Oral (06/17 0736) BP: 140/78 mmHg (06/17 0700) Pulse Rate: 71 (06/17 0737) Intake/Output from previous day: 06/16 0701 - 06/17 0700 In: 2920 [I.V.:220; NG/GT:2550; IV Piggyback:150] Out: 2125 [Urine:2125] Labs:  Recent Labs  03/02/15 0357 03/03/15 0420 03/04/15 0420  WBC 7.1 9.3 8.2  HGB 7.7* 8.1* 7.0*  PLT 141* 194 171  CREATININE 5.33* 5.06* 4.63*   Estimated Creatinine Clearance: 12.2 mL/min (by C-G formula based on Cr of 4.63). No results for input(s): VANCOTROUGH, VANCOPEAK, VANCORANDOM, GENTTROUGH, GENTPEAK, GENTRANDOM, TOBRATROUGH, TOBRAPEAK, TOBRARND, AMIKACINPEAK, AMIKACINTROU, AMIKACIN in the last 72 hours.   Microbiology: Recent Results (from the past 720 hour(s))  Urine culture     Status: None   Collection Time: 02/08/15  8:52 AM  Result Value Ref Range Status   Specimen Description URINE, RANDOM  Final   Special Requests NONE  Final   Colony Count NO GROWTH Performed at [facility]   Final   Culture NO GROWTH Performed at [facility]   Final   Report Status 02/09/2015 FINAL  Final  MRSA PCR Screening     Status: None   Collection Time: 02/28/15  9:35 AM  Result Value Ref Range Status   MRSA by PCR NEGATIVE NEGATIVE Final    Comment:        The GeneXpert MRSA Assay (FDA approved for NASAL specimens only), is one component of a comprehensive MRSA colonization surveillance program. It is not intended to diagnose MRSA infection nor to guide or monitor treatment for MRSA infections.   Culture, respiratory (NON-Expectorated)     Status: None   Collection Time: 03/01/15  9:55 AM  Result Value Ref Range Status   Specimen Description TRACHEAL  ASPIRATE  Final   Special Requests NONE  Final   Gram Stain   Final    ABUNDANT WBC PRESENT,BOTH PMN AND MONONUCLEAR RARE SQUAMOUS EPITHELIAL CELLS PRESENT MODERATE GRAM VARIABLE ROD FEW GRAM POSITIVE COCCI IN PAIRS FEW GRAM NEGATIVE COCCI    Culture   Final    MODERATE ENTEROBACTER CLOACAE MODERATE KLEBSIELLA PNEUMONIAE Performed at [facility]    Report Status 03/04/2015 FINAL  Final   Organism ID, Bacteria ENTEROBACTER CLOACAE  Final   Organism ID, Bacteria KLEBSIELLA PNEUMONIAE  Final      Susceptibility   Enterobacter cloacae - MIC*    CEFAZOLIN >=64 RESISTANT Resistant     CEFEPIME <=1 SENSITIVE Sensitive     CEFTAZIDIME <=1 SENSITIVE Sensitive     CEFTRIAXONE <=1 SENSITIVE Sensitive     CIPROFLOXACIN <=0.25 SENSITIVE Sensitive     GENTAMICIN <=1 SENSITIVE Sensitive     IMIPENEM <=0.25 SENSITIVE Sensitive     PIP/TAZO <=4 SENSITIVE Sensitive     TOBRAMYCIN <=1 SENSITIVE Sensitive     TRIMETH/SULFA <=20 SENSITIVE Sensitive     * MODERATE ENTEROBACTER CLOACAE   Klebsiella pneumoniae - MIC*    AMPICILLIN >=32 RESISTANT Resistant     AMPICILLIN/SULBACTAM 8 SENSITIVE Sensitive     CEFAZOLIN <=4 SENSITIVE Sensitive     CEFEPIME <=1 SENSITIVE Sensitive     CEFTAZIDIME <=1 SENSITIVE Sensitive     CEFTRIAXONE <=1 SENSITIVE Sensitive     CIPROFLOXACIN <=0.25 SENSITIVE Sensitive     GENTAMICIN <=1 SENSITIVE Sensitive  IMIPENEM <=0.25 SENSITIVE Sensitive     PIP/TAZO <=4 SENSITIVE Sensitive     TOBRAMYCIN <=1 SENSITIVE Sensitive     TRIMETH/SULFA <=20 SENSITIVE Sensitive     * MODERATE KLEBSIELLA PNEUMONIAE    Assessment: 64 year old male with increased thick secretions and fever started on vancomycin + ceftriaxone for possible pneumonia. Today is D#4 of therapy. Tmax is 100.3 and WBC is WNL. Trach aspirate culture reports moderate enterobacter + klebsiella that are sensitive to ceftriaxone.   Vanc 6/14 >> CTX 6/14 >>  6/14 Resp cx >>mod enterobacter + mod  klebsiella (sens to CTX)  Goal of Therapy:  Vancomycin trough level 15-20 mcg/ml  Plan:  - Continue vancomycin 500mg  IV Q48H  - MD - Consider DC vancomycin as bugs growing are sensitive to ceftriaxone - F/u renal fxn, C&S, clinical status and trough at [facility] if continued  Lysle Pearl, PharmD, BCPS Pager # [phone] 03/04/2015 8:18 AM

## 2015-03-05 ENCOUNTER — Inpatient Hospital Stay (HOSPITAL_COMMUNITY): Payer: Medicaid Other

## 2015-03-05 LAB — CBC
HEMATOCRIT: 22.1 % — AB (ref 39.0–52.0)
HEMOGLOBIN: 7.1 g/dL — AB (ref 13.0–17.0)
MCH: 28.9 pg (ref 26.0–34.0)
MCHC: 32.1 g/dL (ref 30.0–36.0)
MCV: 89.8 fL (ref 78.0–100.0)
Platelets: 203 10*3/uL (ref 150–400)
RBC: 2.46 MIL/uL — ABNORMAL LOW (ref 4.22–5.81)
RDW: 14.5 % (ref 11.5–15.5)
WBC: 10.2 10*3/uL (ref 4.0–10.5)

## 2015-03-05 LAB — BASIC METABOLIC PANEL
ANION GAP: 9 (ref 5–15)
BUN: 100 mg/dL — ABNORMAL HIGH (ref 6–20)
CO2: 21 mmol/L — ABNORMAL LOW (ref 22–32)
Calcium: 8.3 mg/dL — ABNORMAL LOW (ref 8.9–10.3)
Chloride: 116 mmol/L — ABNORMAL HIGH (ref 101–111)
Creatinine, Ser: 4.26 mg/dL — ABNORMAL HIGH (ref 0.61–1.24)
GFR, EST AFRICAN AMERICAN: 16 mL/min — AB (ref >60)
GFR, EST NON AFRICAN AMERICAN: 13 mL/min — AB (ref >60)
Glucose, Bld: 144 mg/dL — ABNORMAL HIGH (ref 65–99)
POTASSIUM: 4.3 mmol/L (ref 3.5–5.1)
SODIUM: 146 mmol/L — AB (ref 135–145)

## 2015-03-05 LAB — GLUCOSE, CAPILLARY
GLUCOSE-CAPILLARY: 114 mg/dL — AB (ref 65–99)
GLUCOSE-CAPILLARY: 120 mg/dL — AB (ref 65–99)
GLUCOSE-CAPILLARY: 142 mg/dL — AB (ref 65–99)

## 2015-03-05 NOTE — Progress Notes (Signed)
Placed pt back on PRVC due to no pt effort and pt continually on backup mode setting and asychrony with ventilator settings. RT will continue to monitor

## 2015-03-05 NOTE — Progress Notes (Signed)
PULMONARY / CRITICAL CARE MEDICINE   Name: Lee Swanson MRN: [id number] DOB: 1951/04/07    ADMISSION DATE:  02/26/2015    INITIAL PRESENTATION:  46  Male with hx of substance abuse, medical non compliance, HTN. CRD, suicidal  Ideation who was found down for unknown time, breathing, extremely hypertensive 250/155 and was transported to [facility] ED. CT scan with ? Bleed, started on nipride, intubated. NS consulted and MRI ordered. He was cocaine + on admit. PCCM admitted.  MAJOR EVENTS/TEST RESULTS: 6/11 admitted as above. 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 The differential diagnosis does include toxic demyelination, but that is felt less likely 6/12 renal US: Echogenic renal parenchyma bilaterally, compatible with medical renal disease. No hydronephrosis 6/12 Neurology consultation: PRES vs toxic leukoencephalopathy  6/14 More responsive. Fever, copious purulent secretions. Resp culture and empiric abx initiated. Precedex initiated 6/15 Tolerates PS 5-10 cm H2O. Poor mentation and copious ET secretions prohibited extubation 6/16 Failed extubation almost immediately due to poor handling of oropharyngeal secretions 6/17 Tolerates PS 5-10 cm H2O. + F/C  INDWELLING DEVICES: L femoral A-line 6/11 >> 6/14 ETT 6/11 >>  R [place] CVL 6/11 >>   MICRO DATA: MRSA PCR 6/11 >> NEG Resp 6/14 >> Klebsiella and enterobacter  ANTIMICROBIALS:  Vanc 6/14 >> 6/17 Ceftriaxone 6/14 >>   SUBJECTIVE:  Lethargic but follows command on the right.  VITAL SIGNS: Temp:  [97 F (36.1 C)-98.6 F (37 C)] 98.4 F (36.9 C) (06/18 0812) Pulse Rate:  [63-73] 71 (06/18 0600) Resp:  [10-21] 17  (06/18 0839) BP: (115-156)/(71-89) 156/89 mmHg (06/18 0839) SpO2:  [100 %] 100 % (06/18 0600) FiO2 (%):  [40 %] 40 % (06/18 0839) Weight:  [50.7 kg (111 lb 12.4 oz)] 50.7 kg (111 lb 12.4 oz) (06/18 0500) HEMODYNAMICS:   VENTILATOR SETTINGS: Vent Mode:  [-] PSV;CPAP FiO2 (%):  [40 %] 40 % Set Rate:  [16 bmp] 16 bmp Vt Set:  [500 mL] 500 mL PEEP:  [5 cmH20] 5 cmH20 Pressure Support:  [5 cmH20-12 cmH20] 12 cmH20 Plateau Pressure:  [9 cmH20] 9 cmH20 INTAKE / OUTPUT:  Intake/Output Summary (Last 24 hours) at 03/05/15 1962 Last data filed at 03/05/15 0700  Gross per 24 hour  Intake   2445 ml  Output   2705 ml  Net   -260 ml    PHYSICAL EXAMINATION: General: Cachectic, RASS -1 Neuro: MAE R>L HEENT: temporal wasting, NAD Cardiovascular: Reg, no M Lungs: clear anteriorly Abdomen: Soft, nondistended, +BS Ext: no edema  LABS:  CBC  Recent Labs Lab 03/03/15 0420 03/04/15 0420 03/05/15 0508  WBC 9.3 8.2 10.2  HGB 8.1* 7.0* 7.1*  HCT 24.8* 21.8* 22.1*  PLT 194 171 203   Coag's  Recent Labs Lab 02/26/15 1349  APTT 28  INR 0.98   BMET  Recent Labs Lab 03/03/15 0420 03/04/15 0420 03/05/15 0508  NA 148* 150* 146*  K 3.4* 4.7 4.3  CL 113* 120* 116*  CO2 21* 22 21*  BUN 105* 105* 100*  CREATININE 5.06* 4.63* 4.26*  GLUCOSE 139* 143* 144*   Electrolytes  Recent Labs Lab 02/27/15 0430 02/28/15 0425  03/03/15 0420 03/04/15 0420 03/05/15 0508  CALCIUM 8.0* 7.8*  < > 8.1* 8.3* 8.3*  MG  2.3 2.2  --   --  1.9  --   PHOS 5.0* 4.0  --   --  2.5  --   < > = values in this interval not displayed. Sepsis Markers  Recent Labs Lab 02/26/15 1354  LATICACIDVEN 1.1   ABG  Recent Labs Lab 02/26/15 1140 02/27/15 0422  PHART 7.448 7.408  PCO2ART 35.7 32.8*  PO2ART 211.0* 111*   Liver Enzymes  Recent Labs Lab 02/26/15 1349 03/02/15 0357 03/03/15 0420  AST 40 28 30  ALT ALKPHOS 58 55 74  BILITOT 1.6* 0.8 0.9  ALBUMIN 3.2* 1.7* 1.9*    Cardiac Enzymes No results for input(s): TROPONINI, PROBNP in the last 168 hours. Glucose  Recent Labs Lab 03/03/15 2338 03/04/15 0313 03/04/15 0734 03/04/15 1539 03/04/15 2332 03/05/15 0813  GLUCAP 104* 113* 131* 108* 114* 142*    CXR: NNF  ASSESSMENT / PLAN:  PULMONARY A: Acute VDRF due to AMS PNA P:   Cont vent support - settings reviewed and/or adjusted SBTs as tolerated -defer extubation until cough improved & can handle secretions Cont vent bundle   CARDIOVASCULAR A: Malignant hypertension, now controlled P:  Cont amlodipine - initiated 6/13 Cont labetalol 200 mg TID initiated 6/16 Cont PRN hydralazine to maintain SBP < 160 mmHg Cont PRN metoprolol to maintain HR < 100/min  RENAL A:   AKI, now nonoliguric, Cr improving 6/17 CKD stg 5, 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 P:   All meds adjusted for Cr Cl Monitor BMET intermittently Monitor I/Os Correct electrolytes as indicated  GASTROINTESTINAL A:   Hx of GERD/esophagitis Chronic PPI use Protein-calorie malnutrition P:   SUP: enteral famotidine Cont TFs  HEMATOLOGIC A:   Mild thrombocytopenia, resolved Anemia without overt blood loss P: DVT px: SCDs Monitor CBC intermittently Transfuse per usual guidelines   INFECTIOUS A:   GNR PNA (Klebs and enterobacter) P:   Monitor temp, WBC count Micro and abx as above  ENDOCRINE A:   Mild hyperglycemia without prior dx of DM P:   Monitor glu q 8 hrs Consider SSI for glu > 180   NEUROLOGIC A:   Hypertensive encephalopathy PRES (favor) vs toxic leukoencephalopathy vs uremia 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 Note, no family has made contact since his admission  Summary - Mental status improving, but doubt can handle secretions if extubated - needs more time  The patient is critically ill with  multiple organ systems failure and requires high complexity decision making for assessment and support, frequent evaluation and titration of therapies, application of advanced monitoring technologies and extensive interpretation of multiple databases. Critical Care Time devoted to patient care services described in this note independent of APP time is 35 minutes.   Cyril Mourning MD. [facility]. [facility] Pager [phone] If no response call [phone]     03/05/2015, 9:22 AM

## 2015-03-06 ENCOUNTER — Inpatient Hospital Stay (HOSPITAL_COMMUNITY): Payer: Medicaid Other

## 2015-03-06 LAB — CBC
HCT: 21.2 % — ABNORMAL LOW (ref 39.0–52.0)
Hemoglobin: 6.7 g/dL — CL (ref 13.0–17.0)
MCH: 28.9 pg (ref 26.0–34.0)
MCHC: 31.6 g/dL (ref 30.0–36.0)
MCV: 91.4 fL (ref 78.0–100.0)
PLATELETS: 222 10*3/uL (ref 150–400)
RBC: 2.32 MIL/uL — ABNORMAL LOW (ref 4.22–5.81)
RDW: 14.4 % (ref 11.5–15.5)
WBC: 12.5 10*3/uL — ABNORMAL HIGH (ref 4.0–10.5)

## 2015-03-06 LAB — BASIC METABOLIC PANEL
ANION GAP: 7 (ref 5–15)
BUN: 92 mg/dL — AB (ref 6–20)
CO2: 22 mmol/L (ref 22–32)
Calcium: 8.3 mg/dL — ABNORMAL LOW (ref 8.9–10.3)
Chloride: 117 mmol/L — ABNORMAL HIGH (ref 101–111)
Creatinine, Ser: 3.99 mg/dL — ABNORMAL HIGH (ref 0.61–1.24)
GFR calc non Af Amer: 15 mL/min — ABNORMAL LOW (ref >60)
GFR, EST AFRICAN AMERICAN: 17 mL/min — AB (ref >60)
Glucose, Bld: 130 mg/dL — ABNORMAL HIGH (ref 65–99)
POTASSIUM: 4.1 mmol/L (ref 3.5–5.1)
SODIUM: 146 mmol/L — AB (ref 135–145)

## 2015-03-06 LAB — HEMOGLOBIN AND HEMATOCRIT, BLOOD
HCT: 28.7 % — ABNORMAL LOW (ref 39.0–52.0)
Hemoglobin: 9.3 g/dL — ABNORMAL LOW (ref 13.0–17.0)

## 2015-03-06 LAB — GLUCOSE, CAPILLARY
GLUCOSE-CAPILLARY: 110 mg/dL — AB (ref 65–99)
GLUCOSE-CAPILLARY: 125 mg/dL — AB (ref 65–99)
Glucose-Capillary: 113 mg/dL — ABNORMAL HIGH (ref 65–99)

## 2015-03-06 LAB — PREPARE RBC (CROSSMATCH)

## 2015-03-06 MED ORDER — SODIUM CHLORIDE 0.9 % IV SOLN: Freq: Once | INTRAVENOUS | Status: DC

## 2015-03-06 NOTE — Progress Notes (Signed)
CRITICAL VALUE ALERT  Critical value received:  Hgb=6.7  Date of notification:  03/06/15  Time of notification:  0611  Critical value read back:Yes.    Nurse who received alert:  Dustin Flock  MD notified (1st page):  Dr Deterding  Time of first page:  0617  MD notified (2nd page):  Time of second page:  Responding MD:  Dr Darrick Penna  Time MD responded:  [phone]

## 2015-03-06 NOTE — Progress Notes (Signed)
eLink Physician-Brief Progress Note Patient Name: Lee Swanson DOB: May 27, 1951 MRN: [id number]   Date of Service  03/06/2015  HPI/Events of Note  Hgb drop from 7.1 to 6.7  Has been gradually decreasing over days  eICU Interventions  Plan: Transfuse 1 unit pRBC Post-transfusion CBC     Intervention Category Intermediate Interventions: Other:  DETERDING,ELIZABETH 03/06/2015, 6:17 AM

## 2015-03-06 NOTE — Progress Notes (Signed)
PULMONARY / CRITICAL CARE MEDICINE   Name: Lee Swanson MRN: [id number] DOB: [birth year]    ADMISSION DATE:  02/26/2015    INITIAL PRESENTATION:  75  Male with hx of substance abuse, medical non compliance, HTN. CRD, suicidal  Ideation who was found down for unknown time, breathing, extremely hypertensive 250/155 and was transported to [facility] ED. CT scan with small SDH, started on nipride, intubated. NS consulted and MRI ordered. He was cocaine + on admit. PCCM admitted.  MAJOR EVENTS/TEST RESULTS: 6/11 admitted as above. 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 The differential diagnosis does include toxic demyelination, but that is felt less likely 6/12 renal US: Echogenic renal parenchyma bilaterally, compatible with medical renal disease. No hydronephrosis 6/12 Neurology consultation: PRES vs toxic leukoencephalopathy  6/14 More responsive. Fever, copious purulent secretions. Resp culture and empiric abx initiated. Precedex initiated 6/15 Tolerates PS 5-10 cm H2O. Poor mentation and copious ET secretions prohibited extubation 6/16 Failed extubation almost immediately due to poor handling of oropharyngeal secretions 6/17 Tolerates PS 5-10 cm H2O. + F/C  INDWELLING DEVICES: L femoral A-line 6/11 >> 6/14 ETT 6/11 >>  R [place] CVL 6/11 >>   MICRO DATA: MRSA PCR 6/11 >> NEG Resp 6/14 >> Klebsiella and enterobacter  ANTIMICROBIALS:  Vanc 6/14 >> 6/17 Ceftriaxone 6/14 >>   SUBJECTIVE:  Afebrile Remains Lethargic but follows command on the right.  VITAL SIGNS: Temp:  [98.3 F (36.8 C)-98.8 F (37.1 C)] 98.7 F (37.1 C) (06/19 0805) Pulse Rate:  [68-77] 76 (06/19  0700) Resp:  [9-21] 13 (06/19 0700) BP: (118-159)/(71-96) 146/87 mmHg (06/19 0700) SpO2:  [100 %] 100 % (06/19 0700) FiO2 (%):  [40 %] 40 % (06/19 0600) Weight:  [51.6 kg (113 lb 12.1 oz)] 51.6 kg (113 lb 12.1 oz) (06/19 0500) HEMODYNAMICS:   VENTILATOR SETTINGS: Vent Mode:  [-] PRVC FiO2 (%):  [40 %] 40 % Set Rate:  [16 bmp] 16 bmp Vt Set:  [500 mL] 500 mL PEEP:  [5 cmH20] 5 cmH20 Pressure Support:  [12 cmH20] 12 cmH20 INTAKE / OUTPUT:  Intake/Output Summary (Last 24 hours) at 03/06/15 0808 Last data filed at 03/06/15 0700  Gross per 24 hour  Intake   2965 ml  Output   1910 ml  Net   1055 ml    PHYSICAL EXAMINATION: General: Cachectic, RASS -1 Neuro:RASS 0, follows 1 step commands, moves RUE 3/5, does not move LUE, BLE 3/5 HEENT: temporal wasting, NAD Cardiovascular: Reg, no M Lungs: clear anteriorly Abdomen: Soft, nondistended, +BS Ext: no edema  LABS:  CBC  Recent Labs Lab 03/04/15 0420 03/05/15 0508 03/06/15 0422  WBC 8.2 10.2 12.5*  HGB 7.0* 7.1* 6.7*  HCT 21.8* 22.1* 21.2*  PLT 171 203 222   Coag's No results for input(s): APTT, INR in the last 168 hours. BMET  Recent Labs Lab 03/04/15 0420 03/05/15 0508 03/06/15 0422  NA 150* 146* 146*  K 4.7 4.3 4.1  CL 120* 116* 117*  CO2 22 21* 22  BUN 105* 100* 92*  CREATININE 4.63* 4.26* 3.99*  GLUCOSE 143* 144* 130*   Electrolytes  Recent Labs Lab 02/28/15 0425  03/04/15 0420 03/05/15 0508 03/06/15 0422  CALCIUM 7.8*  < > 8.3* 8.3* 8.3*  MG  2.2  --  1.9  --   --   PHOS 4.0  --  2.5  --   --   < > = values in this interval not displayed. Sepsis Markers No results for input(s): LATICACIDVEN, PROCALCITON, O2SATVEN in the last 168 hours. ABG No results for input(s): PHART, PCO2ART, PO2ART in the last 168 hours. Liver Enzymes  Recent Labs Lab 03/02/15 0357 03/03/15 0420  AST 28 30  ALT 17 22  ALKPHOS 55 74  BILITOT 0.8 0.9  ALBUMIN 1.7* 1.9*   Cardiac Enzymes No results for  input(s): TROPONINI, PROBNP in the last 168 hours. Glucose  Recent Labs Lab 03/04/15 0734 03/04/15 1539 03/04/15 2332 03/05/15 0813 03/05/15 1616 03/06/15 0012  GLUCAP 131* 108* 114* 142* 120* 110*    CXR: NNF  ASSESSMENT / PLAN:  PULMONARY A: Acute VDRF due to AMS PNA P:   SBTs as tolerated -defer extubation until neuro/cough improved & can handle secretions Cont vent bundle   CARDIOVASCULAR A: Malignant hypertension, now controlled P:  Cont amlodipine - initiated 6/13 Cont labetalol 200 mg TID initiated 6/16 Cont PRN hydralazine to maintain SBP < 160 mmHg Cont PRN metoprolol to maintain HR < 100/min  RENAL A:   AKI, now nonoliguric, Cr improving 6/17 CKD stg 5, 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 Note, no family has made contact since his admission  Summary - Mental status improving, but doubt can handle secretions if extubated - needs more time tos ee for neuro recovery if indeed  PRES  The patient is critically ill with multiple organ systems failure and requires high complexity decision making for assessment and support, frequent evaluation and titration of therapies, application of advanced monitoring technologies and extensive interpretation of multiple databases. Critical Care Time devoted to patient care services described in this note independent of APP time is 35 minutes.   Cyril Mourning MD. Tonny Bollman. [facility] Pager [phone] If no response call [phone]     03/06/2015, 8:08 AM

## 2015-03-07 ENCOUNTER — Inpatient Hospital Stay (HOSPITAL_COMMUNITY): Payer: Medicaid Other

## 2015-03-07 DIAGNOSIS — J9600 Acute respiratory failure, unspecified whether with hypoxia or hypercapnia: Secondary | ICD-10-CM | POA: Diagnosis present

## 2015-03-07 LAB — BASIC METABOLIC PANEL
ANION GAP: 8 (ref 5–15)
BUN: 85 mg/dL — ABNORMAL HIGH (ref 6–20)
CALCIUM: 8 mg/dL — AB (ref 8.9–10.3)
CO2: 21 mmol/L — ABNORMAL LOW (ref 22–32)
CREATININE: 3.89 mg/dL — AB (ref 0.61–1.24)
Chloride: 114 mmol/L — ABNORMAL HIGH (ref 101–111)
GFR calc Af Amer: 17 mL/min — ABNORMAL LOW (ref >60)
GFR, EST NON AFRICAN AMERICAN: 15 mL/min — AB (ref >60)
Glucose, Bld: 122 mg/dL — ABNORMAL HIGH (ref 65–99)
Potassium: 3.8 mmol/L (ref 3.5–5.1)
SODIUM: 143 mmol/L (ref 135–145)

## 2015-03-07 LAB — TYPE AND SCREEN
ABO/RH(D): B POS
Antibody Screen: NEGATIVE
Unit division: 0
Unit division: 0

## 2015-03-07 LAB — CBC
HEMATOCRIT: 27.7 % — AB (ref 39.0–52.0)
Hemoglobin: 9 g/dL — ABNORMAL LOW (ref 13.0–17.0)
MCH: 28.8 pg (ref 26.0–34.0)
MCHC: 32.5 g/dL (ref 30.0–36.0)
MCV: 88.5 fL (ref 78.0–100.0)
PLATELETS: 254 10*3/uL (ref 150–400)
RBC: 3.13 MIL/uL — ABNORMAL LOW (ref 4.22–5.81)
RDW: 15.1 % (ref 11.5–15.5)
WBC: 14.7 10*3/uL — ABNORMAL HIGH (ref 4.0–10.5)

## 2015-03-07 LAB — GLUCOSE, CAPILLARY
Glucose-Capillary: 112 mg/dL — ABNORMAL HIGH (ref 65–99)
Glucose-Capillary: 126 mg/dL — ABNORMAL HIGH (ref 65–99)
Glucose-Capillary: 80 mg/dL (ref 65–99)

## 2015-03-07 MED ORDER — MIDAZOLAM HCL 2 MG/2ML IJ SOLN
INTRAMUSCULAR | Status: AC
  Administered 2015-03-07: 4 mg via INTRAVENOUS
  Filled 2015-03-07: qty 4

## 2015-03-07 MED ORDER — FENTANYL CITRATE (PF) 100 MCG/2ML IJ SOLN
INTRAMUSCULAR | Status: AC
  Administered 2015-03-07: 200 ug
  Filled 2015-03-07: qty 4

## 2015-03-07 MED ORDER — LABETALOL HCL 300 MG PO TABS
300.0000 mg | ORAL_TABLET | Freq: Three times a day (TID) | ORAL | Status: DC
  Administered 2015-03-07 – 2015-03-10 (×11): 300 mg
  Filled 2015-03-07 (×15): qty 1

## 2015-03-07 NOTE — Progress Notes (Signed)
Pt bagged to MRI and placed on vent then bagged back to ICU room.

## 2015-03-07 NOTE — Progress Notes (Addendum)
Subjective: Patient remains intubated and not breathing over the ventilatory. Does not follow commands.   Objective: Current vital signs: BP 156/88 mmHg  Pulse 83  Temp(Src) 98.3 F (36.8 C) (Oral)  Resp 16  Ht  (1.727 m)  Wt 53.1 kg (117 lb 1 oz)  BMI 17.80 kg/m2  SpO2 100% Vital signs in last 24 hours: Temp:  [97.2 F (36.2 C)-99.2 F (37.3 C)] 98.3 F (36.8 C) (06/20 0409) Pulse Rate:  [66-86] 83 (06/20 0829) Resp:  [12-20] 16 (06/20 0829) BP: (130-166)/(75-100) 156/88 mmHg (06/20 0829) SpO2:  [100 %] 100 % (06/20 0829) FiO2 (%):  [40 %] 40 % (06/20 0829) Weight:  [53.1 kg (117 lb 1 oz)] 53.1 kg (117 lb 1 oz) (06/20 0400)  Intake/Output from previous day: 06/19 0701 - 06/20 0700 In: 3090 [I.V.:240; Blood:660; NG/GT:2140; IV Piggyback:50] Out: 2350 [Urine:2350] Intake/Output this shift:   Nutritional status:    Neurologic Exam:  Mental Status: Alert,intubated and not breathing over vent. Follows no commands but will look toward voice. . Cranial Nerves: II:no blink to threat, pupils unequal right 2 mm and left 3 mm , round, reactive to light and accommodation III,IV, VI: ptosis not present, extra-ocular motions intact bilaterally V,VII: face symmetric, corneal's intact VIII: hearing normal bilaterally IX,X: unable to visualize XI: bilateral shoulder shrug XII: unable to visualize  Motor: withdrawals all 4 extremities from pain Sensory: Pinprick and light touch intact throughout, bilaterally Deep Tendon Reflexes:  Right: Upper Extremity   Left: Upper extremity   biceps (C-5 to C-6) 2/4   biceps (C-5 to C-6) 2/4 tricep (C7) 2/4    triceps (C7) 2/4 Brachioradialis (C6) 2/4  Brachioradialis (C6) 2/4  Lower Extremity Lower Extremity  quadriceps (L-2 to L-4) 2/4   quadriceps (L-2 to L-4) 2/4 Achilles (S1) 2/4   Achilles (S1) 2/4  Plantars: Right: downgoing   Left: downgoing    Lab Results: Basic Metabolic Panel:  Recent Labs Lab 03/03/15 0420  03/04/15 0420 03/05/15 0508 03/06/15 0422 03/07/15 0430  NA 148* 150* 146* 146* 143  K 3.4* 4.7 4.3 4.1 3.8  CL 113* 120* 116* 117* 114*  CO2 21* 22 21* 22 21*  GLUCOSE 139* 143* 144* 130* 122*  BUN 105* 105* 100* 92* 85*  CREATININE 5.06* 4.63* 4.26* 3.99* 3.89*  CALCIUM 8.1* 8.3* 8.3* 8.3* 8.0*  MG  --  1.9  --   --   --   PHOS  --  2.5  --   --   --     Liver Function Tests:  Recent Labs Lab 03/02/15 0357 03/03/15 0420  AST 28 30  ALT 17 22  ALKPHOS 55 74  BILITOT 0.8 0.9  PROT 4.9* 5.7*  ALBUMIN 1.7* 1.9*   No results for input(s): LIPASE, AMYLASE in the last 168 hours. No results for input(s): AMMONIA in the last 168 hours.  CBC:  Recent Labs Lab 03/03/15 0420 03/04/15 0420 03/05/15 0508 03/06/15 0422 03/06/15 1420 03/07/15 0430  WBC 9.3 8.2 10.2 12.5*  --  14.7*  HGB 8.1* 7.0* 7.1* 6.7* 9.3* 9.0*  HCT 24.8* 21.8* 22.1* 21.2* 28.7* 27.7*  MCV 87.3 89.3 89.8 91.4  --  88.5  PLT 194 171 203 222  --  254    Cardiac Enzymes: No results for input(s): CKTOTAL, CKMB, CKMBINDEX, TROPONINI in the last 168 hours.  Lipid Panel: No results for input(s): CHOL, TRIG, HDL, CHOLHDL, VLDL, LDLCALC in the last 168 hours.  CBG:  Recent Labs Lab  03/06/15 0012 03/06/15 0807 03/06/15 1613 03/07/15 0035 03/07/15 0754  GLUCAP 110* 125* 113* 112* 126*    Microbiology: Results for orders placed or performed during the hospital encounter of 02/26/15  MRSA PCR Screening     Status: None   Collection Time: 02/28/15  9:35 AM  Result Value Ref Range Status   MRSA by PCR NEGATIVE NEGATIVE Final    Comment:        The GeneXpert MRSA Assay (FDA approved for NASAL specimens only), is one component of a comprehensive MRSA colonization surveillance program. It is not intended to diagnose MRSA infection nor to guide or monitor treatment for MRSA infections.   Culture, respiratory (NON-Expectorated)     Status: None   Collection Time: 03/01/15  9:55 AM  Result  Value Ref Range Status   Specimen Description TRACHEAL ASPIRATE  Final   Special Requests NONE  Final   Gram Stain   Final    ABUNDANT WBC PRESENT,BOTH PMN AND MONONUCLEAR RARE SQUAMOUS EPITHELIAL CELLS PRESENT MODERATE GRAM VARIABLE ROD FEW GRAM POSITIVE COCCI IN PAIRS FEW GRAM NEGATIVE COCCI    Culture   Final    MODERATE ENTEROBACTER CLOACAE MODERATE KLEBSIELLA PNEUMONIAE Performed at [facility]    Report Status 03/04/2015 FINAL  Final   Organism ID, Bacteria ENTEROBACTER CLOACAE  Final   Organism ID, Bacteria KLEBSIELLA PNEUMONIAE  Final      Susceptibility   Enterobacter cloacae - MIC*    CEFAZOLIN >=64 RESISTANT Resistant     CEFEPIME <=1 SENSITIVE Sensitive     CEFTAZIDIME <=1 SENSITIVE Sensitive     CEFTRIAXONE <=1 SENSITIVE Sensitive     CIPROFLOXACIN <=0.25 SENSITIVE Sensitive     GENTAMICIN <=1 SENSITIVE Sensitive     IMIPENEM <=0.25 SENSITIVE Sensitive     PIP/TAZO <=4 SENSITIVE Sensitive     TOBRAMYCIN <=1 SENSITIVE Sensitive     TRIMETH/SULFA <=20 SENSITIVE Sensitive     * MODERATE ENTEROBACTER CLOACAE   Klebsiella pneumoniae - MIC*    AMPICILLIN >=32 RESISTANT Resistant     AMPICILLIN/SULBACTAM 8 SENSITIVE Sensitive     CEFAZOLIN <=4 SENSITIVE Sensitive     CEFEPIME <=1 SENSITIVE Sensitive     CEFTAZIDIME <=1 SENSITIVE Sensitive     CEFTRIAXONE <=1 SENSITIVE Sensitive     CIPROFLOXACIN <=0.25 SENSITIVE Sensitive     GENTAMICIN <=1 SENSITIVE Sensitive     IMIPENEM <=0.25 SENSITIVE Sensitive     PIP/TAZO <=4 SENSITIVE Sensitive     TOBRAMYCIN <=1 SENSITIVE Sensitive     TRIMETH/SULFA <=20 SENSITIVE Sensitive     * MODERATE KLEBSIELLA PNEUMONIAE    Coagulation Studies: No results for input(s): LABPROT, INR in the last 72 hours.  Imaging: Dg Chest Port 1 View  03/07/2015   CLINICAL DATA:  Acute respiratory failure.  Hypertension.  EXAM: PORTABLE CHEST - 1 VIEW  COMPARISON:  03/06/2015.  FINDINGS: Endotracheal to, feeding tube, right PICC line  in stable position. Mediastinum hilar structures are stable. Stable cardiomegaly. Bibasilar atelectasis and/or infiltrates. No pneumothorax.  IMPRESSION: 1. Lines and tubes in stable position. 2. Stable cardiomegaly. 3. Bibasilar atelectasis and/or infiltrates unchanged.   Electronically Signed   By: Maisie Fus  Register   On: 03/07/2015 07:37   Dg Chest Port 1 View  03/06/2015   CLINICAL DATA:  Acute respiratory failure  EXAM: PORTABLE CHEST - 1 VIEW  COMPARISON:  03/05/2015  FINDINGS: Endotracheal tube in good position. NG tube enters the stomach. Right subclavian catheter tip in the SVC. No pneumothorax  Improved lung volume. Improvement in bibasilar airspace disease compared with the prior study. Small pleural effusions. Cardiac enlargement with improvement in vascular congestion.  IMPRESSION: Improved lung volume and improved aeration in the lung bases. Decreased vascular congestion. Support lines in good position.   Electronically Signed   By: Marlan Palau M.D.   On: 03/06/2015 08:42    Medications:  Scheduled: . sodium chloride   Intravenous Once  . sodium chloride   Intravenous Once  . amLODipine  10 mg Per Tube Daily  . antiseptic oral rinse  7 mL Mouth Rinse QID  . cefTRIAXone (ROCEPHIN)  IV  1 g Intravenous Q24H  . chlorhexidine  15 mL Mouth Rinse BID  . famotidine  20 mg Per Tube QHS  . free water  300 mL Per Tube Q4H  . heparin subcutaneous  5,000 Units Subcutaneous 3 times per day  . labetalol  300 mg Per Tube TID    Assessment/Plan:  64 yo M with extensive edema restricted solely to the white matter in both the bilateral hemispheres as well as posterior fossa. 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  As noted prior, there has been reported events of leukoencephalopathy with levamisole(a common cocaine adulterant)-- treatment for this would be supportive.  Will reorder MRI brain to evaluate  evolution of leukoencephalopathy versus PRES or worsening ischemia. Will make further recommendations after MRI.    Felicie Morn PA-C [facility] [phone]  03/07/2015, 9:58 AM Patient seen and examined together with physician assistant and I concur with the assessment and plan.  Wyatt Portela, MD

## 2015-03-07 NOTE — Progress Notes (Signed)
Nutrition Follow-up  DOCUMENTATION CODES:  Underweight, Severe malnutrition in context of chronic illness  INTERVENTION:   Continue Vital AF 1.2 via OGT at 50 ml/h (1200 ml/day) to provide 1440 kcals, 90 gm protein, 973 ml free water daily.   NUTRITION DIAGNOSIS:  Malnutrition related to chronic illness, inability to eat as evidenced by estimated needs, NPO status, severe depletion of muscle mass, severe depletion of body fat.  Ongoing   GOAL:  Patient will meet greater than or equal to 90% of their needs  Met   MONITOR:  Vent status, TF tolerance, Weight trends  REASON FOR ASSESSMENT:  Consult Enteral/tube feeding initiation and management  ASSESSMENT:  Patient admitted on 6/11 with AMS, found unresponsive, HTN crisis, and SAH. MRI head showed brain stem edema, micro bleeds.  Discussed patient in ICU rounds and with RN today. May need trach per physician.   Patient is currently receiving Vital AF 1.2 via OGT at 50 ml/h (1200 ml/day) to provide 1440 kcals, 90 gm protein, 973 ml free water daily.   Patient is currently intubated on ventilator support MV: 8.4 L/min Temp (24hrs), Avg:98.8 F (37.1 C), Min:98.3 F (36.8 C), Max:99.2 F (37.3 C)  Propofol: none  Height:  Ht Readings from Last 1 Encounters:  02/26/15 5' 8"  (1.727 m)    Weight:  Wt Readings from Last 1 Encounters:  03/07/15 117 lb 1 oz (53.1 kg)    Ideal Body Weight:  70 kg  Wt Readings from Last 10 Encounters:  03/07/15 117 lb 1 oz (53.1 kg)  02/08/15 115 lb (52.164 kg)  01/29/15 115 lb 11.2 oz (52.481 kg)    BMI:  Body mass index is 17.8 kg/(m^2).  Estimated Nutritional Needs:  Kcal:  1526  Protein:  75-95 gm  Fluid:  1.6-1.8 L  Skin:  Reviewed, no issues  Diet Order:   NPO  EDUCATION NEEDS:  No education needs identified at this time   Intake/Output Summary (Last 24 hours) at 03/07/15 1502 Last data filed at 03/07/15 1200  Gross per 24 hour  Intake   2520 ml   Output   2250 ml  Net    270 ml    Last BM:  6/18   Molli Barrows, RD, LDN, [place] Pager [phone] After Hours Pager [phone]

## 2015-03-07 NOTE — Progress Notes (Signed)
PULMONARY / CRITICAL CARE MEDICINE   Name: Lee Swanson MRN: [id number] DOB: [birth year]    ADMISSION DATE:  02/26/2015    INITIAL PRESENTATION:  47  Male with hx of substance abuse, medical non compliance, HTN. CRD, suicidal  Ideation who was found down for unknown time, breathing, extremely hypertensive 250/155 and was transported to [facility] ED. CT scan with small SDH, started on nipride, intubated. NS consulted and MRI ordered. He was cocaine + on admit. PCCM admitted.  MAJOR EVENTS/TEST RESULTS: 6/11 admitted as above. 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 The differential diagnosis does include toxic demyelination, but that is felt less likely 6/12 renal US: Echogenic renal parenchyma bilaterally, compatible with medical renal disease. No hydronephrosis 6/12 Neurology consultation: PRES vs toxic leukoencephalopathy  6/14 More responsive. Fever, copious purulent secretions. Resp culture and empiric abx initiated. Precedex initiated 6/15 Tolerates PS 5-10 cm H2O. Poor mentation and copious ET secretions prohibited extubation 6/16 Failed extubation almost immediately due to poor handling of oropharyngeal secretions 6/17 Tolerates PS 5-10 cm H2O. + F/C  INDWELLING DEVICES: L femoral A-line 6/11 >> 6/14 ETT 6/11 >> 6/16>>>6/16>>> R [place] CVL 6/11 >>   MICRO DATA: MRSA PCR 6/11 >> NEG Resp 6/14 >> Klebsiella and enterobacter  ANTIMICROBIALS:  Vanc 6/14 >> 6/17 Ceftriaxone 6/14 >>  SUBJECTIVE:  No change in neurostatus  VITAL SIGNS: Temp:  [97.2 F (36.2 C)-99.2 F (37.3 C)] 98.3 F (36.8 C) (06/20 0409) Pulse Rate:  [66-86] 83 (06/20 0829) Resp:  [12-20] 16  (06/20 0829) BP: (130-166)/(75-100) 156/88 mmHg (06/20 0829) SpO2:  [100 %] 100 % (06/20 0829) FiO2 (%):  [40 %] 40 % (06/20 0829) Weight:  [53.1 kg (117 lb 1 oz)] 53.1 kg (117 lb 1 oz) (06/20 0400) HEMODYNAMICS:   VENTILATOR SETTINGS: Vent Mode:  [-] PRVC FiO2 (%):  [40 %] 40 % Set Rate:  [16 bmp] 16 bmp Vt Set:  [500 mL] 500 mL PEEP:  [5 cmH20] 5 cmH20 INTAKE / OUTPUT:  Intake/Output Summary (Last 24 hours) at 03/07/15 0852 Last data filed at 03/07/15 0700  Gross per 24 hour  Intake   3030 ml  Output   2350 ml  Net    680 ml    PHYSICAL EXAMINATION: General: Cachectic, RASS -2 Neuro:RASS -2, not following commands HEENT: temporal wasting, NAD Cardiovascular: Reg, no M Lungs: coarse rt base Abdomen: Soft, nondistended, +BS Ext: no edema  LABS:  CBC  Recent Labs Lab 03/05/15 0508 03/06/15 0422 03/06/15 1420 03/07/15 0430  WBC 10.2 12.5*  --  14.7*  HGB 7.1* 6.7* 9.3* 9.0*  HCT 22.1* 21.2* 28.7* 27.7*  PLT 203 222  --  254   Coag's No results for input(s): APTT, INR in the last 168 hours. BMET  Recent Labs Lab 03/05/15 0508 03/06/15 0422 03/07/15 0430  NA 146* 146* 143  K 4.3 4.1 3.8  CL 116* 117* 114*  CO2 21* 22 21*  BUN 100* 92* 85*  CREATININE 4.26* 3.99* 3.89*  GLUCOSE 144* 130* 122*   Electrolytes  Recent Labs Lab 03/04/15 0420 03/05/15 0508 03/06/15 0422 03/07/15 0430  CALCIUM 8.3* 8.3* 8.3* 8.0*  MG 1.9  --   --   --   PHOS 2.5  --   --   --  Sepsis Markers No results for input(s): LATICACIDVEN, PROCALCITON, O2SATVEN in the last 168 hours. ABG No results for input(s): PHART, PCO2ART, PO2ART in the last 168 hours. Liver Enzymes  Recent Labs Lab 03/02/15 0357 03/03/15 0420  AST 28 30  ALT 17 22  ALKPHOS 55 74  BILITOT 0.8 0.9  ALBUMIN 1.7* 1.9*   Cardiac Enzymes No results for input(s): TROPONINI, PROBNP in the last 168 hours. Glucose  Recent Labs Lab 03/05/15 1616 03/06/15 0012 03/06/15 0807 03/06/15 1613  03/07/15 0035 03/07/15 0754  GLUCAP 120* 110* 125* 113* 112* 126*    CXR: rt base infiltrate unchanged  ASSESSMENT / PLAN:  PULMONARY A: Acute VDRF due to AMS PNA Rt base VAP? Aspiration? 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 Note, no family has made contact since his admission   Ccm time 30 min   Mcarthur Rossetti. Tyson Alias, MD, FACP Pgr: [phone] [facility]

## 2015-03-07 NOTE — Progress Notes (Signed)
NCM contacted Sherral Hammers, emergency contact # [phone]. Left message on voicemail. Isidoro Donning RN CCM Case Mgmt phone [phone]

## 2015-03-08 ENCOUNTER — Inpatient Hospital Stay (HOSPITAL_COMMUNITY): Payer: Medicaid Other

## 2015-03-08 DIAGNOSIS — R400 Somnolence: Secondary | ICD-10-CM

## 2015-03-08 DIAGNOSIS — I6340 Cerebral infarction due to embolism of unspecified cerebral artery: Secondary | ICD-10-CM | POA: Diagnosis present

## 2015-03-08 LAB — GLUCOSE, CAPILLARY
GLUCOSE-CAPILLARY: 89 mg/dL (ref 65–99)
Glucose-Capillary: 104 mg/dL — ABNORMAL HIGH (ref 65–99)
Glucose-Capillary: 98 mg/dL (ref 65–99)
Glucose-Capillary: 99 mg/dL (ref 65–99)

## 2015-03-08 LAB — APTT: aPTT: 33 seconds (ref 24–37)

## 2015-03-08 LAB — PROTIME-INR
INR: 1.03 (ref 0.00–1.49)
PROTHROMBIN TIME: 13.7 s (ref 11.6–15.2)

## 2015-03-08 MED ORDER — MIDAZOLAM HCL 2 MG/2ML IJ SOLN
4.0000 mg | Freq: Once | INTRAMUSCULAR | Status: AC
  Administered 2015-03-09: 4 mg via INTRAVENOUS
  Filled 2015-03-08: qty 4

## 2015-03-08 MED ORDER — ETOMIDATE 2 MG/ML IV SOLN
40.0000 mg | Freq: Once | INTRAVENOUS | Status: DC
  Filled 2015-03-08: qty 20

## 2015-03-08 MED ORDER — MIDAZOLAM HCL 2 MG/2ML IJ SOLN
INTRAMUSCULAR | Status: AC
Start: 2015-03-08 — End: 2015-03-09
  Filled 2015-03-08: qty 2

## 2015-03-08 MED ORDER — FENTANYL CITRATE (PF) 100 MCG/2ML IJ SOLN
200.0000 ug | Freq: Once | INTRAMUSCULAR | Status: AC
Start: 2015-03-09 — End: 2015-03-09
  Administered 2015-03-09: 200 ug via INTRAVENOUS
  Filled 2015-03-08: qty 4

## 2015-03-08 MED ORDER — HYDRALAZINE HCL 25 MG PO TABS
25.0000 mg | ORAL_TABLET | Freq: Three times a day (TID) | ORAL | Status: DC
  Administered 2015-03-08 – 2015-03-11 (×9): 25 mg via ORAL
  Filled 2015-03-08 (×13): qty 1

## 2015-03-08 MED ORDER — FENTANYL CITRATE (PF) 100 MCG/2ML IJ SOLN: 200.0000 ug | Freq: Once | INTRAMUSCULAR | Status: DC

## 2015-03-08 MED ORDER — FENTANYL CITRATE (PF) 100 MCG/2ML IJ SOLN
100.0000 ug | Freq: Once | INTRAMUSCULAR | Status: AC
  Administered 2015-03-08: 100 ug via INTRAVENOUS

## 2015-03-08 MED ORDER — MIDAZOLAM HCL 2 MG/2ML IJ SOLN
5.0000 mg | Freq: Once | INTRAMUSCULAR | Status: AC
  Administered 2015-03-07: 4 mg via INTRAVENOUS

## 2015-03-08 MED ORDER — SODIUM CHLORIDE 0.9 % IV SOLN
INTRAVENOUS | Status: DC
  Administered 2015-03-10: 17:00:00 via INTRAVENOUS

## 2015-03-08 MED ORDER — PROPOFOL 500 MG/50ML IV EMUL
5.0000 ug/kg/min | Freq: Once | INTRAVENOUS | Status: DC
  Filled 2015-03-08: qty 50

## 2015-03-08 MED ORDER — VECURONIUM BROMIDE 10 MG IV SOLR
10.0000 mg | Freq: Once | INTRAVENOUS | Status: AC
  Administered 2015-03-09: 10 mg via INTRAVENOUS
  Filled 2015-03-08: qty 10

## 2015-03-08 MED ORDER — MIDAZOLAM HCL 2 MG/2ML IJ SOLN: 4.0000 mg | Freq: Once | INTRAMUSCULAR | Status: DC

## 2015-03-08 MED ORDER — ETOMIDATE 2 MG/ML IV SOLN: 40.0000 mg | Freq: Once | INTRAVENOUS | Status: DC

## 2015-03-08 MED ORDER — PROPOFOL 500 MG/50ML IV EMUL
5.0000 ug/kg/min | Freq: Once | INTRAVENOUS | Status: AC
  Administered 2015-03-09: 30 ug/kg/min via INTRAVENOUS
  Filled 2015-03-08: qty 50

## 2015-03-08 MED ORDER — VECURONIUM BROMIDE 10 MG IV SOLR: 10.0000 mg | Freq: Once | INTRAVENOUS | Status: DC

## 2015-03-08 MED ORDER — MIDAZOLAM HCL 2 MG/2ML IJ SOLN
5.0000 mg | Freq: Once | INTRAMUSCULAR | Status: AC
  Administered 2015-03-08: 5 mg via INTRAVENOUS

## 2015-03-08 NOTE — Interval H&P Note (Signed)
History and Physical Interval Note:  03/08/2015 4:50 PM  Lee Swanson  has presented today for surgery, with the diagnosis of * No surgery found *  The various methods of treatment have been discussed with the patient and family. After consideration of risks, benefits and other options for treatment, the patient has consented to  * No surgery found * as a surgical intervention .  The patient's history has been reviewed, patient examined, no change in status, stable for surgery.  I have reviewed the patient's chart and labs.  Questions were answered to the patient's satisfaction.     Sherian Valenza [employer]

## 2015-03-08 NOTE — Progress Notes (Addendum)
PULMONARY / CRITICAL CARE MEDICINE   Name: Lee Swanson MRN: [id number] DOB: 05/12/1951    ADMISSION DATE:  02/26/2015    INITIAL PRESENTATION:  63  Male with hx of substance abuse, medical non compliance, HTN. CRD, suicidal  Ideation who was found down for unknown time, breathing, extremely hypertensive 250/155 and was transported to [place]. CT scan with small SDH, started on nipride, intubated. NS consulted and MRI ordered. He was cocaine + on admit. PCCM admitted.  MAJOR EVENTS/TEST RESULTS: 6/11 admitted as above. 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 The differential diagnosis does include toxic demyelination, but that is felt less likely 6/12 renal US: Echogenic renal parenchyma bilaterally, compatible with medical renal disease. No hydronephrosis 6/12 Neurology consultation: PRES vs toxic leukoencephalopathy  6/14 More responsive. Fever, copious purulent secretions. Resp culture and empiric abx initiated. Precedex initiated 6/15 Tolerates PS 5-10 cm H2O. Poor mentation and copious ET secretions prohibited extubation 6/16 Failed extubation almost immediately due to poor handling of oropharyngeal secretions 6/17 Tolerates PS 5-10 cm H2O. + F/C 6/19 MRI brain repeat>>>Improved pres, Progressive infarcts with new infarcts noted involving portions ofthe posterior right frontal lobe, left periatrial region, right thalamus and right cerebellum with persistent acute/ subacute infarcts involving the left periatrial region, left thalamus and left cerebellum. Tiny amount hemorrhage associated with the left cerebellar infarct not entirely excluded.  INDWELLING DEVICES: L  femoral A-line 6/11 >> 6/14 ETT 6/11 >> 6/16>>>6/16>>> R [place] CVL 6/11 >>   MICRO DATA: MRSA PCR 6/11 >> NEG Resp 6/14 >> Klebsiella and enterobacter  ANTIMICROBIALS:  Vanc 6/14 >> 6/17 Ceftriaxone 6/14 >>  SUBJECTIVE:  Follows commands, weak left  VITAL SIGNS: Temp:  [97.5 F (36.4 C)-98.8 F (37.1 C)] 98.8 F (37.1 C) (06/21 0819) Pulse Rate:  [62-81] 66 (06/21 0700) Resp:  [15-20] 17 (06/21 0700) BP: (119-163)/(78-128) 155/128 mmHg (06/21 0835) SpO2:  [100 %] 100 % (06/21 0700) FiO2 (%):  [40 %] 40 % (06/21 0835) Weight:  [53 kg (116 lb 13.5 oz)] 53 kg (116 lb 13.5 oz) (06/21 0400) HEMODYNAMICS:   VENTILATOR SETTINGS: Vent Mode:  [-] PSV;CPAP FiO2 (%):  [40 %] 40 % Set Rate:  [16 bmp] 16 bmp Vt Set:  [500 mL] 500 mL PEEP:  [5 cmH20] 5 cmH20 Pressure Support:  [5 cmH20-10 cmH20] 5 cmH20 Plateau Pressure:  [7 cmH20-13 cmH20] 13 cmH20 INTAKE / OUTPUT:  Intake/Output Summary (Last 24 hours) at 03/08/15 0851 Last data filed at 03/08/15 0800  Gross per 24 hour  Intake   3130 ml  Output   2375 ml  Net    755 ml    PHYSICAL EXAMINATION: General: Cachectic, RASS -1 Neuro:RASS -2, following commands, weak left upper HEENT: temporal wasting, NAD Cardiovascular: Reg, no M Lungs: CTA Abdomen: Soft, nondistended, +BS Ext: no edema  LABS:  CBC  Recent Labs Lab 03/05/15 0508 03/06/15 0422 03/06/15 1420 03/07/15 0430  WBC 10.2 12.5*  --  14.7*  HGB 7.1* 6.7* 9.3* 9.0*  HCT 22.1* 21.2* 28.7* 27.7*  PLT 203 222  --  254   Coag's No results for input(s): APTT, INR in the last 168 hours. BMET  Recent Labs Lab 03/05/15 0508 03/06/15 0422 03/07/15 0430  NA 146* 146*   143  K 4.3 4.1 3.8  CL 116* 117* 114*  CO2 21* 22 21*  BUN 100* 92* 85*  CREATININE 4.26* 3.99* 3.89*  GLUCOSE 144* 130* 122*   Electrolytes  Recent Labs Lab 03/04/15 0420 03/05/15 0508 03/06/15 0422 03/07/15 0430  CALCIUM 8.3* 8.3* 8.3* 8.0*  MG 1.9  --   --   --   PHOS 2.5  --    --   --    Sepsis Markers No results for input(s): LATICACIDVEN, PROCALCITON, O2SATVEN in the last 168 hours. ABG No results for input(s): PHART, PCO2ART, PO2ART in the last 168 hours. Liver Enzymes  Recent Labs Lab 03/02/15 0357 03/03/15 0420  AST 28 30  ALT 17 22  ALKPHOS 55 74  BILITOT 0.8 0.9  ALBUMIN 1.7* 1.9*   Cardiac Enzymes No results for input(s): TROPONINI, PROBNP in the last 168 hours. Glucose  Recent Labs Lab 03/06/15 0807 03/06/15 1613 03/07/15 0035 03/07/15 0754 03/07/15 1552 03/08/15 0015  GLUCAP 125* 113* 112* 126* 80 99    CXR: rt base infiltrate unchanged  ASSESSMENT / PLAN:  PULMONARY A: Acute VDRF due to AMS PNA Rt base VAP? Aspiration? 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 Note, no family has made contact since his admission  As his treating physician he needs tracheostomy to continued aggressive are. Risks are outweighed by benefit   Ccm time 30 min   Aleem Elza J. Mattalynn Crandle, MD, FACP Pgr: [phone] [facility]   Update: pt improved neuro, Alert, appropriate, I consented him for trach,. He understood al risks benefits. Forcha , RN also present 

## 2015-03-08 NOTE — Progress Notes (Signed)
STROKE TEAM PROGRESS NOTE   HISTORY Lee Swanson is an 64 y.o. male with a history of medical noncompliance, hypertension, tobacco use, cocaine use, alcohol use, chronic kidney disease, suicidal ideations, hepatitis C, Barrett's esophagus, and thrombocytopenia, brought to [facility] 02/26/2015 after he was found down for an unknown period of time. He was noted to have a blood pressure of 250/155. he was placed on a Cardene drip and intubated.  A UDS was positive for cocaine. An MRI was consistent with posterior reversible encephalopathy syndrome with profound edema throughout the brain stem and an acute left subarachnoid hemorrhage. A CT scan of the head showed a small left subdural hematoma. Neurosurgery was consulted although surgery was not felt to be indicated. Neurology has been following until today MRI showed increasing strokes. He was transferred to the stroke team 03/08/2015 for continued followup.    SUBJECTIVE (INTERVAL HISTORY) His attending is at the bedside.  Plans are for trach tomorrow.   OBJECTIVE Temp:  [97.5 F (36.4 C)-98.8 F (37.1 C)] 98.8 F (37.1 C) (06/21 0819) Pulse Rate:  [62-78] 66 (06/21 0700) Cardiac Rhythm:  [-] Normal sinus rhythm (06/21 0800) Resp:  [15-20] 17 (06/21 0700) BP: (119-163)/(78-128) 155/128 mmHg (06/21 0835) SpO2:  [100 %] 100 % (06/21 0700) FiO2 (%):  [40 %] 40 % (06/21 0835) Weight:  [53 kg (116 lb 13.5 oz)] 53 kg (116 lb 13.5 oz) (06/21 0400)   Recent Labs Lab 03/07/15 0035 03/07/15 0754 03/07/15 1552 03/08/15 0015 03/08/15 0814  GLUCAP 112* 126* 80 99 98    Recent Labs Lab 03/03/15 0420 03/04/15 0420 03/05/15 0508 03/06/15 0422 03/07/15 0430  NA 148* 150* 146* 146* 143  K 3.4* 4.7 4.3 4.1 3.8  CL 113* 120* 116* 117* 114*  CO2 21* 22 21* 22 21*  GLUCOSE 139* 143* 144* 130* 122*  BUN 105* 105* 100* 92* 85*  CREATININE 5.06* 4.63* 4.26* 3.99* 3.89*  CALCIUM 8.1* 8.3* 8.3* 8.3* 8.0*  MG  --  1.9  --   --   --    PHOS  --  2.5  --   --   --     Recent Labs Lab 03/02/15 0357 03/03/15 0420  AST 28 30  ALT 17 22  ALKPHOS 55 74  BILITOT 0.8 0.9  PROT 4.9* 5.7*  ALBUMIN 1.7* 1.9*    Recent Labs Lab 03/03/15 0420 03/04/15 0420 03/05/15 0508 03/06/15 0422 03/06/15 1420 03/07/15 0430  WBC 9.3 8.2 10.2 12.5*  --  14.7*  HGB 8.1* 7.0* 7.1* 6.7* 9.3* 9.0*  HCT 24.8* 21.8* 22.1* 21.2* 28.7* 27.7*  MCV 87.3 89.3 89.8 91.4  --  88.5  PLT 194 171 203 222  --  254   No results for input(s): CKTOTAL, CKMB, CKMBINDEX, TROPONINI in the last 168 hours.  Recent Labs  03/08/15 0935  LABPROT 13.7  INR 1.03   No results for input(s): COLORURINE, LABSPEC, PHURINE, GLUCOSEU, HGBUR, BILIRUBINUR, KETONESUR, PROTEINUR, UROBILINOGEN, NITRITE, LEUKOCYTESUR in the last 72 hours.  Invalid input(s): APPERANCEUR     Component Value Date/Time   CHOL 148 01/25/2015 0346   TRIG 49 01/25/2015 0346   HDL 62 01/25/2015 0346   CHOLHDL 2.4 01/25/2015 0346   VLDL 10 01/25/2015 0346   LDLCALC 76 01/25/2015 0346   No results found for: HGBA1C    Component Value Date/Time   LABOPIA NONE DETECTED 02/26/2015 0800   COCAINSCRNUR POSITIVE* 02/26/2015 0800   LABBENZ NONE DETECTED 02/26/2015 0800   AMPHETMU  NONE DETECTED 02/26/2015 0800   THCU NONE DETECTED 02/26/2015 0800   LABBARB Negative 02/28/2015 0920   LABBARB NONE DETECTED 02/26/2015 0800    No results for input(s): ETH in the last 168 hours.  Mr Brain Wo Contrast  03/07/2015   CLINICAL DATA:  64 year old hypertensive male with chronic kidney disease and thrombocytopenia with recent abnormal MR. Found unresponsive. Episode of severe hypertension. Subsequent encounter.  EXAM: MRI HEAD WITHOUT CONTRAST  TECHNIQUE: Multiplanar, multiecho pulse sequences of the brain and surrounding structures were obtained without intravenous contrast.  COMPARISON:  02/26/2015 MR.  FINDINGS: Exam is motion degraded.  Decrease in degree of edema throughout the brainstem,  cerebellum and basal ganglia. The fact that there is a transient component of white matter changes suggests that the edema may be reflective of posterior reversible encephalopathy syndrome. There is however, evidence of persistent significant white matter type changes, some of which appears to reflect white matter disease (most likely from small vessel disease or possibly vasculitis in this patient with history of substance abuse) in addition to changes of posterior reversible encephalopathy syndrome.  Progressive infarcts with new infarcts noted involving portions of the posterior right frontal lobe, left periatrial region, right thalamus and right cerebellum with persistent acute/ subacute infarcts involving the left periatrial region, left thalamus and left cerebellum. Tiny amount hemorrhage associated with the left cerebellar infarct not entirely excluded.  Subarachnoid hemorrhage previous noted left perimesencephalic region has cleared. Tiny amount of deep dependent interventricular blood without change.  Remote posterior right corona radiata infarct. Remote small basal ganglia infarcts. Remote small right paracentral pontine infarct.  Scattered punctate blood breakdown products more notable involving the cerebellum suggestive of result of hemorrhagic ischemia.  Global atrophy. Ventricular prominence felt to be related to atrophy rather than hydrocephalus.  No obvious intracranial mass seen separate from above described findings.  Small left vertebral artery unchanged ending in a posterior inferior cerebellar artery distribution. Major intracranial vascular structures are patent.  C3-4 bulge with mild cord flattening.  Small pituitary gland without expansion of the sella. Slightly prominent peri optic spaces exophthalmos. Pineal region unremarkable and cervical medullary junction within normal limits.  IMPRESSION: Exam is motion degraded.  Interval partial clearing of changes which I suspect were related to  posterior reversible encephalopathy syndrome as detailed above. Follow-up interval stabilization may be considered.  Progressive infarcts with new infarcts noted involving portions of the posterior right frontal lobe, left periatrial region, right thalamus and right cerebellum with persistent acute/ subacute infarcts involving the left periatrial region, left thalamus and left cerebellum. Tiny amount hemorrhage associated with the left cerebellar infarct not entirely excluded.  Subarachnoid hemorrhage previous noted left perimesencephalic region has cleared. Tiny amount of deep dependent interventricular blood without change.  Remote posterior right corona radiata infarct. Remote small basal ganglia infarcts. Remote small right paracentral pontine infarct.  Scattered punctate blood breakdown products more notable involving the cerebellum suggestive of result of hemorrhagic ischemia.  Global atrophy. Ventricular prominence felt to be related to atrophy rather than hydrocephalus.  C3-4 bulge with mild cord flattening.   Electronically Signed   By: Lacy Duverney M.D.   On: 03/07/2015 13:00   Dg Chest Port 1 View  03/07/2015   CLINICAL DATA:  Acute respiratory failure.  Hypertension.  EXAM: PORTABLE CHEST - 1 VIEW  COMPARISON:  03/06/2015.  FINDINGS: Endotracheal to, feeding tube, right PICC line in stable position. Mediastinum hilar structures are stable. Stable cardiomegaly. Bibasilar atelectasis and/or infiltrates. No pneumothorax.  IMPRESSION: 1.  Lines and tubes in stable position. 2. Stable cardiomegaly. 3. Bibasilar atelectasis and/or infiltrates unchanged.   Electronically Signed   By: Maisie Fus  Register   On: 03/07/2015 07:37     PHYSICAL EXAM Frail malnourished looking middle-aged African-American male who is intubated. . Afebrile. Head is nontraumatic. Neck is supple without bruit.    Cardiac exam no murmur or gallop. Lungs are clear to auscultation. Distal pulses are well felt.  Neurological Exam :  Awake alert and follows commands well. Pupils 3 mm irregular sluggishly reactive. Extraocular movements are full range. Blinks to threat bilaterally. Fundi were not visualized. Vision acuity cannot be reliably tested. Mild left lower facial weakness. Tongue midline. Good cough and gag. Left hemiparesis with 3/5 strength. Able to move right upper and lower extremity well against gravity without weakness. Subjective diminished touch pinprick sensation on the left. Deep tendon pulses are depressed on the left and normal on the right. Left plantar upgoing right is equivocal. Gait was not tested.  ASSESSMENT/PLAN Mr. Lee Swanson is a 64 y.o. male with history of medical noncompliance, hypertension, tobacco use, cocaine use, alcohol use, chronic kidney disease, suicidal ideations, hepatitis C, Barrett's esophagus, and thrombocytopenia found unresponsive. He was round to have PRES. Subsequent MRIs showed progressive ischemic infarcts. He did not receive IV t-PA due to unknown onset.   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   new infarcts (R posterior frontal lobe, L periatrial, R thalamus and R cerebellum)   acute/ subacute infarcts (L periatrial region, L thalamus and L cerebellum). SAH cleared. Tiny deep IVH stable.   old (R posterior corona radiata, small B basal ganglia, small R paracentral pontine).    Cerebellar hemorrhagic ischemia.    Global atrophy.   C3-4 bulge with mild cord flattening.     Unable to do CTA d/t elevated Cr  TCD ordered   Carotid Doppler  ordered  2D Echo  01/26/15 no source of embolus  Recommend TEE to look for embolic source. Please arrange with pts cardiologist or cardiologist of choice. If positive for PFO (patent foramen ovale), check bilateral lower extremity venous dopplers to rule out DVT as possible  source of stroke.    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 Aspiration. On abx.  Other Pertinent History  Hepatitis C  Hospital day # 10  Rhoderick Moody [facility] See Amion for Pager information 03/08/2015 12:48 PM  I have personally examined this patient, reviewed notes, independently viewed imaging studies, participated in medical decision making and plan of care. I have made any additions or clarifications directly to the above note. Agree with note above.  The patient had presented with cocaine-induced encephalopathy with multiple infarcts whose had  some recent neurological worsening and MRI scan shows increasing bi cerebral small infarcts. Possibilities include cocaine related vasculitis, small vessel disease or cardiogenic emboli. He remains at risk for neurological worsening and recurrent strokes. Plan to check TEE and CT angiogram if renal function permits. Discussed with Dr. Tyson Alias This patient is critically ill and at significant risk of neurological worsening, death and care requires constant monitoring of vital signs, hemodynamics,respiratory and cardiac monitoring, extensive review of multiple databases, frequent neurological assessment, discussion with family, other specialists and medical decision making of high complexity.I have made any additions or clarifications directly to  the above note.  I spent 30 minutes of neurocritical care time  in the care of  this patient.     Delia Heady, MD Medical Director [facility] Pager: [phone] 03/08/2015 2:17 PM    To contact Stroke Continuity provider, please refer to [email or web address]. After hours, contact General Neurology

## 2015-03-08 NOTE — Clinical Social Work Note (Signed)
Clinical Social Worker received a returned phone call from Sherral Hammers who reported that she is the patient's landlord/friend and has known the patient for about 6-7 years. CSW explained the reasoning for contacting patient's emergency contact listed. Ms. Joseph Art stated she was not aware she was listed as an emergency contact. Ms. Joseph Art further reported she does not feel comfortable making medical decision on the patient's behalf because she is of no blood relation.   Ms. Joseph Art also reported patient has a daughter who he no longer talks to due to various differences. Ms. Joseph Art does not have family contact information for pt's daughter. CSW encouraged Ms. Woods to contact 25M- MD and/or bedside RN. Patient's friend further stated she has contacted the unit for medical updates however was denied access because of no having a password.   Ms. Sherral Hammers can be reached at [phone].   CSW remains available as needed.   Derenda Fennel, MSW, LCSWA [phone] 03/08/2015 1:01 PM

## 2015-03-08 NOTE — Progress Notes (Signed)
Patient ID: Lee Swanson, male   DOB: July 25, 1951, 64 y.o.   MRN: [id number]  I attempted to do a TEE.   I was unable to pass the probe beyond the upper esophagus despite multiple attempts.  The patient was hemodynamically stable at the end of the procedure.    Will discuss with Dr Tyson Alias.   Marca Ancona 03/08/2015

## 2015-03-08 NOTE — Care Management Note (Signed)
Case Management Note  Patient Details  Name: Lee Swanson MRN: [id number] Date of Birth: 1951-03-02  Subjective/Objective:         Patient is awake, following commands.  Asked him if he had a daughter, after a pause, nods head yes to having a daughter.  Asked if he would like [place] to contact daughter, shakes head no.            Action/Plan:   Expected Discharge Date:                  Expected Discharge Plan:  Home/Self Care  In-House Referral:  Clinical Social Work  Discharge planning Services     Post Acute Care Choice:    Choice offered to:     DME Arranged:    DME Agency:     HH Arranged:    HH Agency:     Status of Service:  In process, will continue to follow  Medicare Important Message Given:    Date Medicare IM Given:    Medicare IM give by:    Date Additional Medicare IM Given:    Additional Medicare Important Message give by:     If discussed at Long Length of Stay Meetings, dates discussed:    Additional Comments:  Vangie Bicker, RN 03/08/2015, 2:04 PM

## 2015-03-08 NOTE — H&P (View-Only) (Signed)
PULMONARY / CRITICAL CARE MEDICINE   Name: Lee Swanson MRN: [id number] DOB: Feb 02, 1951    ADMISSION DATE:  02/26/2015    INITIAL PRESENTATION:  55  Male with hx of substance abuse, medical non compliance, HTN. CRD, suicidal  Ideation who was found down for unknown time, breathing, extremely hypertensive 250/155 and was transported to [facility] ED. CT scan with small SDH, started on nipride, intubated. NS consulted and MRI ordered. He was cocaine + on admit. PCCM admitted.  MAJOR EVENTS/TEST RESULTS: 6/11 admitted as above. 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 The differential diagnosis does include toxic demyelination, but that is felt less likely 6/12 renal US: Echogenic renal parenchyma bilaterally, compatible with medical renal disease. No hydronephrosis 6/12 Neurology consultation: PRES vs toxic leukoencephalopathy  6/14 More responsive. Fever, copious purulent secretions. Resp culture and empiric abx initiated. Precedex initiated 6/15 Tolerates PS 5-10 cm H2O. Poor mentation and copious ET secretions prohibited extubation 6/16 Failed extubation almost immediately due to poor handling of oropharyngeal secretions 6/17 Tolerates PS 5-10 cm H2O. + F/C 6/19 MRI brain repeat>>>Improved pres, Progressive infarcts with new infarcts noted involving portions ofthe posterior right frontal lobe, left periatrial region, right thalamus and right cerebellum with persistent acute/ subacute infarcts involving the left periatrial region, left thalamus and left cerebellum. Tiny amount hemorrhage associated with the left cerebellar infarct not entirely excluded.  INDWELLING DEVICES: L  femoral A-line 6/11 >> 6/14 ETT 6/11 >> 6/16>>>6/16>>> R [place] CVL 6/11 >>   MICRO DATA: MRSA PCR 6/11 >> NEG Resp 6/14 >> Klebsiella and enterobacter  ANTIMICROBIALS:  Vanc 6/14 >> 6/17 Ceftriaxone 6/14 >>  SUBJECTIVE:  Follows commands, weak left  VITAL SIGNS: Temp:  [97.5 F (36.4 C)-98.8 F (37.1 C)] 98.8 F (37.1 C) (06/21 0819) Pulse Rate:  [62-81] 66 (06/21 0700) Resp:  [15-20] 17 (06/21 0700) BP: (119-163)/(78-128) 155/128 mmHg (06/21 0835) SpO2:  [100 %] 100 % (06/21 0700) FiO2 (%):  [40 %] 40 % (06/21 0835) Weight:  [53 kg (116 lb 13.5 oz)] 53 kg (116 lb 13.5 oz) (06/21 0400) HEMODYNAMICS:   VENTILATOR SETTINGS: Vent Mode:  [-] PSV;CPAP FiO2 (%):  [40 %] 40 % Set Rate:  [16 bmp] 16 bmp Vt Set:  [500 mL] 500 mL PEEP:  [5 cmH20] 5 cmH20 Pressure Support:  [5 cmH20-10 cmH20] 5 cmH20 Plateau Pressure:  [7 cmH20-13 cmH20] 13 cmH20 INTAKE / OUTPUT:  Intake/Output Summary (Last 24 hours) at 03/08/15 0851 Last data filed at 03/08/15 0800  Gross per 24 hour  Intake   3130 ml  Output   2375 ml  Net    755 ml    PHYSICAL EXAMINATION: General: Cachectic, RASS -1 Neuro:RASS -2, following commands, weak left upper HEENT: temporal wasting, NAD Cardiovascular: Reg, no M Lungs: CTA Abdomen: Soft, nondistended, +BS Ext: no edema  LABS:  CBC  Recent Labs Lab 03/05/15 0508 03/06/15 0422 03/06/15 1420 03/07/15 0430  WBC 10.2 12.5*  --  14.7*  HGB 7.1* 6.7* 9.3* 9.0*  HCT 22.1* 21.2* 28.7* 27.7*  PLT 203 222  --  254   Coag's No results for input(s): APTT, INR in the last 168 hours. BMET  Recent Labs Lab 03/05/15 0508 03/06/15 0422 03/07/15 0430  NA 146* 146*  143  K 4.3 4.1 3.8  CL 116* 117* 114*  CO2 21* 22 21*  BUN 100* 92* 85*  CREATININE 4.26* 3.99* 3.89*  GLUCOSE 144* 130* 122*   Electrolytes  Recent Labs Lab 03/04/15 0420 03/05/15 0508 03/06/15 0422 03/07/15 0430  CALCIUM 8.3* 8.3* 8.3* 8.0*  MG 1.9  --   --   --   PHOS 2.5  --    --   --    Sepsis Markers No results for input(s): LATICACIDVEN, PROCALCITON, O2SATVEN in the last 168 hours. ABG No results for input(s): PHART, PCO2ART, PO2ART in the last 168 hours. Liver Enzymes  Recent Labs Lab 03/02/15 0357 03/03/15 0420  AST 28 30  ALT 17 22  ALKPHOS 55 74  BILITOT 0.8 0.9  ALBUMIN 1.7* 1.9*   Cardiac Enzymes No results for input(s): TROPONINI, PROBNP in the last 168 hours. Glucose  Recent Labs Lab 03/06/15 0807 03/06/15 1613 03/07/15 0035 03/07/15 0754 03/07/15 1552 03/08/15 0015  GLUCAP 125* 113* 112* 126* 80 99    CXR: rt base infiltrate unchanged  ASSESSMENT / PLAN:  PULMONARY A: Acute VDRF due to AMS PNA Rt base VAP? Aspiration? 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 Note, no family has made contact since his admission  As his treating physician he needs tracheostomy to continued aggressive are. Risks are outweighed by benefit   Ccm time 30 min   Mcarthur Rossetti. Tyson Alias, MD, FACP Pgr: [phone] [facility]   Update: pt improved neuro, Alert, appropriate, I consented him for trach,. He understood al risks benefits. Woody Seller , RN also present

## 2015-03-08 NOTE — Progress Notes (Signed)
Patient performed .9 L on vital capacity and -20 on the NIF. Pt gave a good effort.

## 2015-03-09 ENCOUNTER — Inpatient Hospital Stay (HOSPITAL_COMMUNITY): Payer: Medicaid Other

## 2015-03-09 ENCOUNTER — Inpatient Hospital Stay (HOSPITAL_COMMUNITY): Payer: Self-pay

## 2015-03-09 LAB — BASIC METABOLIC PANEL
ANION GAP: 8 (ref 5–15)
BUN: 78 mg/dL — ABNORMAL HIGH (ref 6–20)
CO2: 22 mmol/L (ref 22–32)
Calcium: 8.4 mg/dL — ABNORMAL LOW (ref 8.9–10.3)
Chloride: 111 mmol/L (ref 101–111)
Creatinine, Ser: 4 mg/dL — ABNORMAL HIGH (ref 0.61–1.24)
GFR calc Af Amer: 17 mL/min — ABNORMAL LOW (ref >60)
GFR, EST NON AFRICAN AMERICAN: 15 mL/min — AB (ref >60)
GLUCOSE: 92 mg/dL (ref 65–99)
POTASSIUM: 4.1 mmol/L (ref 3.5–5.1)
SODIUM: 141 mmol/L (ref 135–145)

## 2015-03-09 LAB — GLUCOSE, CAPILLARY
Glucose-Capillary: 80 mg/dL (ref 65–99)
Glucose-Capillary: 89 mg/dL (ref 65–99)
Glucose-Capillary: 91 mg/dL (ref 65–99)

## 2015-03-09 LAB — LIPID PANEL
CHOLESTEROL: 99 mg/dL (ref 0–200)
HDL: 31 mg/dL — AB (ref >40)
LDL Cholesterol: 50 mg/dL (ref 0–99)
Total CHOL/HDL Ratio: 3.2 RATIO
Triglycerides: 90 mg/dL (ref <150)
VLDL: 18 mg/dL (ref 0–40)

## 2015-03-09 MED ORDER — FENTANYL CITRATE (PF) 100 MCG/2ML IJ SOLN: 25.0000 ug | INTRAMUSCULAR | Status: DC | PRN

## 2015-03-09 MED ORDER — MIDAZOLAM HCL 2 MG/2ML IJ SOLN
INTRAMUSCULAR | Status: AC
  Administered 2015-03-09: 4 mg via INTRAVENOUS
  Filled 2015-03-09: qty 4

## 2015-03-09 MED ORDER — PROPOFOL 1000 MG/100ML IV EMUL
INTRAVENOUS | Status: AC
  Administered 2015-03-09: 30 ug/kg/min via INTRAVENOUS
  Filled 2015-03-09: qty 100

## 2015-03-09 MED ORDER — FUROSEMIDE 10 MG/ML IJ SOLN
60.0000 mg | Freq: Two times a day (BID) | INTRAMUSCULAR | Status: AC
  Administered 2015-03-09 – 2015-03-11 (×4): 60 mg via INTRAVENOUS
  Filled 2015-03-09 (×4): qty 6

## 2015-03-09 MED ORDER — FENTANYL CITRATE (PF) 100 MCG/2ML IJ SOLN
INTRAMUSCULAR | Status: AC
  Administered 2015-03-09: 200 ug via INTRAVENOUS
  Filled 2015-03-09: qty 4

## 2015-03-09 NOTE — Progress Notes (Signed)
FVC 0.9L

## 2015-03-09 NOTE — Procedures (Signed)
Bedside Tracheostomy Insertion Procedure Note   Patient Details:   Name: Lee Swanson DOB: 1950/12/11 MRN: [id number]  Procedure: Tracheostomy  Pre Procedure Assessment: ET Tube Size:8.0 ET Tube secured at lip (cm):22 Bite block in place: No Breath Sounds: Rhonch  Post Procedure Assessment: BP 131/78 mmHg  Pulse 61  Temp(Src) 98.6 F (37 C) (Oral)  Resp 16  Ht 5\' 8"  (1.727 m)  Wt 113 lb 12.1 oz (51.6 kg)  BMI 17.30 kg/m2  SpO2 100% O2 sats: stable throughout Complications: No apparent complications Patient did tolerate procedure well Tracheostomy Brand:Shiley Tracheostomy Style:Cuffed Tracheostomy Size: 6 Tracheostomy Secured IDP:[id number] Tracheostomy Placement Confirmation:Trach cuff visualized and in place and Chest X ray ordered for placement    Cherylin Mylar 03/09/2015, 12:08 PM

## 2015-03-09 NOTE — Procedures (Signed)
Name:  [name] MRN:  [id number] DOB:  03-01-1951  OPERATIVE NOTE  Procedure:  Percutaneous tracheostomy.  Indications:  Ventilator-dependent respiratory failure.  Consent:  Procedure, alternatives, risks and benefits discussed with medical POA.  Questions answered.  Consent obtained.  Anesthesia:  Versed, fent, prop, vec  Procedure summary:  Appropriate equipment was assembled.  The patient was identified as Lee Swanson and safety timeout was performed. The patient was placed in supine position with a towel roll behind shoulder blades and neck extended.  Sterile technique was used. The patient's neck and upper chest were prepped using chlorhexidine / alcohol scrub and the field was draped in usual sterile fashion with full body drape. After the adequate sedation / anesthesia was achieved, attention was directed at the midline trachea, where the cricothyroid membrane was palpated. Approximately two fingerbreadths above the sternal notch, a verticle incision was created with a scalpel after local infiltration with 0.2% Lidocaine. Then, using Seldinger technique and a percutaneous tracheostomy set, the trachea was entered with a 14 gauge needle with an overlying sheath. This was all confirmed under direct visualization of a fiberoptic flexible bronchoscope. Entrance into the trachea was identified through the third tracheal ring interspace. Following this, a guidewire was inserted. The needle was removed, leaving the sheath and the guidewire intact. Next, the sheath was removed and a small dilator was inserted. The tracheal rings were then dilated. A #8 Shiley was then opened. The balloon was checked. It was placed over a tracheal dilator, which was then advanced over the guidewire and through the previously dilated tract. The Shiley tracheostomy tube was noted to pass in the trachea with little resistance. The guidewire and dilator tubes were removed from the trachea. An inner cannula was  placed through the tracheostomy tube. The tracheostomy was then secured at the anterior neck with 4 monofilament sutures. The oral endotracheal tube was removed and the ventilator was attached to the newly placed tracheostomy tube. Adequate tidal volumes were noted. The cuff was inflated and no evidence of air leak was noted. No evidence of bleeding was noted. At this point, the procedure was concluded. Post-procedure chest x-ray was ordered.  Complications:  No immediate complications were noted.  Hemodynamic parameters and oxygenation remained stable throughout the procedure.  Estimated blood loss:  Less then 5 mL.  Nelda Bucks., MD Pulmonary and Critical Care Medicine [facility] Pager: [phone]  03/09/2015, 1:48 PM

## 2015-03-09 NOTE — Progress Notes (Addendum)
NIF-20 VC-.95 Patient performed with good effort. RT will continue to monitor.

## 2015-03-09 NOTE — Progress Notes (Signed)
STROKE TEAM PROGRESS NOTE   HISTORY Lee Swanson is an 64 y.o. male with a history of medical noncompliance, hypertension, tobacco use, cocaine use, alcohol use, chronic kidney disease, suicidal ideations, hepatitis C, Barrett's esophagus, and thrombocytopenia, brought to [facility] 02/26/2015 after he was found down for an unknown period of time. He was noted to have a blood pressure of 250/155. he was placed on a Cardene drip and intubated.  A UDS was positive for cocaine. An MRI was consistent with posterior reversible encephalopathy syndrome with profound edema throughout the brain stem and an acute left subarachnoid hemorrhage. A CT scan of the head showed a small left subdural hematoma. Neurosurgery was consulted although surgery was not felt to be indicated. Neurology has been following until today MRI showed increasing strokes. He was transferred to the stroke team 03/08/2015 for continued followup.    SUBJECTIVE (INTERVAL HISTORY) His RN is at the bedside.  Plans are for trach today. TEE unable to do as probe could not be passed safely.CTA shows no significant stenosis or occlusion.   OBJECTIVE Temp:  [98.3 F (36.8 C)-99.2 F (37.3 C)] 98.6 F (37 C) (06/22 0756) Pulse Rate:  [56-78] 56 (06/22 1300) Cardiac Rhythm:  [-] Normal sinus rhythm (06/22 0800) Resp:  [13-19] 16 (06/22 1300) BP: (112-150)/(71-92) 125/75 mmHg (06/22 1300) SpO2:  [100 %] 100 % (06/22 1300) FiO2 (%):  [40 %] 40 % (06/22 1146) Weight:  [113 lb 12.1 oz (51.6 kg)] 113 lb 12.1 oz (51.6 kg) (06/22 0500)   Recent Labs Lab 03/08/15 0814 03/08/15 1216 03/08/15 1554 03/09/15 0002 03/09/15 0755  GLUCAP 98 104* 89 89 80    Recent Labs Lab 03/04/15 0420 03/05/15 0508 03/06/15 0422 03/07/15 0430 03/09/15 0606  NA 150* 146* 146* 143 141  K 4.7 4.3 4.1 3.8 4.1  CL 120* 116* 117* 114* 111  CO2 22 21* 22 21* 22  GLUCOSE 143* 144* 130* 122* 92  BUN 105* 100* 92* 85* 78*  CREATININE 4.63* 4.26*  3.99* 3.89* 4.00*  CALCIUM 8.3* 8.3* 8.3* 8.0* 8.4*  MG 1.9  --   --   --   --   PHOS 2.5  --   --   --   --     Recent Labs Lab 03/03/15 0420  AST 30  ALT 22  ALKPHOS 74  BILITOT 0.9  PROT 5.7*  ALBUMIN 1.9*    Recent Labs Lab 03/03/15 0420 03/04/15 0420 03/05/15 0508 03/06/15 0422 03/06/15 1420 03/07/15 0430  WBC 9.3 8.2 10.2 12.5*  --  14.7*  HGB 8.1* 7.0* 7.1* 6.7* 9.3* 9.0*  HCT 24.8* 21.8* 22.1* 21.2* 28.7* 27.7*  MCV 87.3 89.3 89.8 91.4  --  88.5  PLT 194 171 203 222  --  254   No results for input(s): CKTOTAL, CKMB, CKMBINDEX, TROPONINI in the last 168 hours.  Recent Labs  03/08/15 0935  LABPROT 13.7  INR 1.03   No results for input(s): COLORURINE, LABSPEC, PHURINE, GLUCOSEU, HGBUR, BILIRUBINUR, KETONESUR, PROTEINUR, UROBILINOGEN, NITRITE, LEUKOCYTESUR in the last 72 hours.  Invalid input(s): APPERANCEUR     Component Value Date/Time   CHOL 99 03/09/2015 0606   TRIG 90 03/09/2015 0606   HDL 31* 03/09/2015 0606   CHOLHDL 3.2 03/09/2015 0606   VLDL 18 03/09/2015 0606   LDLCALC 50 03/09/2015 0606   No results found for: HGBA1C    Component Value Date/Time   LABOPIA NONE DETECTED 02/26/2015 0800   COCAINSCRNUR POSITIVE* 02/26/2015 0800  LABBENZ NONE DETECTED 02/26/2015 0800   AMPHETMU NONE DETECTED 02/26/2015 0800   THCU NONE DETECTED 02/26/2015 0800   LABBARB Negative 02/28/2015 0920   LABBARB NONE DETECTED 02/26/2015 0800    No results for input(s): ETH in the last 168 hours.  No results found.   PHYSICAL EXAM Frail malnourished looking middle-aged African-American male who is intubated. . Afebrile. Head is nontraumatic. Neck is supple without bruit.    Cardiac exam no murmur or gallop. Lungs are clear to auscultation. Distal pulses are well felt.  Neurological Exam : Awake alert and follows commands well. Pupils 3 mm irregular sluggishly reactive. Extraocular movements are full range. Blinks to threat bilaterally. Fundi were not  visualized. Vision acuity cannot be reliably tested. Mild left lower facial weakness. Tongue midline. Good cough and gag. Left hemiparesis with 3/5 strength. Able to move right upper and lower extremity well against gravity without weakness. Subjective diminished touch pinprick sensation on the left. Deep tendon pulses are depressed on the left and normal on the right. Left plantar upgoing right is equivocal. Gait was not tested.  ASSESSMENT/PLAN Lee Swanson is a 64 y.o. male with history of medical noncompliance, hypertension, tobacco use, cocaine use, alcohol use, chronic kidney disease, suicidal ideations, hepatitis C, Barrett's esophagus, and thrombocytopenia found unresponsive. He was round to have PRES. Subsequent MRIs showed progressive ischemic infarcts. He did not receive IV t-PA due to unknown onset.   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   new infarcts (R posterior frontal lobe, L periatrial, R thalamus and R cerebellum)   acute/ subacute infarcts (L periatrial region, L thalamus and L cerebellum). SAH cleared. Tiny deep IVH stable.   old (R posterior corona radiata, small B basal ganglia, small R paracentral pontine).    Cerebellar hemorrhagic ischemia.    Global atrophy.   C3-4 bulge with mild cord flattening.     Unable to do CTA d/t elevated Cr  TCD ordered   Carotid Doppler  ordered  2D Echo  01/26/15 no source of embolus  Recommend TEE to look for embolic source. Please arrange with pts cardiologist or cardiologist of choice. If positive for PFO (patent foramen ovale), check bilateral lower extremity venous dopplers to rule out DVT as possible source of stroke.    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 Aspiration. On abx.  Other Pertinent History  Hepatitis C  Hospital day # 11  Lee Swanson  [facility] See Amion for Pager information 03/09/2015 2:05 PM  I have personally examined this patient, reviewed notes, independently viewed imaging studies, participated in medical decision making and plan of care. I have made any additions or clarifications directly to the above note. Agree with  note above.  The patient had presented with cocaine-induced encephalopathy with multiple infarcts whose had some recent neurological worsening and MRI scan shows increasing bi cerebral small infarcts. Possibilities include cocaine related vasculitis, small vessel disease or cardiogenic emboli. Marland KitchenAwait TCD study. Discussed with Dr. Erick Alley, MD Medical Director [facility] Pager: [phone] 03/09/2015 2:05 PM    To contact Stroke Continuity provider, please refer to [email or web address]. After hours, contact General Neurology

## 2015-03-09 NOTE — Progress Notes (Signed)
PULMONARY / CRITICAL CARE MEDICINE   Name: Lee Swanson MRN: [id number] DOB: 07/04/1951    ADMISSION DATE:  02/26/2015    INITIAL PRESENTATION:  49  Male with hx of substance abuse, medical non compliance, HTN. CRD, suicidal  Ideation who was found down for unknown time, breathing, extremely hypertensive 250/155 and was transported to [facility] ED. CT scan with small SDH, started on nipride, intubated. NS consulted and MRI ordered. He was cocaine + on admit. PCCM admitted.  MAJOR EVENTS/TEST RESULTS: 6/11 admitted as above. 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 The differential diagnosis does include toxic demyelination, but that is felt less likely 6/12 renal US: Echogenic renal parenchyma bilaterally, compatible with medical renal disease. No hydronephrosis 6/12 Neurology consultation: PRES vs toxic leukoencephalopathy  6/14 More responsive. Fever, copious purulent secretions. Resp culture and empiric abx initiated. Precedex initiated 6/15 Tolerates PS 5-10 cm H2O. Poor mentation and copious ET secretions prohibited extubation 6/16 Failed extubation almost immediately due to poor handling of oropharyngeal secretions 6/17 Tolerates PS 5-10 cm H2O. + F/C 6/19 MRI brain repeat>>>Improved pres, Progressive infarcts with new infarcts noted involving portions ofthe posterior right frontal lobe, left periatrial region, right thalamus and right cerebellum with persistent acute/ subacute infarcts involving the left periatrial region, left thalamus and left cerebellum. Tiny amount hemorrhage associated with the left cerebellar infarct not entirely excluded. 6/22 trach, secretions  remain an issue  INDWELLING DEVICES: L femoral A-line 6/11 >> 6/14 ETT 6/11 >> 6/16>>>6/16>>>6/22 Trach 6/22>>> R [place] CVL 6/11 >>   MICRO DATA: MRSA PCR 6/11 >> NEG Resp 6/14 >> Klebsiella and enterobacter  ANTIMICROBIALS:  Vanc 6/14 >> 6/17 Ceftriaxone 6/14 >>  SUBJECTIVE:  Post trach weak, secretions oral still bad  VITAL SIGNS: Temp:  [98.3 F (36.8 C)-99.2 F (37.3 C)] 98.6 F (37 C) (06/22 0756) Pulse Rate:  [56-78] 56 (06/22 1300) Resp:  [13-19] 16 (06/22 1300) BP: (112-150)/(71-92) 125/75 mmHg (06/22 1300) SpO2:  [100 %] 100 % (06/22 1300) FiO2 (%):  [40 %] 40 % (06/22 1146) Weight:  [51.6 kg (113 lb 12.1 oz)] 51.6 kg (113 lb 12.1 oz) (06/22 0500) HEMODYNAMICS:   VENTILATOR SETTINGS: Vent Mode:  [-] PRVC FiO2 (%):  [40 %] 40 % Set Rate:  [16 bmp] 16 bmp Vt Set:  [500 mL] 500 mL PEEP:  [5 cmH20] 5 cmH20 Plateau Pressure:  [12 cmH20-16 cmH20] 12 cmH20 INTAKE / OUTPUT:  Intake/Output Summary (Last 24 hours) at 03/09/15 1409 Last data filed at 03/09/15 1300  Gross per 24 hour  Intake 586.67 ml  Output   1840 ml  Net -1253.33 ml    PHYSICAL EXAMINATION: General: Cachectic, RASS -1 pre trach Neuro:RASS -1 following commands, weak left upper, uppers weak HEENT: temporal wasting Cardiovascular: s1 s2 RRR Lungs: CTA to bases Abdomen: Soft, nondistended, +BS Ext: no edema  LABS:  CBC  Recent Labs Lab 03/05/15 0508 03/06/15 0422 03/06/15 1420 03/07/15 0430  WBC 10.2 12.5*  --  14.7*  HGB 7.1* 6.7* 9.3* 9.0*  HCT 22.1* 21.2* 28.7* 27.7*  PLT 203 222  --  254   Coag's  Recent Labs Lab 03/08/15 0935  APTT 33  INR 1.03   BMET  Recent Labs Lab 03/06/15 0422 03/07/15 0430  03/09/15 0606  NA 146* 143 141  K 4.1 3.8 4.1  CL 117* 114* 111  CO2 22 21* 22  BUN 92* 85* 78*  CREATININE 3.99* 3.89* 4.00*  GLUCOSE 130* 122* 92   Electrolytes  Recent Labs Lab 03/04/15 0420  03/06/15 0422 03/07/15 0430 03/09/15 0606  CALCIUM 8.3*  < >  8.3* 8.0* 8.4*  MG 1.9  --   --   --   --   PHOS 2.5  --   --   --   --   < > = values in this interval not displayed. Sepsis Markers No results for input(s): LATICACIDVEN, PROCALCITON, O2SATVEN in the last 168 hours. ABG No results for input(s): PHART, PCO2ART, PO2ART in the last 168 hours. Liver Enzymes  Recent Labs Lab 03/03/15 0420  AST 30  ALT 22  ALKPHOS 74  BILITOT 0.9  ALBUMIN 1.9*   Cardiac Enzymes No results for input(s): TROPONINI, PROBNP in the last 168 hours. Glucose  Recent Labs Lab 03/08/15 0015 03/08/15 0814 03/08/15 1216 03/08/15 1554 03/09/15 0002 03/09/15 0755  GLUCAP 99 98 104* 89 89 80    CXR: pending  ASSESSMENT / PLAN:  PULMONARY A: Acute VDRF due to AMS PNA Rt base VAP? Aspiration? P:   Trach performed Then wean TC once more awake, observe closely Assess secretions closely, big issue still secondary to cva pcxr post trach needed Keep same MV on vent  CARDIOVASCULAR A: Malignant hypertension, now controlled to some extent 1 week out, reduce further BP goals P:  Cont amlodipine Cont labetalol 300 tid, maxed as HR 60's Cont PRN hydralazine to maintain SBP < 160 mmHg Cont PRN metoprolol to maintain HR < 100/min Hydralazine keep same starting dose, sys 130  RENAL A:   AKI, now nonoliguric, Cr improving CKD stg 5, 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 P:   Even balance goals Kvo, may need lasix (higher dose) bmet in am  Avoid free water with cva noted   GASTROINTESTINAL A:   Hx of GERD/esophagitis Chronic PPI use Protein-calorie malnutrition P:   SUP: enteral famotidine Cont TF restart post trach after place new NGT May need peg, likely, assess TC ability prior  HEMATOLOGIC A:   Mild thrombocytopenia, resolved Anemia of acute illness/CKD without overt blood loss  P: DVT px: SCDs Sub q heparin  INFECTIOUS A:  GNR PNA (Klebs and enterobacter) ? aspiration P:   Weaning well, WBC noted, no fever continue treatment current abx  regimen Will add stop date, consider 7 days  ENDOCRINE A:   Mild hyperglycemia without prior dx of DM P:   Get glu with am lab  NEUROLOGIC A:   Hypertensive encephalopathy PRES improved Multiple new strokes from cocaine, htn on adnmission Chronic micro hemophage Severe debilitation P:   RASS goal: 0 Low dose intermittent fent Trach collar PT consult  FAMILY:  None available. Note, no family has made contact since his admission, social work Engineer, structural time 30 min   Mcarthur Rossetti. Tyson Alias, MD, FACP Pgr: [phone] [place] [facility]

## 2015-03-09 NOTE — Progress Notes (Signed)
NIF -20

## 2015-03-09 NOTE — Procedures (Signed)
Bronchoscopy Procedure Note COLE FURRH [id number] [birth year]  Procedure: Bronchoscopy Indications: Diagnostic evaluation of the airways  Procedure Details Consent: Risks of procedure as well as the alternatives and risks of each were explained to the (patient/caregiver).  Consent for procedure obtained. Time Out: Verified patient identification, verified procedure, site/side was marked, verified correct patient position, special equipment/implants available, medications/allergies/relevent history reviewed, required imaging and test results available.  Performed  In preparation for procedure, patient was given 100% FiO2, bronchoscope lubricated and lidocaine given via ETT (5 ml). Sedation: Benzodiazepines, Muscle relaxants and Etomidate  Airway entered and the following bronchi were examined: RUL, RML, RLL, LUL, LLL and Bronchi.   Bronchoscope removed.    Evaluation Hemodynamic Status: BP stable throughout; O2 sats: stable throughout Patient's Current Condition: stable Specimens:  None Complications: No apparent complications Patient did tolerate procedure well.   Koren Bound 03/09/2015

## 2015-03-10 ENCOUNTER — Inpatient Hospital Stay (HOSPITAL_COMMUNITY): Payer: Medicaid Other

## 2015-03-10 ENCOUNTER — Encounter (HOSPITAL_COMMUNITY): Payer: Self-pay

## 2015-03-10 DIAGNOSIS — I6340 Cerebral infarction due to embolism of unspecified cerebral artery: Secondary | ICD-10-CM

## 2015-03-10 DIAGNOSIS — I639 Cerebral infarction, unspecified: Secondary | ICD-10-CM

## 2015-03-10 LAB — BASIC METABOLIC PANEL
Anion gap: 11 (ref 5–15)
BUN: 77 mg/dL — AB (ref 6–20)
CALCIUM: 8.9 mg/dL (ref 8.9–10.3)
CO2: 23 mmol/L (ref 22–32)
Chloride: 112 mmol/L — ABNORMAL HIGH (ref 101–111)
Creatinine, Ser: 3.99 mg/dL — ABNORMAL HIGH (ref 0.61–1.24)
GFR calc Af Amer: 17 mL/min — ABNORMAL LOW (ref >60)
GFR, EST NON AFRICAN AMERICAN: 15 mL/min — AB (ref >60)
GLUCOSE: 129 mg/dL — AB (ref 65–99)
Potassium: 3.9 mmol/L (ref 3.5–5.1)
SODIUM: 146 mmol/L — AB (ref 135–145)

## 2015-03-10 LAB — HEMOGLOBIN A1C
Hgb A1c MFr Bld: 4.8 % (ref 4.8–5.6)
MEAN PLASMA GLUCOSE: 91 mg/dL

## 2015-03-10 LAB — GLUCOSE, CAPILLARY
GLUCOSE-CAPILLARY: 129 mg/dL — AB (ref 65–99)
GLUCOSE-CAPILLARY: 92 mg/dL (ref 65–99)
Glucose-Capillary: 140 mg/dL — ABNORMAL HIGH (ref 65–99)

## 2015-03-10 LAB — PHOSPHORUS: Phosphorus: 5.5 mg/dL — ABNORMAL HIGH (ref 2.5–4.6)

## 2015-03-10 LAB — MAGNESIUM: Magnesium: 1.9 mg/dL (ref 1.7–2.4)

## 2015-03-10 MED ORDER — PANTOPRAZOLE SODIUM 40 MG PO PACK
40.0000 mg | PACK | Freq: Every day | ORAL | Status: DC
  Administered 2015-03-10: 40 mg
  Filled 2015-03-10 (×2): qty 20

## 2015-03-10 NOTE — Progress Notes (Signed)
PULMONARY / CRITICAL CARE MEDICINE   Name: Lee Swanson MRN: [id number] DOB: 06-Mar-1951    ADMISSION DATE:  02/26/2015    INITIAL PRESENTATION:  2  Male with hx of substance abuse, medical non compliance, HTN. CRD, suicidal  Ideation who was found down for unknown time, breathing, extremely hypertensive 250/155 and was transported to [facility] ED. CT scan with small SDH, started on nipride, intubated. NS consulted and MRI ordered. He was cocaine + on admit. PCCM admitted.  MAJOR EVENTS/TEST RESULTS: 6/11 admitted as above. 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 The differential diagnosis does include toxic demyelination, but that is felt less likely 6/12 renal US: Echogenic renal parenchyma bilaterally, compatible with medical renal disease. No hydronephrosis 6/12 Neurology consultation: PRES vs toxic leukoencephalopathy  6/14 More responsive. Fever, copious purulent secretions. Resp culture and empiric abx initiated. Precedex initiated 6/15 Tolerates PS 5-10 cm H2O. Poor mentation and copious ET secretions prohibited extubation 6/16 Failed extubation almost immediately due to poor handling of oropharyngeal secretions 6/17 Tolerates PS 5-10 cm H2O. + F/C 6/19 MRI brain repeat>>>Improved pres, Progressive infarcts with new infarcts noted involving portions ofthe posterior right frontal lobe, left periatrial region, right thalamus and right cerebellum with persistent acute/ subacute infarcts involving the left periatrial region, left thalamus and left cerebellum. Tiny amount hemorrhage associated with the left cerebellar infarct not entirely excluded. 6/22 trach, secretions  remain an issue  INDWELLING DEVICES: L femoral A-line 6/11 >> 6/14 ETT 6/11 >> 6/16>>>6/16>>>6/22 Trach 6/22(DF) >>> R  CVL 6/11 >>   MICRO DATA: MRSA PCR 6/11 >> NEG Resp 6/14 >> Klebsiella and enterobacter  ANTIMICROBIALS:  Vanc 6/14 >> 6/17 Ceftriaxone 6/14 >>  SUBJECTIVE:  Awake, about to trach collar  VITAL SIGNS: Temp:  [97.4 F (36.3 C)-98.9 F (37.2 C)] 98.9 F (37.2 C) (06/23 1201) Pulse Rate:  [57-75] 69 (06/23 1147) Resp:  [13-19] 13 (06/23 1147) BP: (116-148)/(70-86) 120/72 mmHg (06/23 1147) SpO2:  [100 %] 100 % (06/23 1147) FiO2 (%):  [40 %] 40 % (06/23 1200) Weight:  [50.4 kg (111 lb 1.8 oz)] 50.4 kg (111 lb 1.8 oz) (06/23 0500) HEMODYNAMICS:   VENTILATOR SETTINGS: Vent Mode:  [-] PRVC FiO2 (%):  [40 %] 40 % Set Rate:  [16 bmp] 16 bmp Vt Set:  [500 mL] 500 mL PEEP:  [5 cmH20] 5 cmH20 Plateau Pressure:  [9 cmH20-15 cmH20] 11 cmH20 INTAKE / OUTPUT:  Intake/Output Summary (Last 24 hours) at 03/10/15 1321 Last data filed at 03/10/15 1200  Gross per 24 hour  Intake   1197 ml  Output   2725 ml  Net  -1528 ml    PHYSICAL EXAMINATION: General: Cachectic, RASS 0, follows commands Neuro:RASS 0, weak on left unchanged HEENT: temporal wasting Cardiovascular: s1 s2 RRR Lungs: CTA  Abdomen: Soft, nondistended, +BS Ext: no edema  LABS:  CBC  Recent Labs Lab 03/05/15 0508 03/06/15 0422 03/06/15 1420 03/07/15 0430  WBC 10.2 12.5*  --  14.7*  HGB 7.1* 6.7* 9.3* 9.0*  HCT 22.1* 21.2* 28.7* 27.7*  PLT 203 222  --  254   Coag's  Recent Labs Lab 03/08/15 0935  APTT 33  INR 1.03   BMET  Recent Labs Lab 03/07/15 0430 03/09/15 0606 03/10/15 0445  NA 143 141 146*  K 3.8 4.1 3.9  CL 114* 111 112*  CO2 21* 22 23  BUN 85* 78* 77*  CREATININE 3.89* 4.00* 3.99*  GLUCOSE 122* 92 129*   Electrolytes  Recent Labs Lab 03/04/15 0420  03/07/15 0430 03/09/15 0606 03/10/15 0445  CALCIUM 8.3*  < > 8.0* 8.4* 8.9  MG 1.9  --   --   --   1.9  PHOS 2.5  --   --   --  5.5*  < > = values in this interval not displayed. Sepsis Markers No results for input(s): LATICACIDVEN, PROCALCITON, O2SATVEN in the last 168 hours. ABG No results for input(s): PHART, PCO2ART, PO2ART in the last 168 hours. Liver Enzymes No results for input(s): AST, ALT, ALKPHOS, BILITOT, ALBUMIN in the last 168 hours. Cardiac Enzymes No results for input(s): TROPONINI, PROBNP in the last 168 hours. Glucose  Recent Labs Lab 03/08/15 1554 03/09/15 0002 03/09/15 0755 03/09/15 1554 03/09/15 2357 03/10/15 0752  GLUCAP 89 89 80 91 92 140*    CXR: none new  ASSESSMENT / PLAN:  PULMONARY A: Acute VDRF due to AMS PNA Rt base VAP? Aspiration? P:   Attempt trach collar, goal 24 / 7, given most important need was secretions, related to cva pcxr not needed in am  Pmv, consider cuff down   CARDIOVASCULAR A: Malignant hypertension, now controlled to some extent 1 week out, reduce further BP goals P:  Cont amlodipine Cont labetalol 300 tid, maxed as HR 60's Cont PRN hydralazine and metop Hydralazine keep same starting dose, sys 130 tele  RENAL A:   AKI, now nonoliguric, Cr improving CKD stg 5, 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 P:   Even balance goals to neg repeat , did well with this Kvo, lasix x 1 dose then dc bmet in am  Avoid free water with cva noted   GASTROINTESTINAL A:   Hx of GERD/esophagitis Chronic PPI use Protein-calorie malnutrition P:   SUP:PPI , is home ,med, dc pepcid TF NO PEG, hope can avoid>?  HEMATOLOGIC A:   Mild thrombocytopenia, resolved Anemia of acute illness/CKD without overt blood loss  P: DVT px: SCDs Sub q heparin  INFECTIOUS A:  GNR PNA (Klebs and enterobacter) ? aspiration P:   Stop date today abx, allow Wbc in am   ENDOCRINE A:   Mild hyperglycemia without prior dx of DM P:   Get glu with am lab  NEUROLOGIC A:   Hypertensive encephalopathy PRES improved Multiple new strokes from cocaine, htn on  adnmission Chronic micro hemophage Severe debilitation P:   RASS goal: 0 Low dose intermittent fent Trach collar PT consult PMV today  FAMILY:  None available. Note, no family has made contact since his admission, social work helping  To sdu   Ccm time 30 min   Mcarthur Rossetti. Tyson Alias, MD, FACP Pgr: [phone] [place] [facility]

## 2015-03-10 NOTE — Evaluation (Signed)
Physical Therapy Evaluation Patient Details Name: Lee Swanson MRN: [id number] DOB: Aug 23, 1951 Today's Date: 03/10/2015   History of Present Illness  Pt is a 64 y/o male with a PMH of med noncompliance, HTN, tobacco/cocaine/ETOH, CKD, hep C, Barrett's esophagus. Pt presented to [facility] on 02/26/15 after he was found down for an unknown period of time. BP was noted to be 250/155. An MRI was consistent with posterior reversible encephalopathy syndrome with profound edema throughout the brain stem and an acute left subarachnoid hemorrhage. A CT scan of the head showed a small left subdural hematoma. Neurosurgery was consulted although surgery was not felt to be indicated. Neurology has been following until MRI showed increasing strokes. He was transferred to the stroke team 03/08/2015 for continued followup.   Clinical Impression  Pt admitted with above diagnosis. Pt currently with functional limitations due to the deficits listed below (see PT Problem List). At the time of PT eval pt was able to perform transfers to/from EOB with +2 assist for LE movement and trunk support. Pt demonstrated the ability to use RUE for reaching and on bed rail during rolling activity. Minimal active movement noted on the L side, and L inattention was noted. During session, O2 sats were at 100% on 40% FiO2. RR remained between 12-16 breaths/min throughout activity.   Pt will benefit from skilled PT to increase their independence and safety with mobility to allow discharge to the venue listed below.  Recommending continued skilled therapy services at the CIR level to maximize independence with mobility and ADL's prior to return home.      Follow Up Recommendations CIR    Equipment Recommendations  Wheelchair (measurements PT);Wheelchair cushion (measurements PT);Rolling walker with 5" wheels (Depending on progress with PT)    Recommendations for Other Services Rehab consult, OT consult     Precautions / Restrictions  Precautions Precautions: Fall Restrictions Weight Bearing Restrictions: No      Mobility  Bed Mobility Overal bed mobility: Needs Assistance;+2 for physical assistance Bed Mobility: Rolling;Supine to Sit;Sit to Supine Rolling: Mod assist   Supine to sit: Mod assist;+2 for physical assistance Sit to supine: Mod assist;+2 for physical assistance   General bed mobility comments: Pt was able to reach for L railing with RUE during rolling to the L. He had difficulty reaching to the R with LUE, however once UE was extended, held himself up on the railing briefly before rolling back to supine. Bed pad was used for transition to/from EOB and assist was provided for LE movement and trunk support. Pt appeared to attempt initiation of movement on the R side >L side.   Transfers                 General transfer comment: Further mobility was not attempted at this time.   Ambulation/Gait                Stairs            Wheelchair Mobility    Modified Rankin (Stroke Patients Only)       Balance Overall balance assessment: Needs assistance Sitting-balance support: Feet supported;Bilateral upper extremity supported Sitting balance-Leahy Scale: Poor Sitting balance - Comments: Pt was able to hold himself up for brief periods (5-10 seconds) however required assist throughout session for improved posture and trunk support.  Pertinent Vitals/Pain Pain Assessment: Faces Faces Pain Scale: Hurts a little bit Pain Location: Grimacing with movement at times. Not able to pin-point what was uncomfortable due to communication difficulty Pain Intervention(s): Limited activity within patient's tolerance;Monitored during session    Home Living Family/patient expects to be discharged to:: Inpatient rehab Living Arrangements: Other (Comment) (Per previous admit, pt lives at a rooming house (?))                    Prior  Function           Comments: PLOF unknown at this time.      Hand Dominance        Extremity/Trunk Assessment   Upper Extremity Assessment: LUE deficits/detail       LUE Deficits / Details: Decreased strength and AROM consistent with deficits from a stroke. Inattention to the LUE noted. Demonstrated more active movement with general mobility than when specifically asked to move the arm.    Lower Extremity Assessment: LLE deficits/detail   LLE Deficits / Details: Decreased strength and AROM consistent with deficits from a stroke. Pt with little active movement of the LLE, however was able to hold an eccentric contraction on the L during MMT.   Cervical / Trunk Assessment: Other exceptions  Communication   Communication: Tracheostomy  Cognition Arousal/Alertness: Awake/alert Behavior During Therapy: Flat affect Overall Cognitive Status: Difficult to assess                      General Comments General comments (skin integrity, edema, etc.): Pt was able to lean onto his R elbow, prop himself with good stabilization of the shoulder. Pt required mod assist to press back up to full sitting. He required assist to lean onto his LUE and therapist manually stabilized at the shoulder joint for leaning weight onto the elbow. Pt was able to shift weight to return to full sitting with min assist.     Exercises        Assessment/Plan    PT Assessment Patient needs continued PT services  PT Diagnosis Difficulty walking;Generalized weakness   PT Problem List Decreased strength;Decreased range of motion;Decreased activity tolerance;Decreased balance;Decreased mobility;Decreased knowledge of use of DME;Decreased safety awareness;Decreased knowledge of precautions  PT Treatment Interventions DME instruction;Gait training;Stair training;Functional mobility training;Therapeutic activities;Therapeutic exercise;Neuromuscular re-education;Patient/family education   PT Goals (Current  goals can be found in the Care Plan section) Acute Rehab PT Goals PT Goal Formulation: Patient unable to participate in goal setting Time For Goal Achievement: 03/24/15 Potential to Achieve Goals: Good    Frequency Min 3X/week   Barriers to discharge        Co-evaluation               End of Session Equipment Utilized During Treatment: Oxygen Activity Tolerance: Patient tolerated treatment well Patient left: in bed;with call bell/phone within reach;with bed alarm set Nurse Communication: Mobility status         Time: 7414-2395 PT Time Calculation (min) (ACUTE ONLY): 31 min   Charges:   PT Evaluation $Initial PT Evaluation Tier I: 1 Procedure PT Treatments $Therapeutic Activity: 8-22 mins   PT G Codes:        Conni Slipper 04-07-2015, 3:40 PM   Conni Slipper, PT, DPT Acute Rehabilitation Services Pager: [phone]

## 2015-03-10 NOTE — Progress Notes (Addendum)
Rehab Admissions Coordinator Note:  Patient was screened by Clois Dupes for appropriateness for an Inpatient Acute Rehab Consult per PT recommendation.   At this time, we are recommending SNF placement. Noted that pt has no caregiver assistance for noted  landlord is only listed NOK. Pt does not want daughter involved.   Clois Dupes 03/10/2015, 4:22 PM  I can be reached at [phone].

## 2015-03-10 NOTE — Progress Notes (Signed)
VASCULAR LAB PRELIMINARY  PRELIMINARY  PRELIMINARY  PRELIMINARY  Carotid duplex completed.    Preliminary report:  Bilateral:  1-39% ICA stenosis.  Vertebral artery flow is antegrade.      Lee Swanson, RVT 03/10/2015, 9:53 AM

## 2015-03-10 NOTE — Progress Notes (Signed)
eLink Physician-Brief Progress Note Patient Name: SHYAM HEGLUND DOB: 01-21-1951 MRN: [id number]   Date of Service  03/10/2015  HPI/Events of Note  Order clarification for trach collar Breathing comfortably  eICU Interventions  Trach collar order written     Intervention Category Minor Interventions: Routine modifications to care plan (e.g. PRN medications for pain, fever)  MCQUAID, DOUGLAS 03/10/2015, 8:23 PM

## 2015-03-10 NOTE — Progress Notes (Signed)
Placed patient on 40% tracheostomy collar per MD request.  Patient is tolerating well HR 70, RR 12, Bp 120/72, 02 saturation 100%.  RN is aware.

## 2015-03-10 NOTE — Plan of Care (Signed)
Problem: Phase I Progression Outcomes Goal: Patient tolerating weaning plan Outcome: Progressing Pt on 40% trach collar.  Tolerating well.  Lungs are course and diminished.  O2 sat > 92%.  Will continue to monitor.   Problem: RH PAIN MANAGEMENT Goal: RH STG PAIN MANAGED AT OR BELOW PT'S PAIN GOAL Outcome: Progressing No complaints of pain.  Will continue to monitor.

## 2015-03-10 NOTE — Progress Notes (Signed)
TCD completed. 

## 2015-03-10 NOTE — Progress Notes (Signed)
Pt unable to perfom NIF and FVC at this time

## 2015-03-10 NOTE — Progress Notes (Signed)
STROKE TEAM PROGRESS NOTE   HISTORY ATTHEW Swanson is an 64 y.o. male with a history of medical noncompliance, hypertension, tobacco use, cocaine use, alcohol use, chronic kidney disease, suicidal ideations, hepatitis C, Barrett's esophagus, and thrombocytopenia, brought to [facility] 02/26/2015 after he was found down for an unknown period of time. He was noted to have a blood pressure of 250/155. he was placed on a Cardene drip and intubated.  A UDS was positive for cocaine. An MRI was consistent with posterior reversible encephalopathy syndrome with profound edema throughout the brain stem and an acute left subarachnoid hemorrhage. A CT scan of the head showed a small left subdural hematoma. Neurosurgery was consulted although surgery was not felt to be indicated. Neurology has been following until today MRI showed increasing strokes. He was transferred to the stroke team 03/08/2015 for continued followup.    SUBJECTIVE (INTERVAL HISTORY) His RN is at the bedside. had trach y`day. No changes.   OBJECTIVE Temp:  [97.4 F (36.3 C)-98.9 F (37.2 C)] 98.9 F (37.2 C) (06/23 1201) Pulse Rate:  [57-75] 73 (06/23 1300) Cardiac Rhythm:  [-] Normal sinus rhythm (06/23 0814) Resp:  [10-19] 10 (06/23 1300) BP: (116-148)/(66-86) 125/67 mmHg (06/23 1300) SpO2:  [100 %] 100 % (06/23 1300) FiO2 (%):  [40 %] 40 % (06/23 1200) Weight:  [111 lb 1.8 oz (50.4 kg)] 111 lb 1.8 oz (50.4 kg) (06/23 0500)   Recent Labs Lab 03/09/15 0002 03/09/15 0755 03/09/15 1554 03/09/15 2357 03/10/15 0752  GLUCAP 89 80 91 92 140*    Recent Labs Lab 03/04/15 0420 03/05/15 0508 03/06/15 0422 03/07/15 0430 03/09/15 0606 03/10/15 0445  NA 150* 146* 146* 143 141 146*  K 4.7 4.3 4.1 3.8 4.1 3.9  CL 120* 116* 117* 114* 111 112*  CO2 22 21* 22 21* 22 23  GLUCOSE 143* 144* 130* 122* 92 129*  BUN 105* 100* 92* 85* 78* 77*  CREATININE 4.63* 4.26* 3.99* 3.89* 4.00* 3.99*  CALCIUM 8.3* 8.3* 8.3* 8.0* 8.4*  8.9  MG 1.9  --   --   --   --  1.9  PHOS 2.5  --   --   --   --  5.5*   No results for input(s): AST, ALT, ALKPHOS, BILITOT, PROT, ALBUMIN in the last 168 hours.  Recent Labs Lab 03/04/15 0420 03/05/15 0508 03/06/15 0422 03/06/15 1420 03/07/15 0430  WBC 8.2 10.2 12.5*  --  14.7*  HGB 7.0* 7.1* 6.7* 9.3* 9.0*  HCT 21.8* 22.1* 21.2* 28.7* 27.7*  MCV 89.3 89.8 91.4  --  88.5  PLT 171 203 222  --  254   No results for input(s): CKTOTAL, CKMB, CKMBINDEX, TROPONINI in the last 168 hours.  Recent Labs  03/08/15 0935  LABPROT 13.7  INR 1.03   No results for input(s): COLORURINE, LABSPEC, PHURINE, GLUCOSEU, HGBUR, BILIRUBINUR, KETONESUR, PROTEINUR, UROBILINOGEN, NITRITE, LEUKOCYTESUR in the last 72 hours.  Invalid input(s): APPERANCEUR     Component Value Date/Time   CHOL 99 03/09/2015 0606   TRIG 90 03/09/2015 0606   HDL 31* 03/09/2015 0606   CHOLHDL 3.2 03/09/2015 0606   VLDL 18 03/09/2015 0606   LDLCALC 50 03/09/2015 0606   Lab Results  Component Value Date   HGBA1C 4.8 03/09/2015      Component Value Date/Time   LABOPIA NONE DETECTED 02/26/2015 0800   COCAINSCRNUR POSITIVE* 02/26/2015 0800   LABBENZ NONE DETECTED 02/26/2015 0800   AMPHETMU NONE DETECTED 02/26/2015 0800   THCU  NONE DETECTED 02/26/2015 0800   LABBARB Negative 02/28/2015 0920   LABBARB NONE DETECTED 02/26/2015 0800    No results for input(s): ETH in the last 168 hours.  Dg Chest Port 1 View  03/09/2015   CLINICAL DATA:  Tracheostomy placement  EXAM: PORTABLE CHEST - 1 VIEW  COMPARISON:  March 07, 2015  FINDINGS: Tracheostomy tube tip is 4.3 cm above the carina. Port-A-Cath tip is in the superior vena cava. Nasogastric tube is no longer present. No pneumothorax.  There is persistent consolidation in the left lower lobe. There are bilateral effusions. Heart is enlarged with pulmonary vascularity within normal limits.  IMPRESSION: Tube and catheter positions as described without pneumothorax. Persistent  bilateral pleural effusions. Persistent airspace consolidation left lower lobe. Stable cardiomegaly. Suspect a degree of underlying congestive heart failure.   Electronically Signed   By: Bretta Bang III M.D.   On: 03/09/2015 14:50   Dg Abd Portable 1v  03/09/2015   CLINICAL DATA:  Nasogastric tube placement  EXAM: PORTABLE ABDOMEN - 1 VIEW  COMPARISON:  March 04, 2015  FINDINGS: Nasogastric tube tip and side port are in the stomach. There is no bowel dilatation or air-fluid level suggesting obstruction. No free air. There is consolidation in the left lower lobe.  IMPRESSION: Nasogastric tube tip and side port in stomach. Bowel gas pattern unremarkable. Left lower lobe airspace consolidation.   Electronically Signed   By: Bretta Bang III M.D.   On: 03/09/2015 17:23     PHYSICAL EXAM Frail malnourished looking middle-aged African-American male who is on ventilator and has tracheostomy. . Afebrile. Head is nontraumatic. Neck is supple without bruit.    Cardiac exam no murmur or gallop. Lungs are clear to auscultation. Distal pulses are well felt.  Neurological Exam : Awake alert and follows commands well. Pupils 3 mm irregular sluggishly reactive. Extraocular movements are full range. Blinks to threat bilaterally. Fundi were not visualized. Vision acuity cannot be reliably tested. Mild left lower facial weakness. Tongue midline. Good cough and gag. Left hemiparesis with 3/5 strength. Able to move right upper and lower extremity well against gravity without weakness. Subjective diminished touch pinprick sensation on the left. Deep tendon pulses are depressed on the left and normal on the right. Left plantar upgoing right is equivocal. Gait was not tested.  ASSESSMENT/PLAN Lee Swanson is a 64 y.o. male with history of medical noncompliance, hypertension, tobacco use, cocaine use, alcohol use, chronic kidney disease, suicidal ideations, hepatitis C, Barrett's esophagus, and  thrombocytopenia found unresponsive. He was round to have PRES. Subsequent MRIs showed progressive ischemic infarcts. He did not receive IV t-PA due to unknown onset.   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   new infarcts (R posterior frontal lobe, L periatrial, R thalamus and R cerebellum)   acute/ subacute infarcts (L periatrial region, L thalamus and L cerebellum). SAH cleared. Tiny deep IVH stable.   old (R posterior corona radiata, small B basal ganglia, small R paracentral pontine).    Cerebellar hemorrhagic ischemia.    Global atrophy.   C3-4 bulge with mild cord flattening.     Unable to do CTA d/t elevated Cr  TCD done Carotid Doppler  Bilateral: 1-39% ICA stenosis. Vertebral artery flow is antegrade.     2D Echo  01/26/15 no source of embolus TEE unable to pass  probe     LDL 50 mg%  HgbA1c add to am labs  Heparin 5000 units sq tid for VTE prophylaxis Diet NPO time specified  aspirin 81 mg orally every day prior to admission, now on no antithrombotic given SAH   Therapy recommendations:  pending   Disposition:  pending   Malignant Hypertension with hypertensive encephalopathy   Chronic microhemorrhages on imaging  SBP Goal < 160  Hyperglycemia  No hx diabetes  HgbA1c pending   Other Stroke Risk Factors  Cigarette smoker  ETOH use  Cocaine use, positive this admission  Other Active Problems  CKD stage 5, 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 Aspiration. On abx.  Other Pertinent History  Hepatitis C  Hospital day # 12  SETHI,PRAMOD  [facility] See Amion for Pager information 03/10/2015 1:36 PM  I have personally examined this patient, reviewed notes, independently viewed imaging studies, participated in medical decision making and plan of care. I have made any additions  or clarifications directly to the above note. Agree with note above.  The patient had presented with cocaine-induced encephalopathy with multiple infarcts whose had some recent neurological worsening and MRI scan shows increasing bi cerebral small infarcts. Possibilities include cocaine related vasculitis, small vessel disease  doubt cardiogenic emboli. .Check TCD study results. Discussed with Dr. Tyson Alias. Stroke service will sign off. Kindly call for questions.  Delia Heady, MD Medical Director [facility] Pager: [phone] 03/10/2015 1:36 PM    To contact Stroke Continuity provider, please refer to [email or web address]. After hours, contact General Neurology

## 2015-03-10 NOTE — Trach Care Team (Signed)
Trach Care Progression Note   Patient Details Name: Lee Swanson MRN: [id number] DOB: 08-11-1951 Today's Date: 03/10/2015   Tracheostomy Assessment    Tracheostomy Shiley 8 mm Cuffed (Active)  Status Secured 03/10/2015 11:47 AM  Site Assessment Dry;Clean 03/10/2015  8:14 AM  Site Care Cleansed;Dressing applied 03/10/2015  8:14 AM  Inner Cannula Care Changed/new 03/10/2015  8:14 AM  Ties Assessment Secure 03/10/2015 11:47 AM  Cuff pressure (cm) 22 cm 03/09/2015 11:46 AM  Emergency Equipment at bedside Yes 03/10/2015 11:47 AM     Care Needs     Respiratory Therapy O2 Device: Tracheostomy Collar FiO2 (%): 40 % SpO2: 100 %    Speech Language Pathology  SLP chart review complete: Patient not ready for SLP services SLP barriers to progression: Secretions   Physical Therapy      Occupational Therapy      Nutritional Patient's Current Diet: Tube feeding Tube Feeding: Vital AF 1.2 Cal Tube Feeding Frequency: Continuous Tube Feeding Strength: Full strength    Case Management/Social Work      Theatre manager Care Team/Provider Recommendations [facility] Team Members Present-  Lysbeth Penner, RT, Jackpot, SW   Anders Simmonds, NP Trach on yesterday and trach collar trials today. Continue to follow.          Braeden Kennan, Silva Bandy (scribe for team) 03/10/2015, 2:47 PM

## 2015-03-11 LAB — CBC
HEMATOCRIT: 29.6 % — AB (ref 39.0–52.0)
Hemoglobin: 9.5 g/dL — ABNORMAL LOW (ref 13.0–17.0)
MCH: 28.8 pg (ref 26.0–34.0)
MCHC: 32.1 g/dL (ref 30.0–36.0)
MCV: 89.7 fL (ref 78.0–100.0)
Platelets: 314 10*3/uL (ref 150–400)
RBC: 3.3 MIL/uL — AB (ref 4.22–5.81)
RDW: 15.1 % (ref 11.5–15.5)
WBC: 14.8 10*3/uL — AB (ref 4.0–10.5)

## 2015-03-11 LAB — BASIC METABOLIC PANEL
Anion gap: 12 (ref 5–15)
BUN: 80 mg/dL — AB (ref 6–20)
CHLORIDE: 108 mmol/L (ref 101–111)
CO2: 26 mmol/L (ref 22–32)
CREATININE: 4.34 mg/dL — AB (ref 0.61–1.24)
Calcium: 8.8 mg/dL — ABNORMAL LOW (ref 8.9–10.3)
GFR calc Af Amer: 15 mL/min — ABNORMAL LOW (ref >60)
GFR calc non Af Amer: 13 mL/min — ABNORMAL LOW (ref >60)
GLUCOSE: 118 mg/dL — AB (ref 65–99)
POTASSIUM: 3.9 mmol/L (ref 3.5–5.1)
Sodium: 146 mmol/L — ABNORMAL HIGH (ref 135–145)

## 2015-03-11 LAB — GLUCOSE, CAPILLARY
GLUCOSE-CAPILLARY: 115 mg/dL — AB (ref 65–99)
GLUCOSE-CAPILLARY: 122 mg/dL — AB (ref 65–99)
Glucose-Capillary: 108 mg/dL — ABNORMAL HIGH (ref 65–99)
Glucose-Capillary: 98 mg/dL (ref 65–99)

## 2015-03-11 LAB — PHOSPHORUS: PHOSPHORUS: 3.6 mg/dL (ref 2.5–4.6)

## 2015-03-11 LAB — MAGNESIUM: Magnesium: 1.9 mg/dL (ref 1.7–2.4)

## 2015-03-11 MED ORDER — PANTOPRAZOLE SODIUM 40 MG IV SOLR
40.0000 mg | INTRAVENOUS | Status: DC
  Administered 2015-03-11 – 2015-03-15 (×5): 40 mg via INTRAVENOUS
  Filled 2015-03-11 (×6): qty 40

## 2015-03-11 MED ORDER — DEXTROSE 5 % IV SOLN
INTRAVENOUS | Status: DC
  Administered 2015-03-11: 13:00:00 via INTRAVENOUS
  Administered 2015-03-12: 50 mL via INTRAVENOUS
  Administered 2015-03-13 – 2015-03-16 (×2): via INTRAVENOUS

## 2015-03-11 MED ORDER — LABETALOL HCL 5 MG/ML IV SOLN
10.0000 mg | INTRAVENOUS | Status: DC | PRN
  Administered 2015-03-12: 10 mg via INTRAVENOUS
  Filled 2015-03-11: qty 4

## 2015-03-11 NOTE — Progress Notes (Addendum)
Nutrition Follow-up  DOCUMENTATION CODES:  Underweight, Severe malnutrition in context of chronic illness  INTERVENTION:   If unable to safely advance diet, recommend place NGT for TF: Jevity 1.2 at 65 ml/h would provide 1872 kcals, 87 gm protein, 1264 ml free water daily.  NUTRITION DIAGNOSIS:  Malnutrition related to chronic illness, inability to eat as evidenced by estimated needs, NPO status, severe depletion of muscle mass, severe depletion of body fat.  Ongoing   GOAL:  Patient will meet greater than or equal to 90% of their needs  Unmet  MONITOR:  Diet advancement, PO intake, Weight trends, Labs  ASSESSMENT:  Patient admitted on 6/11 with AMS, found unresponsive, HTN crisis, and SAH. MRI head showed brain stem edema, micro bleeds.  Discussed patient in ICU rounds and with RN today. Patient no longer requiring vent support. S/P trach on 6/22. Currently on trach collar. TF off. SLP consulted for swallow evaluation. If unable to pass swallow evaluation, plans to place NGT for TF and meds.   Height:  Ht Readings from Last 1 Encounters:  02/26/15 5\' 8"  (1.727 m)    Weight:  Wt Readings from Last 1 Encounters:  03/11/15 108 lb 14.5 oz (49.4 kg)    Ideal Body Weight:  70 kg  Wt Readings from Last 10 Encounters:  03/11/15 108 lb 14.5 oz (49.4 kg)  02/08/15 115 lb (52.164 kg)  01/29/15 115 lb 11.2 oz (52.481 kg)    BMI:  Body mass index is 16.56 kg/(m^2).  Estimated Nutritional Needs:  Kcal:  1700-1900  Protein:  75-95 gm  Fluid:  1.7-1.9 L  Skin:  Reviewed, no issues  Diet Order:  Diet NPO time specified  EDUCATION NEEDS:  No education needs identified at this time   Intake/Output Summary (Last 24 hours) at 03/11/15 1455 Last data filed at 03/11/15 1000  Gross per 24 hour  Intake   1490 ml  Output   2690 ml  Net  -1200 ml    Last BM:  6/21   Joaquin Courts, RD, LDN, CNSC Pager [phone] After Hours Pager [phone]

## 2015-03-11 NOTE — Progress Notes (Signed)
eLink Physician-Brief Progress Note Patient Name: CANDON DISCHLER DOB: August 08, 1951 MRN: [id number]   Date of Service  03/11/2015  HPI/Events of Note  Pt agitated.  Already pulled out NG tube, and now pulling at trach.   eICU Interventions  Will have soft wrist restraints placed.     Intervention Category Major Interventions: Other:  Khori Rosevear 03/11/2015, 6:37 PM

## 2015-03-11 NOTE — Evaluation (Signed)
[facility] - Evaluation Patient Details  Name: Lee Swanson MRN: [id number] Date of Birth: 01-Aug-1951  Today's Date: 03/11/2015 Time: 1233-1252 SLP Time Calculation (min) (ACUTE ONLY): 19 min  Past Medical History:  Past Medical History  Diagnosis Date  . Chronic kidney disease     Stage IV  . Hypokalemia   . Thrombocytopenia   . Hypertension 01/2015.    Hypertensive emergency.  . Hepatitis C antibody test positive 01/25/2015    HIV testing negative on the same date.  . Elevated troponin I level 01/2015.    Felt to be secondary to demand ischemia in setting of hypertension and cocaine use. Cardiac cath not planned given kidney disease  . Vitamin D deficiency 01/2015.  . Barrett's esophagus 02/05/2015    EGD 01/2015 - anticipate repeat EGD 2019   Past Surgical History:  Past Surgical History  Procedure Laterality Date  . Esophagogastroduodenoscopy N/A 01/27/2015    Procedure: ESOPHAGOGASTRODUODENOSCOPY (EGD);  Surgeon: Iva Boop, MD;  Location: [facility] ENDOSCOPY;  Service: Endoscopy;  Laterality: N/A;   HPI:  64 yr old with hx of substance abuse, medical non compliance, HTN. CRD, suicidal Ideation admitted after being found down for unknown time, extremely hypertensive 250/155 and intubated. Recieved trach 6/22, trach collar 6/23. Per MD note pt cocaine + on admit. 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 Repeat MRI progressive infarcts with new infarcts posterior right frontal, left parietal regin, right thalamus and bilateral cerebellum.  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 Acute left perimesencephalic subarachnoid hemorrhage.   Assessment / Plan / Recommendation Clinical Impression  Pt mouthing words to SLP on  arrival. Tolerated cuff delation without difficulty. Demonstrated back pressure/air traping blowing valve off trach hub with loud air burst upon removal and pt affirmed difficulty breathing after 15-20 seconds. Vitals were in normal range throughout. Despite verbal/tactile/visual cueing pt unable to vocalize. Pt mouthed and gestured his desire to "call my mom." PMSV should only be attempted with ST who will continue to intervene. Swallow can be assessed once using/tolerating valve.    SLP Assessment  Patient needs continued Speech Lanaguage Pathology Services    Follow Up Recommendations  Skilled Nursing facility    Frequency and Duration min 2x/week  2 weeks   Pertinent Vitals/Pain none    SLP Goals Potential to Achieve Goals (ACUTE ONLY): Good Potential Considerations (ACUTE ONLY): Co-morbidities   PMSV Trial  PMSV was placed for:  (15-20 seconds at max) Able to redirect subglottic air through upper airway: Yes Able to Attain Phonation: No Able to Expectorate Secretions: Yes Level of Secretion Expectoration with PMSV: Tracheal Breath Support for Phonation: Mildly decreased Intelligibility: Unable to assess (comment) Respirations During Trial: 20 SpO2 During Trial:  (93-100) Pulse During Trial: 85 Behavior: Alert;Confused   Tracheostomy Tube       Vent Dependency  FiO2 (%): 28 %    Cuff Deflation Trial Tolerated Cuff Deflation: Yes Length of Time for Cuff Deflation Trial: 15 min Behavior: Alert;Confused   Royce Macadamia 03/11/2015, 3:53 PM    Breck Coons Julya Alioto M.Ed [employer] [phone]

## 2015-03-11 NOTE — Progress Notes (Signed)
Pt unable to perform nif and vc at this time.

## 2015-03-11 NOTE — Progress Notes (Signed)
Patient pulled out his NG tube. MD notified, order for SPL. For valve and swallow evaluation. Per SPL, patient  Fail PMSV evaluation and swallowing evaluation could only be attempted if patient was successful with PMSV. Attempts to reinsert NG tube was unsuccessful as patient was uncooperative and presence of trach presented difficulty. MD in the box notified of unsuccessful attempts.

## 2015-03-11 NOTE — Progress Notes (Signed)
eLink Physician-Brief Progress Note Patient Name: Lee Swanson DOB: 01-04-1951 MRN: [id number]   Date of Service  03/11/2015  HPI/Events of Note  Pt had trach.  He pulled out NG tube, and unable to be replaced.  Had speech eval >> recommend delaying oral medications/feedings.   eICU Interventions  Will change enteral meds to IV.  Hold norvasc, tube feeds, enteral hydralazine, enteral labetalol.  Will use IV hydralazine, labetalol, and/or lopressor to maintain SBP < 170.  Change protonix to IV fur SUP.  Increase D5W to 50 ml/hr.      Intervention Category Major Interventions: Other:  [name] 03/11/2015, 4:34 PM

## 2015-03-11 NOTE — Progress Notes (Signed)
PULMONARY / CRITICAL CARE MEDICINE   Name: Lee Swanson MRN: [id number] DOB: May 10, 1951    ADMISSION DATE:  02/26/2015    INITIAL PRESENTATION:  49  Male with hx of substance abuse, medical non compliance, HTN. CRD, suicidal  Ideation who was found down for unknown time, breathing, extremely hypertensive 250/155 and was transported to [facility] ED. CT scan with small SDH, started on nipride, intubated. NS consulted and MRI ordered. He was cocaine + on admit. PCCM admitted.  MAJOR EVENTS/TEST RESULTS: 6/11 admitted as above. 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 The differential diagnosis does include toxic demyelination, but that is felt less likely 6/12 renal US: Echogenic renal parenchyma bilaterally, compatible with medical renal disease. No hydronephrosis 6/12 Neurology consultation: PRES vs toxic leukoencephalopathy  6/14 More responsive. Fever, copious purulent secretions. Resp culture and empiric abx initiated. Precedex initiated 6/15 Tolerates PS 5-10 cm H2O. Poor mentation and copious ET secretions prohibited extubation 6/16 Failed extubation almost immediately due to poor handling of oropharyngeal secretions 6/17 Tolerates PS 5-10 cm H2O. + F/C 6/19 MRI brain repeat>>>Improved pres, Progressive infarcts with new infarcts noted involving portions ofthe posterior right frontal lobe, left periatrial region, right thalamus and right cerebellum with persistent acute/ subacute infarcts involving the left periatrial region, left thalamus and left cerebellum. Tiny amount hemorrhage associated with the left cerebellar infarct not entirely excluded. 6/22 trach, secretions  remain an issue 6/23 off vent , ready for tx to sdu and to triad service with PCCM as a consult  INDWELLING DEVICES: L femoral A-line 6/11 >> 6/14 ETT 6/11 >> 6/16>>>6/16>>>6/22 Trach 6/22(DF) >>> R [place] CVL 6/11 >>   MICRO DATA: MRSA PCR 6/11 >> NEG Resp 6/14 >> Klebsiella and enterobacter  ANTIMICROBIALS:  Vanc 6/14 >> 6/17 Ceftriaxone 6/14 >>  SUBJECTIVE:  Awake, tolerating t collar  VITAL SIGNS: Temp:  [98.6 F (37 C)-99.6 F (37.6 C)] 98.7 F (37.1 C) (06/24 0748) Pulse Rate:  [65-117] 89 (06/24 1000) Resp:  [10-18] 18 (06/24 1000) BP: (120-158)/(66-92) 158/87 mmHg (06/24 0800) SpO2:  [93 %-100 %] 100 % (06/24 1000) FiO2 (%):  [28 %-40 %] 28 % (06/24 0856) Weight:  [108 lb 14.5 oz (49.4 kg)] 108 lb 14.5 oz (49.4 kg) (06/24 0500) HEMODYNAMICS:   VENTILATOR SETTINGS: Vent Mode:  [-]  FiO2 (%):  [28 %-40 %] 28 % INTAKE / OUTPUT:  Intake/Output Summary (Last 24 hours) at 03/11/15 1014 Last data filed at 03/11/15 1000  Gross per 24 hour  Intake   1700 ml  Output   3200 ml  Net  -1500 ml    PHYSICAL EXAMINATION: General: Cachectic, RASS 0, follows commands. Moves rt side well, agiatated Neuro:RASS 0, weak on left unchanged. No follows commands HEENT: temporal wasting Cardiovascular: s1 s2 RRR Lungs: CTA  Abdomen: Soft, nondistended, +BS, feed tube currently out Ext: no edema  LABS:  CBC  Recent Labs Lab 03/06/15 0422 03/06/15 1420 03/07/15 0430 03/11/15 0600  WBC 12.5*  --  14.7* 14.8*  HGB 6.7* 9.3* 9.0* 9.5*  HCT 21.2* 28.7* 27.7* 29.6*  PLT 222  --  254 314   Coag's  Recent Labs Lab 03/08/15 0935  APTT 33  INR 1.03   BMET  Recent Labs Lab 03/09/15 0606  03/10/15 0445 03/11/15 0600  NA 141 146* 146*  K 4.1 3.9 3.9  CL 111 112* 108  CO2 22 23 26   BUN 78* 77* 80*  CREATININE 4.00* 3.99* 4.34*  GLUCOSE 92 129* 118*   Electrolytes  Recent Labs Lab 03/09/15 0606 03/10/15 0445 03/11/15 0600  CALCIUM 8.4* 8.9 8.8*  MG  --   1.9 1.9  PHOS  --  5.5* 3.6   Sepsis Markers No results for input(s): LATICACIDVEN, PROCALCITON, O2SATVEN in the last 168 hours. ABG No results for input(s): PHART, PCO2ART, PO2ART in the last 168 hours. Liver Enzymes No results for input(s): AST, ALT, ALKPHOS, BILITOT, ALBUMIN in the last 168 hours. Cardiac Enzymes No results for input(s): TROPONINI, PROBNP in the last 168 hours. Glucose  Recent Labs Lab 03/09/15 2357 03/10/15 0752 03/10/15 1611 03/11/15 0007 03/11/15 0346 03/11/15 0751  GLUCAP 92 140* 129* 108* 115* 122*    CXR: none new  ASSESSMENT / PLAN:  PULMONARY A: Acute VDRF due to AMS PNA Rt base VAP? Aspiration? P:   Attempt trach collar, goal 24 / 7(6/24 , 24 hrs t collar), given most important need was secretions, related to cva. As tolerated Pmv, cuff down   CARDIOVASCULAR A: Malignant hypertension, now controlled to some extent 1 week out, reduce further BP goals P:  Cont amlodipine Cont labetalol 300 tid, maxed as HR 60's Cont PRN hydralazine and metop Hydralazine keep same starting dose, sys 130 tele  RENAL Lab Results  Component Value Date   CREATININE 4.34* 03/11/2015   CREATININE 3.99* 03/10/2015   CREATININE 4.00* 03/09/2015    A:   AKI, now nonoliguric, Cr improving CKD stg 5, 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 Hypernatremia (ok in stroke pt) P:   Even balance goals to neg repeat , did well with this. No more lasix bmet in am  Avoid free water with cva noted   GASTROINTESTINAL A:   Hx of GERD/esophagitis Chronic PPI use Protein-calorie malnutrition P:   SUP:PPI , is home ,med, dc pepcid TF f he will leave FT in. NO PEG, hope can avoid>?  HEMATOLOGIC  Recent Labs  03/11/15 0600  HGB 9.5*    A:   Mild thrombocytopenia, resolved Anemia of acute illness/CKD without overt blood loss  P: DVT px: SCDs Sub q heparin  INFECTIOUS A:  GNR PNA (Klebs and enterobacter) ? aspiration P:   Stop date today abx, allow Wbc in am    ENDOCRINE CBG (last 3)   Recent Labs  03/11/15 0007 03/11/15 0346 03/11/15 0751  GLUCAP 108* 115* 122*     A:   Mild hyperglycemia without prior dx of DM P:   SSI if needed  NEUROLOGIC A:   Hypertensive encephalopathy(improved) now with agitation 6/24 PRES improved Multiple new strokes from cocaine, htn on adnmission Chronic micro hemophage Severe debilitation P:   RASS goal: 0 Low dose intermittent fent Trach collar PT following PMV daily Neuro following  FAMILY:  None available. Note, no family has made contact since his admission, social work helping  To sdu   Brett Canales Minor ACNP Adolph Pollack PCCM Pager [phone] till 3 pm If no answer page [phone] 03/11/2015, 10:14 AM    STAFF NOTE: I, Rory Percy, MD FACP have personally reviewed patient's available data, including medical history, events of note, physical examination and test results as part of my evaluation. I have discussed with resident/NP and other care providers such as pharmacist, RN and RRT. In addition, I personally evaluated patient and elicited key  findings of: alert, calm, no distress, no vent overnight greater 24 hrs, neg balance successful, same goals remain, chem in am , to transfer, triad service, pulm will follow int for trach care, PMV advance, swallow eval, as pulled NGT, if fail then replace and feed, unlikely to pass wth cva, TEE unable to perfrom, hydral for HTn control  Mcarthur Rossetti. Tyson Alias, MD, FACP Pgr: [phone] [facility] 03/11/2015 11:04 AM

## 2015-03-12 LAB — CBC
HCT: 29.2 % — ABNORMAL LOW (ref 39.0–52.0)
HEMOGLOBIN: 9.4 g/dL — AB (ref 13.0–17.0)
MCH: 29.2 pg (ref 26.0–34.0)
MCHC: 32.2 g/dL (ref 30.0–36.0)
MCV: 90.7 fL (ref 78.0–100.0)
Platelets: 310 10*3/uL (ref 150–400)
RBC: 3.22 MIL/uL — AB (ref 4.22–5.81)
RDW: 15 % (ref 11.5–15.5)
WBC: 13.8 10*3/uL — ABNORMAL HIGH (ref 4.0–10.5)

## 2015-03-12 LAB — PHOSPHORUS: PHOSPHORUS: 4.4 mg/dL (ref 2.5–4.6)

## 2015-03-12 LAB — BASIC METABOLIC PANEL
ANION GAP: 12 (ref 5–15)
BUN: 84 mg/dL — AB (ref 6–20)
CO2: 23 mmol/L (ref 22–32)
Calcium: 9 mg/dL (ref 8.9–10.3)
Chloride: 114 mmol/L — ABNORMAL HIGH (ref 101–111)
Creatinine, Ser: 4.3 mg/dL — ABNORMAL HIGH (ref 0.61–1.24)
GFR calc Af Amer: 15 mL/min — ABNORMAL LOW (ref >60)
GFR calc non Af Amer: 13 mL/min — ABNORMAL LOW (ref >60)
Glucose, Bld: 102 mg/dL — ABNORMAL HIGH (ref 65–99)
Potassium: 4.4 mmol/L (ref 3.5–5.1)
Sodium: 149 mmol/L — ABNORMAL HIGH (ref 135–145)

## 2015-03-12 LAB — GLUCOSE, CAPILLARY
GLUCOSE-CAPILLARY: 93 mg/dL (ref 65–99)
GLUCOSE-CAPILLARY: 98 mg/dL (ref 65–99)
Glucose-Capillary: 92 mg/dL (ref 65–99)

## 2015-03-12 LAB — MAGNESIUM: MAGNESIUM: 2 mg/dL (ref 1.7–2.4)

## 2015-03-12 MED ORDER — ACETAMINOPHEN 650 MG RE SUPP: 650.0000 mg | RECTAL | Status: DC | PRN

## 2015-03-12 MED ORDER — LABETALOL HCL 5 MG/ML IV SOLN
10.0000 mg | Freq: Four times a day (QID) | INTRAVENOUS | Status: DC
  Administered 2015-03-12 – 2015-03-16 (×14): 10 mg via INTRAVENOUS
  Filled 2015-03-12 (×22): qty 4

## 2015-03-12 MED ORDER — HYDRALAZINE HCL 20 MG/ML IJ SOLN
10.0000 mg | Freq: Four times a day (QID) | INTRAMUSCULAR | Status: DC
  Administered 2015-03-12 – 2015-03-15 (×13): 10 mg via INTRAVENOUS
  Filled 2015-03-12: qty 0.5
  Filled 2015-03-12 (×2): qty 1
  Filled 2015-03-12: qty 0.5
  Filled 2015-03-12: qty 1
  Filled 2015-03-12: qty 0.5
  Filled 2015-03-12 (×2): qty 1
  Filled 2015-03-12 (×3): qty 0.5
  Filled 2015-03-12: qty 1
  Filled 2015-03-12 (×2): qty 0.5
  Filled 2015-03-12: qty 1
  Filled 2015-03-12 (×4): qty 0.5
  Filled 2015-03-12: qty 1

## 2015-03-12 NOTE — Progress Notes (Signed)
E-Link MD made aware of progressive change in unequal pupils. Left pupil 24mm, Right pupil 19mm. Both reactive. No orders given at this time. Will continue to monitor.

## 2015-03-12 NOTE — Progress Notes (Signed)
Patient transported to 3S01, on 28% venturi mask, and connected to 28% trach collar upon arrival.  No complications during transport.

## 2015-03-12 NOTE — Progress Notes (Signed)
[place] TEAM 1 - Stepdown/ICU TEAM Progress Note  MAHMOUD MOHAR NZV:[id number] DOB: 09/05/1951 DOA: 02/26/2015 PCP: No primary care provider on file.  Admit HPI / Brief Narrative: 25 M with hx of substance abuse, medical non compliance, HTN, CRD, and suicidalideation who was found down for unknown time, breathing, extremely hypertensive 250/155, and was transported to [facility] ED. CT noted small SDH, started on nipride, intubated. NS consulted and MRI ordered. He was cocaine + on admit. PCCM admitted to ICU.    Significant Events: 6/11 Intubated in ED 6/11 CT head: Changes suggestive of a small subdural hematoma along the left lateral midbrain 6/11 MRI brain: Profound edema throughout the brainstem, also with involvement of the cerebellum and posterior aspects of the cerebral hemispheres, quite likely to represent PRES with predominant brainstem involvement. The differential diagnosis includes toxic demyelination, but that is felt less likely 6/12 renal US: Echogenic renal parenchyma bilaterally, compatible with medical renal disease. No hydronephrosis 6/12 Neurology consultation: PRES vs toxic leukoencephalopathy  6/14 More responsive. Fever, copious purulent secretions. Resp culture and empiric abx initiated. Precedex initiated 6/15 Tolerates PS 5-10 cm H2O. Poor mentation and copious ET secretions prohibited extubation 6/16 Failed extubation almost immediately due to poor handling of oropharyngeal secretions 6/17 Tolerates PS 5-10 cm H2O. + F/C 6/19 MRI brain repeat > Improved PRES, progressive infarcts with new infarcts noted involving portions of the posterior right frontal lobe, left periatrial region, right thalamus and right cerebellum with persistent acute/ subacute infarcts involving the left periatrial region, left thalamus and left cerebellum. Tiny amount hemorrhage associated with the left cerebellar infarct not entirely excluded. 6/22 trach, secretions remain an issue 6/23 off  vent , ready for tx to sdu - PCCM to cont to follow as a consult  HPI/Subjective: The patient has been agitated over the last 24 hours per note review.  He pulled out his NG tube.  He has made attempts to pull out his trach.  At the time of my visit the patient is somewhat agitated and fidgety.  He cannot provide a reliable history.  There is no family visiting in the room.  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/hypertension  VDRF due to altered mental status  Right basilar VAP versus aspiration pneumonia (Klebsiella and Enterobacter)  Malignant hypertension  Nonoliguric acute kidney injury with chronic stage V kidney disease Baseline creatinine approximately 3.8  Hypernatremia  Severe Protein calorie malnutrition in context of chronic illness Too agitated to allow MG at present time - SLP to reevaluate  Anemia of acute illness plus anemia of chronic kidney disease No evidence of significant overt blood loss  Mild hyperglycemia No prior history of diabetes  Severe physical debilitation  Code Status: FULL Family Communication: no family present at time of exam - per PCCM notes no family able to be contacted since admission Disposition Plan: SDU  Consultants: Neurology  NS  Antibiotics: None presently   DVT prophylaxis: SCDs  Objective: Blood pressure 170/100, pulse 84, temperature 98.6 F (37 C), temperature source Oral, resp. rate 18, height 5\' 8"  (1.727 m), weight 49.4 kg (108 lb 14.5 oz), SpO2 100 %.  Intake/Output Summary (Last 24 hours) at 03/12/15 1331 Last data filed at 03/12/15 1000  Gross per 24 hour  Intake    904 ml  Output   1815 ml  Net   -911 ml   Exam: General: No acute respiratory distress - agitated - confused Lungs: Clear to auscultation  bilaterally without wheezes or crackles Neck:  Trach in place on trach collar with some dried blood around the area of the trach but no  evidence of current active bleeding Cardiovascular: Regular rate and rhythm without murmur gallop or rub normal S1 and S2 Abdomen: Nontender, nondistended, soft, bowel sounds positive, no rebound, no ascites, no appreciable mass Extremities: No significant cyanosis, clubbing, or edema bilateral lower extremities  Data Reviewed: Basic Metabolic Panel:  Recent Labs Lab 03/07/15 0430 03/09/15 0606 03/10/15 0445 03/11/15 0600 03/12/15 0230  NA 143 141 146* 146* 149*  K 3.8 4.1 3.9 3.9 4.4  CL 114* 111 112* 108 114*  CO2 21* GLUCOSE 122* 92 129* 118* 102*  BUN 85* 78* 77* 80* 84*  CREATININE 3.89* 4.00* 3.99* 4.34* 4.30*  CALCIUM 8.0* 8.4* 8.9 8.8* 9.0  MG  --   --  1.9 1.9 2.0  PHOS  --   --  5.5* 3.6 4.4    CBC:  Recent Labs Lab 03/06/15 0422 03/06/15 1420 03/07/15 0430 03/11/15 0600 03/12/15 0230  WBC 12.5*  --  14.7* 14.8* 13.8*  HGB 6.7* 9.3* 9.0* 9.5* 9.4*  HCT 21.2* 28.7* 27.7* 29.6* 29.2*  MCV 91.4  --  88.5 89.7 90.7  PLT 222  --  254 314 310    Liver Function Tests: No results for input(s): AST, ALT, ALKPHOS, BILITOT, PROT, ALBUMIN in the last 168 hours. No results for input(s): LIPASE, AMYLASE in the last 168 hours. No results for input(s): AMMONIA in the last 168 hours.  Coags:  Recent Labs Lab 03/08/15 0935  INR 1.03    Recent Labs Lab 03/08/15 0935  APTT 33    CBG:  Recent Labs Lab 03/11/15 0346 03/11/15 0751 03/11/15 1539 03/11/15 2353 03/12/15 0812  GLUCAP 115* 122* 98 93 98    Studies:   Recent x-ray studies have been reviewed in detail by the Attending Physician  Scheduled Meds:  Scheduled Meds: . antiseptic oral rinse  7 mL Mouth Rinse QID  . chlorhexidine  15 mL Mouth Rinse BID  . heparin subcutaneous  5,000 Units Subcutaneous 3 times per day  . pantoprazole (PROTONIX) IV  40 mg Intravenous Q24H    Time spent on care of this patient: 35 mins   Forrester Blando T , MD   [facility] Office   [phone] Pager - Text Page per Loretha Stapler as per below:  On-Call/Text Page:      [email or web address]      password [email or web address]  If 7PM-7AM, please contact night-coverage [email or web address] Password [email or web address] 03/12/2015, 1:31 PM   LOS: 14 days

## 2015-03-12 NOTE — Progress Notes (Signed)
Patient unable to perform NIF and VC at this time. 

## 2015-03-13 DIAGNOSIS — I639 Cerebral infarction, unspecified: Secondary | ICD-10-CM

## 2015-03-13 DIAGNOSIS — I6340 Cerebral infarction due to embolism of unspecified cerebral artery: Secondary | ICD-10-CM

## 2015-03-13 LAB — COMPREHENSIVE METABOLIC PANEL
ALBUMIN: 2.3 g/dL — AB (ref 3.5–5.0)
ALK PHOS: 76 U/L (ref 38–126)
ALT: 22 U/L (ref 17–63)
AST: 29 U/L (ref 15–41)
Anion gap: 11 (ref 5–15)
BILIRUBIN TOTAL: 0.7 mg/dL (ref 0.3–1.2)
BUN: 77 mg/dL — ABNORMAL HIGH (ref 6–20)
CO2: 24 mmol/L (ref 22–32)
Calcium: 9.1 mg/dL (ref 8.9–10.3)
Chloride: 115 mmol/L — ABNORMAL HIGH (ref 101–111)
Creatinine, Ser: 4.27 mg/dL — ABNORMAL HIGH (ref 0.61–1.24)
GFR calc Af Amer: 16 mL/min — ABNORMAL LOW (ref >60)
GFR, EST NON AFRICAN AMERICAN: 13 mL/min — AB (ref >60)
Glucose, Bld: 107 mg/dL — ABNORMAL HIGH (ref 65–99)
Potassium: 3.8 mmol/L (ref 3.5–5.1)
Sodium: 150 mmol/L — ABNORMAL HIGH (ref 135–145)
TOTAL PROTEIN: 7 g/dL (ref 6.5–8.1)

## 2015-03-13 LAB — GLUCOSE, CAPILLARY
Glucose-Capillary: 100 mg/dL — ABNORMAL HIGH (ref 65–99)
Glucose-Capillary: 107 mg/dL — ABNORMAL HIGH (ref 65–99)
Glucose-Capillary: 97 mg/dL (ref 65–99)
Glucose-Capillary: 98 mg/dL (ref 65–99)

## 2015-03-13 LAB — CBC
HCT: 30.1 % — ABNORMAL LOW (ref 39.0–52.0)
Hemoglobin: 9.4 g/dL — ABNORMAL LOW (ref 13.0–17.0)
MCH: 28.3 pg (ref 26.0–34.0)
MCHC: 31.2 g/dL (ref 30.0–36.0)
MCV: 90.7 fL (ref 78.0–100.0)
Platelets: 330 10*3/uL (ref 150–400)
RBC: 3.32 MIL/uL — ABNORMAL LOW (ref 4.22–5.81)
RDW: 15 % (ref 11.5–15.5)
WBC: 11.2 10*3/uL — ABNORMAL HIGH (ref 4.0–10.5)

## 2015-03-13 LAB — PROTIME-INR
INR: 1.09 (ref 0.00–1.49)
Prothrombin Time: 14.3 seconds (ref 11.6–15.2)

## 2015-03-13 LAB — APTT: APTT: 35 s (ref 24–37)

## 2015-03-13 NOTE — Progress Notes (Signed)
PULMONARY / CRITICAL CARE MEDICINE   Name: Lee Swanson MRN: [id number] DOB: 1951-02-27    ADMISSION DATE:  02/26/2015    INITIAL PRESENTATION:  76  Male with hx of substance abuse, medical non compliance, HTN. CRD, suicidal  Ideation who was found down for unknown time, breathing, extremely hypertensive 250/155 and was transported to [facility] ED. CT scan with small SDH, started on nipride, intubated. NS consulted and MRI ordered. He was cocaine + on admit. PCCM admitted.  MAJOR EVENTS/TEST RESULTS: 6/11 admitted as above. 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 The differential diagnosis does include toxic demyelination, but that is felt less likely 6/12 renal US: Echogenic renal parenchyma bilaterally, compatible with medical renal disease. No hydronephrosis 6/12 Neurology consultation: PRES vs toxic leukoencephalopathy  6/14 More responsive. Fever, copious purulent secretions. Resp culture and empiric abx initiated. Precedex initiated 6/15 Tolerates PS 5-10 cm H2O. Poor mentation and copious ET secretions prohibited extubation 6/16 Failed extubation almost immediately due to poor handling of oropharyngeal secretions 6/17 Tolerates PS 5-10 cm H2O. + F/C 6/19 MRI brain repeat>>>Improved pres, Progressive infarcts with new infarcts noted involving portions ofthe posterior right frontal lobe, left periatrial region, right thalamus and right cerebellum with persistent acute/ subacute infarcts involving the left periatrial region, left thalamus and left cerebellum. Tiny amount hemorrhage associated with the left cerebellar infarct not entirely excluded. 6/22 trach, secretions  remain an issue 6/23 off vent , ready for tx to sdu and to [facility] with PCCM as a consult 6/25- delirium, pulled out ngt and attempted to pull out trach  INDWELLING DEVICES: L femoral A-line 6/11 >> 6/14 ETT 6/11 >> 6/16>>>6/16>>>6/22 Trach 6/22(DF) >>> R [place] CVL 6/11 >>   MICRO DATA: MRSA PCR 6/11 >> NEG Resp 6/14 >> Klebsiella and enterobacter  ANTIMICROBIALS:  Vanc 6/14 >> 6/17 Ceftriaxone 6/14 >>  SUBJECTIVE:  Awake, mild aggitation  VITAL SIGNS: Temp:  [97.3 F (36.3 C)-98.2 F (36.8 C)] 97.3 F (36.3 C) (06/26 1524) Pulse Rate:  [75-90] 81 (06/26 1524) Resp:  [15-20] 20 (06/26 1524) BP: (138-155)/(78-101) 138/101 mmHg (06/26 1524) SpO2:  [100 %] 100 % (06/26 1524) FiO2 (%):  [28 %] 28 % (06/26 1524) HEMODYNAMICS:   VENTILATOR SETTINGS: Vent Mode:  [-]  FiO2 (%):  [28 %] 28 % INTAKE / OUTPUT:  Intake/Output Summary (Last 24 hours) at 03/13/15 1602 Last data filed at 03/13/15 1500  Gross per 24 hour  Intake 1491.25 ml  Output   1350 ml  Net 141.25 ml    PHYSICAL EXAMINATION: General: Cachectic, RASS 0, follows commands. Neuro:RASS 0, weak on left improved HEENT: temporal wasting, trach with minimal blood around trach entry, old crusty blood on sutures Cardiovascular: s1 s2 RRR Lungs: CTA  Abdomen: Soft, nondistended, +BS Ext: no edema  LABS:  CBC  Recent Labs Lab 03/11/15 0600 03/12/15 0230 03/13/15 0235  WBC 14.8* 13.8* 11.2*  HGB 9.5* 9.4* 9.4*  HCT 29.6* 29.2* 30.1*  PLT 314 310 330   Coag's  Recent Labs Lab 03/08/15 0935  APTT 33  INR 1.03   BMET  Recent Labs Lab 03/11/15 0600 03/12/15 0230 03/13/15 0235  NA 146* 149* 150*  K 3.9 4.4 3.8  CL 108 114* 115*  CO2 26  23 24  BUN 80* 84* 77*  CREATININE 4.34* 4.30* 4.27*  GLUCOSE 118* 102* 107*   Electrolytes  Recent Labs Lab 03/10/15 0445 03/11/15 0600 03/12/15 0230 03/13/15 0235  CALCIUM 8.9 8.8* 9.0 9.1  MG 1.9 1.9 2.0  --   PHOS 5.5* 3.6 4.4  --     Sepsis Markers No results for input(s): LATICACIDVEN, PROCALCITON, O2SATVEN in the last 168 hours. ABG No results for input(s): PHART, PCO2ART, PO2ART in the last 168 hours. Liver Enzymes  Recent Labs Lab 03/13/15 0235  AST 29  ALT 22  ALKPHOS 76  BILITOT 0.7  ALBUMIN 2.3*   Cardiac Enzymes No results for input(s): TROPONINI, PROBNP in the last 168 hours. Glucose  Recent Labs Lab 03/11/15 1539 03/11/15 2353 03/12/15 0812 03/12/15 1658 03/12/15 2247 03/13/15 0818  GLUCAP 98 93 98 92 97 100*      ASSESSMENT / PLAN:  Patient Active Problem List   Diagnosis Date Noted  . Embolic cerebral infarction   . Acute respiratory failure   . Pneumonia, organism unspecified   . Acute respiratory failure, unspecified whether with hypoxia or hypercapnia   . Endotracheally intubated   . PRES (posterior reversible encephalopathy syndrome)   . Respiratory failure 02/26/2015  . SAH (subarachnoid hemorrhage) 02/26/2015  . Acute respiratory failure with hypoxemia 02/26/2015  . SDH (subdural hematoma) 02/26/2015  . Altered mental status   . Suicidal ideation   . Barrett's esophagus 02/05/2015  . Heme + stool   . SOB (shortness of breath)   . Alcohol-induced acute pancreatitis   . Hematemesis with nausea   . Protein-calorie malnutrition, severe 01/25/2015  . Hypertensive emergency 01/24/2015  . Hypokalemia 01/24/2015  . AKI (acute kidney injury) 01/24/2015  . CKD (chronic kidney disease) stage 4, GFR 15-29 ml/min 01/24/2015  . Thrombocytopenia 01/24/2015  . Cocaine abuse 01/24/2015  . LVH (left ventricular hypertrophy) 01/24/2015  . Abdominal pain 01/24/2015  . Nausea and vomiting 01/24/2015  . Weight loss 01/24/2015  . Tobacco abuse 01/24/2015  . Chest pain 01/24/2015  . Headache 01/24/2015  . Syncope 01/24/2015  . Elevated troponin 01/24/2015  . Enlarged prostate 01/24/2015      PULMONARY A: Acute VDRF due to AMS PNA Rt base VAP? Aspiration? S/p trach with  trauma by patient P:   Continued trach collar Assess pcxr in am for atx with bleeding noted The bleeding is from him pulling it after he pulled out his NGT also i will dc sutures (day 5) so i can clean the area with peroxide and then place a large gauze around the trach to help tamponade the minimal blood noted If this continued to ooze will place surgiseal around this soft tissue area Please note there were no significant arterial or anterior jug vein structures ligated in wound during case Check coags  Mcarthur Rossetti. Tyson Alias, MD, FACP Pgr: [phone]  Pulmonary & Critical Care

## 2015-03-13 NOTE — Progress Notes (Signed)
Pt continues to bleed significantly around trach and inside trach large amounts of bloody sputum trach care and cannula chaged 4 times this shift normal is one pt has strong cough will continue to monitor

## 2015-03-13 NOTE — Progress Notes (Signed)
[place] TEAM 1 - Stepdown/ICU TEAM Progress Note  AYDRIAN HALPIN ZOX:[id number] DOB: 08-17-1951 DOA: 02/26/2015 PCP: No primary care provider on file.  Admit HPI / Brief Narrative: 71 M with hx of substance abuse, medical non compliance, HTN, CRD, and suicidalideation who was found down for unknown time, breathing, extremely hypertensive 250/155, and was transported to [facility] ED. CT noted small SDH, started on nipride, intubated. NS consulted and MRI ordered. He was cocaine + on admit. PCCM admitted to ICU.    Significant Events: 6/11 Intubated in ED 6/11 CT head: Changes suggestive of a small subdural hematoma along the left lateral midbrain 6/11 MRI brain: Profound edema throughout the brainstem, also with involvement of the cerebellum and posterior aspects of the cerebral hemispheres, quite likely to represent PRES with predominant brainstem involvement. The differential diagnosis includes toxic demyelination, but that is felt less likely 6/12 renal US: Echogenic renal parenchyma bilaterally, compatible with medical renal disease. No hydronephrosis 6/12 Neurology consultation: PRES vs toxic leukoencephalopathy  6/14 More responsive. Fever, copious purulent secretions. Resp culture and empiric abx initiated. Precedex initiated 6/15 Tolerates PS 5-10 cm H2O. Poor mentation and copious ET secretions prohibited extubation 6/16 Failed extubation almost immediately due to poor handling of oropharyngeal secretions 6/17 Tolerates PS 5-10 cm H2O. + F/C 6/19 MRI brain repeat > Improved PRES, progressive infarcts with new infarcts noted involving portions of the posterior right frontal lobe, left periatrial region, right thalamus and right cerebellum with persistent acute/ subacute infarcts involving the left periatrial region, left thalamus and left cerebellum. Tiny amount hemorrhage associated with the left cerebellar infarct not entirely excluded. 6/22 trach, secretions remain an issue 6/23 off  vent, ready for tx to sdu - PCCM to cont to follow as a consult for trach care  6/25 - TRH assumed care   HPI/Subjective: Patient is alert today but is unable to speak.  It's difficult to determine if he is oriented but he does not appear to be understanding everything that I'm telling him.  He does follow some simple commands.  He continues to ask for water to our conversation.  The nurses/RT have appreciated significant blood loss at the site of his trach which has required frequent cleaning of his inner cannula today.  PCCM is to evaluate this this afternoon.  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/hypertension  VDRF due to altered mental status - S/P tracheostomy Resolved but patient still has tracheostomy - trach care per PCCM  Right basilar VAP versus aspiration pneumonia (Klebsiella and Enterobacter) Has completed a course of antibiotics therapy without any current symptoms to suggest infection  Nonoliguric acute kidney injury with chronic stage V kidney disease Baseline creatinine approximately 3.8 - creatinine appears to have stabilized at approximately 4.3  Hypernatremia Sodium holding stable at approximately 150 - slightly increase free water and follow trend  Severe Protein calorie malnutrition in context of chronic illness Too agitated to allow NG at present time - SLP to reevaluate in AM  Anemia of acute illness plus anemia of chronic kidney disease No evidence of significant overt blood loss, despite bleeding at trach  Mild hyperglycemia No prior history of diabetes - resolved  Severe physical debilitation  Code Status: FULL Family Communication: no family present at time of exam - per PCCM notes  no family  able to be contacted since admission Disposition Plan: SDU  Consultants: Neurology  NS  Antibiotics: None presently   DVT prophylaxis: SCDs  Objective: Blood pressure 138/101, pulse 81, temperature 97.3 F (36.3 C), temperature source Axillary, resp. rate 20, height 5\' 6"  (1.676 m), weight 45.6 kg (100 lb 8.5 oz), SpO2 100 %.  Intake/Output Summary (Last 24 hours) at 03/13/15 1539 Last data filed at 03/13/15 1500  Gross per 24 hour  Intake 1491.25 ml  Output   1350 ml  Net 141.25 ml   Exam: General: No acute respiratory distress - alert but communication difficult due to trach Lungs: Clear to auscultation bilaterally without wheezes or crackles Neck:  Trach in place on trach collar with small-volume fresh blood around the area of the trach  Cardiovascular: Regular rate and rhythm without murmur gallop or rub Abdomen: Nontender, nondistended, soft, bowel sounds positive, no rebound, no ascites, no appreciable mass Extremities: No significant cyanosis, clubbing, or edema bilateral lower extremities  Data Reviewed: Basic Metabolic Panel:  Recent Labs Lab 03/09/15 0606 03/10/15 0445 03/11/15 0600 03/12/15 0230 03/13/15 0235  NA 141 146* 146* 149* 150*  K 4.1 3.9 3.9 4.4 3.8  CL 111 112* 108 114* 115*  CO2 22 23 26 23 24   GLUCOSE 92 129* 118* 102* 107*  BUN 78* 77* 80* 84* 77*  CREATININE 4.00* 3.99* 4.34* 4.30* 4.27*  CALCIUM 8.4* 8.9 8.8* 9.0 9.1  MG  --  1.9 1.9 2.0  --   PHOS  --  5.5* 3.6 4.4  --    CBC:  Recent Labs Lab 03/07/15 0430 03/11/15 0600 03/12/15 0230 03/13/15 0235  WBC 14.7* 14.8* 13.8* 11.2*  HGB 9.0* 9.5* 9.4* 9.4*  HCT 27.7* 29.6* 29.2* 30.1*  MCV 88.5 89.7 90.7 90.7  PLT 254 314 310 330    Liver Function Tests:  Recent Labs Lab 03/13/15 0235  AST 29  ALT 22  ALKPHOS 76  BILITOT 0.7  PROT 7.0  ALBUMIN 2.3*   Coags:  Recent Labs Lab 03/08/15 0935  INR 1.03    Recent Labs Lab 03/08/15 0935  APTT 33     CBG:  Recent Labs Lab 03/11/15 2353 03/12/15 0812 03/12/15 1658 03/12/15 2247 03/13/15 0818  GLUCAP 93 98 92 97 100*    Studies:   Recent x-ray studies have been reviewed in detail by the Attending Physician  Scheduled Meds:  Scheduled Meds: . antiseptic oral rinse  7 mL Mouth Rinse QID  . chlorhexidine  15 mL Mouth Rinse BID  . heparin subcutaneous  5,000 Units Subcutaneous 3 times per day  . hydrALAZINE  10 mg Intravenous 4 times per day  . labetalol  10 mg Intravenous 4 times per day  . pantoprazole (PROTONIX) IV  40 mg Intravenous Q24H    Time spent on care of this patient: 35 mins   Marialice Newkirk T , MD   [facility] Office  [phone] Pager - Text Page per Loretha Stapler as per below:  On-Call/Text Page:      [email or web address]      password [email or web address]  If 7PM-7AM, please contact night-coverage [email or web address] Password [facility] 03/13/2015, 3:39 PM   LOS: 15 days

## 2015-03-14 ENCOUNTER — Inpatient Hospital Stay (HOSPITAL_COMMUNITY): Payer: Medicaid Other

## 2015-03-14 LAB — CBC
HEMATOCRIT: 31 % — AB (ref 39.0–52.0)
HEMOGLOBIN: 10.1 g/dL — AB (ref 13.0–17.0)
MCH: 29.7 pg (ref 26.0–34.0)
MCHC: 32.6 g/dL (ref 30.0–36.0)
MCV: 91.2 fL (ref 78.0–100.0)
PLATELETS: 310 10*3/uL (ref 150–400)
RBC: 3.4 MIL/uL — AB (ref 4.22–5.81)
RDW: 15.3 % (ref 11.5–15.5)
WBC: 9.5 10*3/uL (ref 4.0–10.5)

## 2015-03-14 LAB — BASIC METABOLIC PANEL
Anion gap: 9 (ref 5–15)
BUN: 71 mg/dL — ABNORMAL HIGH (ref 6–20)
CHLORIDE: 115 mmol/L — AB (ref 101–111)
CO2: 24 mmol/L (ref 22–32)
CREATININE: 4.16 mg/dL — AB (ref 0.61–1.24)
Calcium: 9 mg/dL (ref 8.9–10.3)
GFR calc non Af Amer: 14 mL/min — ABNORMAL LOW (ref >60)
GFR, EST AFRICAN AMERICAN: 16 mL/min — AB (ref >60)
Glucose, Bld: 100 mg/dL — ABNORMAL HIGH (ref 65–99)
Potassium: 4.1 mmol/L (ref 3.5–5.1)
SODIUM: 148 mmol/L — AB (ref 135–145)

## 2015-03-14 LAB — GLUCOSE, CAPILLARY
Glucose-Capillary: 99 mg/dL (ref 65–99)
Glucose-Capillary: 95 mg/dL (ref 65–99)

## 2015-03-14 LAB — VITAMIN B12: Vitamin B-12: 934 pg/mL — ABNORMAL HIGH (ref 180–914)

## 2015-03-14 MED ORDER — WHITE PETROLATUM GEL
Status: AC
  Administered 2015-03-14: 0.2
  Filled 2015-03-14: qty 1

## 2015-03-14 NOTE — Progress Notes (Signed)
Speech Language Pathology Treatment: Lee Swanson Speaking valve  Patient Details Name: Lee Swanson MRN: [id number] DOB: [birth year] Today's Date: 03/14/2015 Time: 9741-6384 SLP Time Calculation (min) (ACUTE ONLY): 22 min  Assessment / Plan / Recommendation Clinical Impression  Pt seen for trials of PMSV placement. SLP deflated cuff with immediate tracheal expectoration of thick, bloody mucous via strong cough. Despite efforts (verbal cueing, repeated trials deep oral suction and repositioning) the pt was unable to redirect air past 8 cuff deflated trach. Recommend downsize of trach when possible for tolerance of PMSV which will provide ability to cough and better protect airway for swallowing.    HPI     Pertinent Vitals  NA  SLP Plan  Continue with current plan of care    Recommendations Diet recommendations: NPO      Patient may use Passy-Muir Speech Valve: with SLP only       Oral Care Recommendations: Oral care QID Follow up Recommendations: Skilled Nursing facility Plan: Continue with current plan of care    GO    [facility], MA CCC-SLP [phone]  Lee Swanson 03/14/2015, 10:13 AM

## 2015-03-14 NOTE — Progress Notes (Signed)
[place] TEAM 1 - Stepdown/ICU TEAM Progress Note  Lee Swanson ZOX:[id number] DOB: 20-Oct-1950 DOA: 02/26/2015 PCP: No primary care provider on file.  Admit HPI / Brief Narrative: 52 M with hx of substance abuse, medical non compliance, HTN, CKD, and suicidalideation who was found down for unknown time, breathing, extremely hypertensive 250/155, and was transported to [facility] ED. CT noted small SDH, started on nipride, intubated. NS consulted and MRI ordered. He was cocaine + on admit. PCCM admitted to ICU.    Significant Events: 6/11 Intubated in ED 6/11 CT head: Changes suggestive of a small subdural hematoma along the left lateral midbrain 6/11 MRI brain: Profound edema throughout the brainstem, also with involvement of the cerebellum and posterior aspects of the cerebral hemispheres, quite likely to represent PRES with predominant brainstem involvement. The differential diagnosis includes toxic demyelination, but that is felt less likely 6/12 renal US: Echogenic renal parenchyma bilaterally, compatible with medical renal disease. No hydronephrosis 6/12 Neurology consultation: PRES vs toxic leukoencephalopathy  6/14 More responsive. Fever, copious purulent secretions. Resp culture and empiric abx initiated. Precedex initiated 6/15 Tolerates PS 5-10 cm H2O. Poor mentation and copious ET secretions prohibited extubation 6/16 Failed extubation almost immediately due to poor handling of oropharyngeal secretions 6/17 Tolerates PS 5-10 cm H2O. + F/C 6/19 MRI brain repeat > Improved PRES, progressive infarcts with new infarcts noted involving portions of the posterior right frontal lobe, left periatrial region, right thalamus and right cerebellum with persistent acute/ subacute infarcts involving the left periatrial region, left thalamus and left cerebellum. Tiny amount hemorrhage associated with the left cerebellar infarct not entirely excluded. 6/22 trach, secretions remain an issue 6/23 off  vent, ready for tx to sdu - PCCM to cont to follow as a consult for trach care  6/25 - TRH assumed care   HPI/Subjective: The patient appears more alert today.  At times he is able to communicate with me effectively and seemingly appropriately using head and eyes but then at other times he clearly does not understand the questions I'm posing to him.  He does not appear to be in acute distress nor does he appear uncomfortable at the present time.  He is fixated on being allowed to drink water.  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/hypertension  VDRF due to altered mental status - S/P tracheostomy Resolved but patient still has tracheostomy - trach care per PCCM - SLP suggest downsizing trach as patient unable to use PMV with current size 8  Right basilar VAP versus aspiration pneumonia (Klebsiella and Enterobacter) Has completed a course of antibiotics therapy without any current symptoms to suggest infection  Nonoliguric acute kidney injury with chronic stage V kidney disease Baseline creatinine approximately 3.8 - creatinine appears to have stabilized at approximately 4.2  Hypernatremia Sodium slightly improved with increase in free water - if unable to advance diet by 6/28 we'll have to consider replacing NG tube  Severe Protein calorie malnutrition in context of chronic illness If unable to advance diet by 6/28 we'll have to consider replacing NG tube  Anemia of acute illness plus anemia of chronic kidney disease No evidence of significant overt blood loss, despite bleeding at trach  Mild hyperglycemia No prior history of diabetes - resolved  Severe physical debilitation  Code Status: FULL  Family Communication: no  family present at time of exam - per PCCM notes no family able to be contacted since admission Disposition Plan: SDU  Consultants: Neurology  NS  Antibiotics: None presently   DVT prophylaxis: SCDs  Objective: Blood pressure 150/83, pulse 75, temperature 98.6 F (37 C), temperature source Oral, resp. rate 14, height 5\' 6"  (1.676 m), weight 45.6 kg (100 lb 8.5 oz), SpO2 100 %.  Intake/Output Summary (Last 24 hours) at 03/14/15 1210 Last data filed at 03/14/15 0800  Gross per 24 hour  Intake 1420.5 ml  Output   1325 ml  Net   95.5 ml   Exam: General: No acute respiratory distress - alert but communication difficult due to trach Lungs: Clear to auscultation bilaterally without wheezes / crackles Neck:  Trach in place on trach collar with no evidence of active bleeding presently Cardiovascular: Regular rate and rhythm without murmur gallop or rub Abdomen: Nontender, nondistended, soft, bowel sounds positive, no rebound, no ascites, no appreciable mass Extremities: No significant cyanosis, clubbing, edema bilateral lower extremities  Data Reviewed: Basic Metabolic Panel:  Recent Labs Lab 03/10/15 0445 03/11/15 0600 03/12/15 0230 03/13/15 0235 03/14/15 0220  NA 146* 146* 149* 150* 148*  K 3.9 3.9 4.4 3.8 4.1  CL 112* 108 114* 115* 115*  CO2 23 26 23 24 24   GLUCOSE 129* 118* 102* 107* 100*  BUN 77* 80* 84* 77* 71*  CREATININE 3.99* 4.34* 4.30* 4.27* 4.16*  CALCIUM 8.9 8.8* 9.0 9.1 9.0  MG 1.9 1.9 2.0  --   --   PHOS 5.5* 3.6 4.4  --   --    CBC:  Recent Labs Lab 03/11/15 0600 03/12/15 0230 03/13/15 0235 03/14/15 0220  WBC 14.8* 13.8* 11.2* 9.5  HGB 9.5* 9.4* 9.4* 10.1*  HCT 29.6* 29.2* 30.1* 31.0*  MCV 89.7 90.7 90.7 91.2  PLT 314 310 330 310    Liver Function Tests:  Recent Labs Lab 03/13/15 0235  AST 29  ALT 22  ALKPHOS 76  BILITOT 0.7  PROT 7.0  ALBUMIN 2.3*   Coags:  Recent Labs Lab 03/08/15 0935 03/13/15 1635  INR 1.03 1.09     Recent Labs Lab 03/08/15 0935 03/13/15 1635  APTT 33 35    CBG:  Recent Labs Lab 03/12/15 2247 03/13/15 0818 03/13/15 1532 03/13/15 2346 03/14/15 0754  GLUCAP 97 100* 107* 98 99    Studies:   Recent x-ray studies have been reviewed in detail by the Attending Physician  Scheduled Meds:  Scheduled Meds: . antiseptic oral rinse  7 mL Mouth Rinse QID  . chlorhexidine  15 mL Mouth Rinse BID  . hydrALAZINE  10 mg Intravenous 4 times per day  . labetalol  10 mg Intravenous 4 times per day  . pantoprazole (PROTONIX) IV  40 mg Intravenous Q24H    Time spent on care of this patient: 35 mins   MCCLUNG,JEFFREY T , MD   [facility] Office  [phone] Pager - Text Page per Loretha StaplerAmion as per below:  On-Call/Text Page:      [email or web address]      password [email or web address]  If 7PM-7AM, please contact night-coverage [email or web address] Password [email or web address] 03/14/2015, 12:10 PM   LOS: 16 days

## 2015-03-14 NOTE — Clinical Social Work Note (Signed)
Clinical Social Work Assessment  Patient Details  Name: Lee Swanson MRN: [id number] Date of Birth: August 17, 1951  Date of referral:  Friday 03/11/2015 (Pt was transferred to [facility] on Saturday).                Reason for consult:  Facility Placement                Housing/Transportation Living arrangements for the past 2 months:  [place] of Information:  Patient Patient Interpreter Needed:  None Criminal Activity/Legal Involvement Pertinent to Current Situation/Hospitalization:  No - Comment as needed Significant Relationships:  Adult Children (Pt does not want his daughter involve. ) Lives with:  Self Do you feel safe going back to the place where you live?  No Need for family participation in patient care:  No (Coment)  Care giving concerns:  N/A   Facilities manager / plan:  CSW met the pt at bedside. CSW introduced self and purpose of the visit. Pt was able to communicate by nodding his head yes or no to the questions CSW inquired about. Pt is open to going to a SNF. Pt confirmed that he does not want his daughter to be involved in his care. CSW explained that SNF placement may not be local. Pt indicated accepting that he maybe place outside of [facility]. CSW spoke with Dr. Reynaldo Minium about assisting with placement. As of now CSW will work with Dr. Reynaldo Minium and the case manager to determine if the pt is appropriate for SNF level of care.  CSW answered all questions in which the pt inquired about. CSW will continue to follow this pt and assist with discharge as needed.   Employment status:  Unemployed Forensic scientist:  Self Pay (Medicaid Pending) PT Recommendations:  Inpatient Rehab Consult Information / Referral to community resources:  [place]  Patient/Family's Response to care: The pt gave the CSW a thumbs up.   Patient/Family's Understanding of and Emotional Response to Diagnosis, Current Treatment, and Prognosis:  The pt could not  explained fully about his current treatment due to his trach.   Emotional Assessment Appearance:  Appears stated age Attitude/Demeanor/Rapport:   (Calm ) Affect (typically observed):  Appropriate Orientation:  Oriented to Self, Oriented to Place, Oriented to  Time, Oriented to Situation Alcohol / Substance use:  Not Applicable Psych involvement (Current and /or in the community):  No (Comment)  Discharge Needs  Concerns to be addressed:  Denies Needs/Concerns at this time Readmission within the last 30 days:  No Current discharge risk:  None Barriers to Discharge:  Inadequate or no insurance, Family Issues   Hamid Brookens, LCSW 03/14/2015, 3:28 PM

## 2015-03-14 NOTE — Progress Notes (Signed)
Physical Therapy Treatment Patient Details Name: Lee Swanson MRN: [id number] DOB: [birth year] Today's Date: 03/14/2015    History of Present Illness Pt is a 64 y/o male with a PMH of med noncompliance, HTN, tobacco/cocaine/ETOH, CKD, hep C, Barrett's esophagus. Pt [facility] after he was found down for an unknown period of time. BP was noted to be 250/155. An MRI was consistent with posterior reversible encephalopathy syndrome with profound edema throughout the brain stem and an acute left subarachnoid hemorrhage. A CT scan of the head showed a small left subdural hematoma. Neurosurgery was consulted although surgery was not felt to be indicated. Neurology has been following until MRI showed increasing strokes. He was transferred to the stroke team 03/08/2015 for continued followup.     PT Comments    Pt at this time requires 2 person A with mobility and for safety.  Pt perseverating on wanting water despite education on need to wait for SLP.  Continue to feel pt will need CIR at D/C to decrease burden of care.    Follow Up Recommendations  CIR     Equipment Recommendations  Wheelchair (measurements PT);Wheelchair cushion (measurements PT);Rolling walker with 5" wheels    Recommendations for Other Services       Precautions / Restrictions Precautions Precautions: Fall Restrictions Weight Bearing Restrictions: No    Mobility  Bed Mobility Overal bed mobility: Needs Assistance;+2 for physical assistance Bed Mobility: Supine to Sit;Sit to Supine     Supine to sit: Min assist;+2 for physical assistance;HOB elevated Sit to supine: Min assist;+2 for physical assistance   General bed mobility comments: pt participates in bed mobility, but needs cueing for sequencing and safe technique.    Transfers Overall transfer level: Needs assistance Equipment used: 2 person hand held assist Transfers: Sit to/from Stand Sit to Stand: Mod assist;+2 physical assistance          General transfer comment: pt needs A for positioning Bil LEs as he tends to scissor, cues for safe technique, and A for balance and lifting to stand.  Repeated transfer x2 for peri hygiene.    Ambulation/Gait                 Stairs            Wheelchair Mobility    Modified Rankin (Stroke Patients Only) Modified Rankin (Stroke Patients Only) Pre-Morbid Rankin Score: No significant disability (Per chart review, but not clear PLOF.) Modified Rankin: Severe disability     Balance Overall balance assessment: Needs assistance Sitting-balance support: Bilateral upper extremity supported;Feet supported Sitting balance-Leahy Scale: Poor   Postural control: Posterior lean;Right lateral lean Standing balance support: During functional activity Standing balance-Leahy Scale: Poor                      Cognition Arousal/Alertness: Awake/alert Behavior During Therapy: Flat affect Overall Cognitive Status: Difficult to assess                      Exercises      General Comments        Pertinent Vitals/Pain Pain Assessment: Faces Faces Pain Scale: Hurts a little bit Pain Location: pt very flat throughout session, unclear if any pain.   Pain Intervention(s): Monitored during session;Repositioned    Home Living                      Prior Function  PT Goals (current goals can now be found in the care plan section) Acute Rehab PT Goals Patient Stated Goal: pt perseverating on wanting water, not stating a goal.   PT Goal Formulation: Patient unable to participate in goal setting Time For Goal Achievement: 03/24/15 Potential to Achieve Goals: Good Progress towards PT goals: Progressing toward goals    Frequency  Min 3X/week    PT Plan Current plan remains appropriate    Co-evaluation             End of Session Equipment Utilized During Treatment: Oxygen Activity Tolerance: Patient tolerated treatment  well Patient left: in bed;with call bell/phone within reach;with bed alarm set     Time: 1610-9604 PT Time Calculation (min) (ACUTE ONLY): 23 min  Charges:  $Therapeutic Activity: 23-37 mins                    G CodesSunny Swanson, [place] [phone] 03/14/2015, 1:39 PM

## 2015-03-15 ENCOUNTER — Inpatient Hospital Stay (HOSPITAL_COMMUNITY): Payer: Medicaid Other

## 2015-03-15 DIAGNOSIS — I609 Nontraumatic subarachnoid hemorrhage, unspecified: Secondary | ICD-10-CM

## 2015-03-15 DIAGNOSIS — J95851 Ventilator associated pneumonia: Secondary | ICD-10-CM | POA: Diagnosis present

## 2015-03-15 DIAGNOSIS — I6200 Nontraumatic subdural hemorrhage, unspecified: Secondary | ICD-10-CM

## 2015-03-15 LAB — GLUCOSE, CAPILLARY
GLUCOSE-CAPILLARY: 94 mg/dL (ref 65–99)
GLUCOSE-CAPILLARY: 93 mg/dL (ref 65–99)
Glucose-Capillary: 92 mg/dL (ref 65–99)

## 2015-03-15 LAB — MAGNESIUM: Magnesium: 2.2 mg/dL (ref 1.7–2.4)

## 2015-03-15 LAB — COMPREHENSIVE METABOLIC PANEL
ALK PHOS: 70 U/L (ref 38–126)
ALT: 21 U/L (ref 17–63)
AST: 28 U/L (ref 15–41)
Albumin: 2.4 g/dL — ABNORMAL LOW (ref 3.5–5.0)
Anion gap: 9 (ref 5–15)
BILIRUBIN TOTAL: 0.9 mg/dL (ref 0.3–1.2)
BUN: 64 mg/dL — ABNORMAL HIGH (ref 6–20)
CALCIUM: 8.8 mg/dL — AB (ref 8.9–10.3)
CO2: 23 mmol/L (ref 22–32)
Chloride: 110 mmol/L (ref 101–111)
Creatinine, Ser: 4.05 mg/dL — ABNORMAL HIGH (ref 0.61–1.24)
GFR calc Af Amer: 17 mL/min — ABNORMAL LOW (ref >60)
GFR calc non Af Amer: 14 mL/min — ABNORMAL LOW (ref >60)
Glucose, Bld: 84 mg/dL (ref 65–99)
Potassium: 4 mmol/L (ref 3.5–5.1)
Sodium: 142 mmol/L (ref 135–145)
Total Protein: 7.7 g/dL (ref 6.5–8.1)

## 2015-03-15 LAB — PHOSPHORUS: Phosphorus: 4.5 mg/dL (ref 2.5–4.6)

## 2015-03-15 MED ORDER — HYDRALAZINE HCL 20 MG/ML IJ SOLN
15.0000 mg | Freq: Four times a day (QID) | INTRAMUSCULAR | Status: DC
  Administered 2015-03-16 (×2): 15 mg via INTRAVENOUS
  Filled 2015-03-15 (×4): qty 0.75

## 2015-03-15 NOTE — Consult Note (Signed)
Physical Medicine and Rehabilitation Consult  Reason for Consult:  PRES with bilateral embolic strokes Referring Physician: Dr. Sharon Seller.    HPI: Lee Swanson is a 64 y.o. male with history of CKD, HTN, Hep C, depression, suicidal ideation, cocaine abuse; who was admitted on 02/25/14 after being found down for unknown time. He was hypertensive with BP 250/155 and UDS positive for cocaine. He was intubated and started on nipride drip. CT head done revealing small SDH along left lateral midbrain with chronic ischemic changes. He was evaluated by Dr. Yetta Barre who recommended MRI to rule out aneurysm and tight BP control with goal < 180.  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   He developed fever due to Klebsiella/enterobacter VAP v/s aspiration PNA and was started on ceftriaxone and Vanc for treatment. He was unable to tolerate vent wean due to difficulty handling oral secretions and required trach on 06/22.  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 Follow up MRI 06/20 showed partial interval clearing of changes due to PRES and progressive infarcts with new infarcts noted involving portions of the posterior right frontal lobe, left periatrial region, right thalamus and right cerebellum with persistent acute/ subacute infarcts involving the left periatrial region, left thalamus and left cerebellum. Prior [facility] had cleared. Stroke work up initiated and 2D echo with EF 55-60% and no wall abnormality noted. He was extubated to T-collar and is tolerating PMSV trials. Is NPO with swallow evaluation pending downsize of trach.  Therapy ongoing and patient noted to be deconditioned with cognitive impairments. CIR  recommended to decrease burden of care.    Review of Systems  Unable to perform ROS: medical condition      Past Medical History  Diagnosis Date  . Chronic kidney disease     Stage IV  . Hypokalemia   . Thrombocytopenia   . Hypertension 01/2015.    Hypertensive emergency.  . Hepatitis C antibody test positive 01/25/2015    HIV testing negative on the same date.  . Elevated troponin I level 01/2015.    Felt to be secondary to demand ischemia in setting of hypertension and cocaine use. Cardiac cath not planned given kidney disease  . Vitamin D deficiency 01/2015.  . Barrett's esophagus 02/05/2015    EGD 01/2015 - anticipate repeat EGD 2019    Past Surgical History  Procedure Laterality Date  . Esophagogastroduodenoscopy N/A 01/27/2015    Procedure: ESOPHAGOGASTRODUODENOSCOPY (EGD);  Surgeon: Iva Boop, MD;  Location: [facility] ENDOSCOPY;  Service: Endoscopy;  Laterality: N/A;    History reviewed. No pertinent family history.    Social History:  Lives alone in a boarding house. Independent and was working till he was laid off 3-4 months ago. No local family. Per  reports that he has been smoking Cigarettes.  He has been smoking about 0.50 packs per day. He has never used smokeless tobacco. He reports that he drinks alcohol. He reports that he uses illicit drugs (Cocaine).    Allergies: No Known Allergies    Medications Prior to Admission  Medication Sig Dispense Refill  . aspirin EC 81 MG EC tablet Take 1 tablet (81 mg total) by mouth daily. 30 tablet 3  . carvedilol (COREG) 6.25 MG tablet Take 1 tablet (6.25 mg total) by mouth  2 (two) times daily with a meal. 60 tablet 3  . pantoprazole (PROTONIX) 40 MG tablet Take 1 tablet (40 mg total) by mouth 2 (two) times daily. 30 tablet 3  . sevelamer carbonate (RENVELA) 800 MG tablet Take 1 tablet (800 mg total) by mouth 3 (three) times daily with meals. 90 tablet 3  . verapamil (CALAN) 120 MG tablet Take 1 tablet (120 mg total) by mouth  every 8 (eight) hours. 90 tablet 3  . Vitamin D, Ergocalciferol, (DRISDOL) 50000 UNITS CAPS capsule Take 1 capsule (50,000 Units total) by mouth every 7 (seven) days. TAKE ONE PILL EVERY WEDNESDAY 7 capsule 0    Home: Home Living Family/patient expects to be discharged to:: Inpatient rehab Living Arrangements: Other (Comment) (Per previous admit, pt lives at a rooming house (?))  Functional History: Prior Function Comments: PLOF unknown at this time.  Functional Status:  Mobility: Bed Mobility Overal bed mobility: Needs Assistance, +2 for physical assistance Bed Mobility: Supine to Sit, Sit to Supine Rolling: Mod assist Supine to sit: Min assist, +2 for physical assistance, HOB elevated Sit to supine: Min assist, +2 for physical assistance General bed mobility comments: pt participates in bed mobility, but needs cueing for sequencing and safe technique.   Transfers Overall transfer level: Needs assistance Equipment used: 2 person hand held assist Transfers: Sit to/from Stand Sit to Stand: Mod assist, +2 physical assistance General transfer comment: pt needs A for positioning Bil LEs as he tends to scissor, cues for safe technique, and A for balance and lifting to stand.  Repeated transfer x2 for peri hygiene.        ADL:    Cognition: Cognition Overall Cognitive Status: Difficult to assess Orientation Level: Intubated/Tracheostomy - Unable to assess Cognition Arousal/Alertness: Awake/alert Behavior During Therapy: Flat affect Overall Cognitive Status: Difficult to assess Difficult to assess due to: Tracheostomy  Blood pressure 174/91, pulse 69, temperature 98.4 F (36.9 C), temperature source Oral, resp. rate 15, height 5\' 6"  (1.676 m), weight 45.6 kg (100 lb 8.5 oz), SpO2 100 %. Physical Exam  Constitutional:  Emaciated   HENT:  Trach with bloody secretions around site  Eyes: Pupils are equal, round, and reactive to light.  Neck: Normal range of motion.    Cardiovascular: Normal rate.   Respiratory: Effort normal.  GI: He exhibits no distension. There is no tenderness.  Musculoskeletal: He exhibits no edema.  Neurological:  Whispers words. Follows simple one step commands with cues. Initiates movement with all 4 limbs. Left hand in mitten. MMT inconsistent. Nods "yes" when asked if he feels pain during pinch on limbs.   Psychiatric:  A little restless. Non-agitated    Results for orders placed or performed during the hospital encounter of 02/26/15 (from the past 24 hour(s))  Glucose, capillary     Status: None   Collection Time: 03/14/15  4:20 PM  Result Value Ref Range   Glucose-Capillary 95 65 - 99 mg/dL   Comment 1 Notify RN    Comment 2 Document in Chart   Comprehensive metabolic panel     Status: Abnormal   Collection Time: 03/15/15  3:13 AM  Result Value Ref Range   Sodium 142 135 - 145 mmol/L   Potassium 4.0 3.5 - 5.1 mmol/L   Chloride 110 101 - 111 mmol/L   CO2 23 22 - 32 mmol/L   Glucose, Bld 84 65 - 99 mg/dL   BUN 64 (H) 6 - 20 mg/dL   Creatinine, Ser 1.61 (H) 0.61 -  1.24 mg/dL   Calcium 8.8 (L) 8.9 - 10.3 mg/dL   Total Protein 7.7 6.5 - 8.1 g/dL   Albumin 2.4 (L) 3.5 - 5.0 g/dL   AST 28 15 - 41 U/L   ALT 21 17 - 63 U/L   Alkaline Phosphatase 70 38 - 126 U/L   Total Bilirubin 0.9 0.3 - 1.2 mg/dL   GFR calc non Af Amer 14 (L) >60 mL/min   GFR calc Af Amer 17 (L) >60 mL/min   Anion gap 9 5 - 15  Magnesium     Status: None   Collection Time: 03/15/15  3:13 AM  Result Value Ref Range   Magnesium 2.2 1.7 - 2.4 mg/dL  Phosphorus     Status: None   Collection Time: 03/15/15  3:13 AM  Result Value Ref Range   Phosphorus 4.5 2.5 - 4.6 mg/dL   Dg Chest Port 1 View  03/14/2015   CLINICAL DATA:  Follow-up of atelectasis, history of acute and chronic renal insufficiency, respiratory failure and hypoxia, subdural hematoma.  EXAM: PORTABLE CHEST - 1 VIEW  COMPARISON:  Portable chest x-ray of March 09, 2015  FINDINGS: The  lungs are adequately inflated. Previously demonstrated pleural effusions are no longer evident. There is no pneumothorax or alveolar infiltrate. The heart and pulmonary vascularity are normal. The endotracheal tube tip lies approximately 1 cm below the inferior margin of the clavicular heads. The bony structures are unremarkable.  IMPRESSION: Improved appearance of both lungs. No acute cardiopulmonary abnormality is observed.   Electronically Signed   By: David  SwazilandJordan M.D.   On: 03/14/2015 07:40    Assessment/Plan: Diagnosis: ICH/PRES 1. Does the need for close, 24 hr/day medical supervision in concert with the patient's rehab needs make it unreasonable for this patient to be served in a less intensive setting? Yes 2. Co-Morbidities requiring supervision/potential complications: AKI/CKD, htn trach/pneumonia 3. Due to bladder management, bowel management, safety, skin/wound care, disease management, medication administration, pain management and patient education, does the patient require 24 hr/day rehab nursing? Yes 4. Does the patient require coordinated care of a physician, rehab nurse, PT (1-2 hrs/day, 5 days/week), OT (1-2 hrs/day, 5 days/week) and SLP (1-2 hrs/day, 5 days/week) to address physical and functional deficits in the context of the above medical diagnosis(es)? Yes Addressing deficits in the following areas: balance, endurance, locomotion, strength, transferring, bowel/bladder control, bathing, dressing, feeding, grooming, toileting, cognition, speech, language, swallowing and psychosocial support 5. Can the patient actively participate in an intensive therapy program of at least 3 hrs of therapy per day at least 5 days per week? Potentially 6. The potential for patient to make measurable gains while on inpatient rehab is good and fair 7. Anticipated functional outcomes upon discharge from inpatient rehab are supervision and min assist  with PT, supervision and min assist with OT,  supervision and min assist with SLP. 8. Estimated rehab length of stay to reach the above functional goals is: TBD 9. Does the patient have adequate social supports and living environment to accommodate these discharge functional goals? NO 10. Anticipated D/C setting: Other 11. Anticipated post D/C treatments: N/A 12. Overall Rehab/Functional Prognosis: good and fair  RECOMMENDATIONS: This patient's condition is appropriate for continued rehabilitative care in the following setting: see below Patient has agreed to participate in recommended program. N/A Note that insurance prior authorization may be required for reimbursement for recommended care.  Comment: Inpatient rehab is an option to help decrease the burden of care. The question really is:  who's burden of care are we decreasing? Would need to work with acute case management/hopsital in identifying a potential placement option after a proposed inpatient rehab admission.   Rehab Admissions Coordinator to follow up.  Thanks,  Ranelle Oyster, MD, [place] [facility]     03/15/2015

## 2015-03-15 NOTE — Progress Notes (Signed)
Speech Language Pathology Treatment:    Patient Details Name: Lee Swanson MRN: [id number] DOB: [birth year] Today's Date: 03/15/2015 Time: 1000-1025 SLP Time Calculation (min) (ACUTE ONLY): 25 min  Assessment / Plan / Recommendation Clinical Impression  Again despite repositioning, suction and max cues, pt unable to redirect any observable air to upper airway. Discussed with CCM PA, downsize unlikely until secretions improve. Will proceed with swallow assessment without tolerance of PMSV.    HPI Other Pertinent Information: 64 yr old with hx of substance abuse, medical non compliance, HTN. CRD, suicidal Ideation admitted after being found down for unknown time, extremely hypertensive 250/155 and intubated. Recieved trach 6/22, trach collar 6/23. Per MD note pt cocaine + on admit. 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 Repeat MRI progressive infarcts with new infarcts posterior right frontal, left parietal regin, right thalamus and bilateral cerebellum.  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 Acute left perimesencephalic subarachnoid hemorrhage.   Pertinent Vitals    SLP Plan  MBS    Recommendations        Patient may use Passy-Muir Speech Valve: with SLP only PMSV Supervision: Full MD: Please consider changing trach tube to : Smaller size;Cuffless       Oral Care Recommendations: Oral care QID Plan: MBS    GO     Lee Swanson, Lee NearingBonnie Swanson 03/15/2015, 11:24 AM

## 2015-03-15 NOTE — Progress Notes (Signed)
Pt NIF -28 and VC with good pt effort. RT will continue to monitor.

## 2015-03-15 NOTE — Progress Notes (Signed)
Inpatient Rehabilitation  I met with Lee Swanson at the bedside to discuss the possibility of IP Rehab when medically stable enough.  Pt. could recall that Dr. Naaman Plummer has seen him earlier in the am for a rehab consult.  It is unclear to me if pt. has ability to direct his care, however when asked on 3 occasions, he indicated to me that he permits phone calls to daughter Lee Swanson (listed in emergency contacts).  I have not reached out to either of his emergency contacts.    In conferring with the rehab team and with Dr.  Joya Salm, it is felt that the best post acute discharge disposition is a direct placement to SNF, bypassing IP Rehab.    I have updated Lee Swanson ,SW and Lee Swanson, CM of the above.  I will sign off.    Please call me if questions.  [place] Admissions Coordinator Cell [phone] Office [phone]

## 2015-03-15 NOTE — Progress Notes (Signed)
Patient NIF -20. VC 1000 mL. Good patient effort.

## 2015-03-15 NOTE — Progress Notes (Signed)
[place] TEAM 1 - Stepdown/ICU TEAM Progress Note  Lee Swanson ZOX:[id number] DOB: 08-09-1951 DOA: 02/26/2015 PCP: No primary care provider on file.  Admit HPI / Brief Narrative: 63 BM PMHx substance abuse, medical non compliance, HTN, CKD, and suicidalideation who was found down for unknown time, breathing, extremely hypertensive 250/155, and was [facility]. CT noted small SDH, started on nipride, intubated. NS consulted and MRI ordered. He was cocaine + on admit. PCCM admitted to ICU.   HPI/Subjective: 6/28 A/O 3 (does not know why), negative CP, negative SOB, negative N/V patient's concern is that he wants to eat.  Assessment/Plan: PRES - severe hypertensive encephalopathy -Encephalopathy improving   Malignant hypertension -Continue labetalol 10 mg QID  -Increase hydralazine 15 mg QID -Continue hydralazine PRN    Multiple acute strokes due to cocaine abuse/hypertension -Patient is 10 days out from last MR brain showing acute strokes  -Will continue to rule out permissive HTN; SBP goal 145 -See malignant hypertension  VDRF due to altered mental status - S/P tracheostomy -Resolved but patient still has tracheostomy - trach care per PCCM - SLP suggest downsizing trach as patient unable to use PMV with current size 8  Right basilar VAP versus aspiration pneumonia (Klebsiella and Enterobacter) -Has completed a course of antibiotics therapy without any current symptoms to suggest infection -Continues to have thick yellow sputum; NTS BID  Nonoliguric acute kidney injury with chronic stage V kidney disease(Baseline Cr~3.8)  - Cr stable ~4   Hypernatremia -Resolved  -Replace NG tube, patient failed swallow study  Severe Protein calorie malnutrition in context of chronic illness -If patient fails MBS tomorrow PEG tube will need to be placed   Anemia of acute illness plus anemia of chronic kidney disease -No evidence of significant overt blood loss, despite  bleeding at trach  Mild hyperglycemia -No prior history of diabetes - resolved  Severe physical debilitation   Code Status: FULL Family Communication: no family present at time of exam Disposition Plan: CIR vs LTAC vs vent SNF     Consultants: Dr.Daniel Daneil DanJ Feinstein [facility](PCC M)   Procedure/Significant Events:  6/11 Intubated in ED 6/11 CT head: Changes suggestive of a small subdural hematoma along the left lateral midbrain 6/11 MRI brain: Profound edema throughout the brainstem, also with involvement of the cerebellum and posterior aspects of the cerebral hemispheres, quite likely to represent PRES with predominant brainstem involvement. The differential diagnosis includes toxic demyelination, but that is felt less likely 6/12 renal US: Echogenic renal parenchyma bilaterally, compatible with medical renal disease. No hydronephrosis 6/12 Neurology consultation: PRES vs toxic leukoencephalopathy  6/14 More responsive. Fever, copious purulent secretions. Resp culture and empiric abx initiated. Precedex initiated 6/15 Tolerates PS 5-10 cm H2O. Poor mentation and copious ET secretions prohibited extubation 6/16 Failed extubation almost immediately due to poor handling of oropharyngeal secretions 6/17 Tolerates PS 5-10 cm H2O. + F/C 6/19 MRI brain repeat > Improved PRES, progressive infarcts with new infarcts noted involving portions of the posterior right frontal lobe, left periatrial region, right thalamus and right cerebellum with persistent acute/ subacute infarcts involving the left periatrial region, left thalamus and left cerebellum. Tiny amount hemorrhage associated with the left cerebellar infarct not entirely excluded. 6/22 trach, secretions remain an issue 6/23 off vent, ready for tx to sdu - PCCM to cont to follow as a consult for trach care  6/25 - TR[facility] assumed care    Culture   Antibiotics: NA  DVT prophylaxis: SCD   Devices  LINES / TUBES:      Continuous  Infusions: . dextrose 80 mL/hr at 03/15/15 0600    Objective: VITAL SIGNS: Temp: 98 F (36.7 C) (06/28 1909) Temp Source: Axillary (06/28 1909) BP: 159/92 mmHg (06/28 1222) Pulse Rate: 62 (06/28 1222) SPO2; FIO2:   Intake/Output Summary (Last 24 hours) at 03/15/15 2003 Last data filed at 03/15/15 1700  Gross per 24 hour  Intake   1920 ml  Output   1250 ml  Net    670 ml     Exam: General: A/O 3, (does not know why), NAD No acute respiratory distress Eyes: Negative headache, eye pain, double vision,negative retinal hemorrhage ENT: Negative Runny nose, negative ear pain, negative tinnitus, negative gingival bleeding Neck:  Negative scars, masses, torticollis, lymphadenopathy, JVD, trachea in place with copious thick blood-tinged mucus being coughed up my patient. Lungs: diffuse coarse breath sounds Clear to auscultation bilaterally without wheezes or crackles Cardiovascular: Regular rate and rhythm without murmur gallop or rub normal S1 and S2 Abdomen:negative abdominal pain, negative dysphagia, Nontender, nondistended, soft, bowel sounds positive, no rebound, no ascites, no appreciable mass Extremities: No significant cyanosis, clubbing, or edema bilateral lower extremities Psychiatric:  Negative depression, negative anxiety, negative fatigue, negative mania  Neurologic:  Cranial nerves II through XII intact, tongue/uvula midline, all extremities muscle strength 5/5, sensation intact throughout,  negative dysarthria, negative expressive aphasia, negative receptive aphasia.     Data Reviewed: Basic Metabolic Panel:  Recent Labs Lab 03/10/15 0445 03/11/15 0600 03/12/15 0230 03/13/15 0235 03/14/15 0220 03/15/15 0313  NA 146* 146* 149* 150* 148* 142  K 3.9 3.9 4.4 3.8 4.1 4.0  CL 112* 108 114* 115* 115* 110  CO2 GLUCOSE 129* 118* 102* 107* 100* 84  BUN 77* 80* 84* 77* 71* 64*  CREATININE 3.99* 4.34* 4.30* 4.27* 4.16* 4.05*  CALCIUM 8.9 8.8* 9.0  9.1 9.0 8.8*  MG 1.9 1.9 2.0  --   --  2.2  PHOS 5.5* 3.6 4.4  --   --  4.5   Liver Function Tests:  Recent Labs Lab 03/13/15 0235 03/15/15 0313  AST 29 28  ALT 22 21  ALKPHOS 76 70  BILITOT 0.7 0.9  PROT 7.0 7.7  ALBUMIN 2.3* 2.4*   No results for input(s): LIPASE, AMYLASE in the last 168 hours. No results for input(s): AMMONIA in the last 168 hours. CBC:  Recent Labs Lab 03/11/15 0600 03/12/15 0230 03/13/15 0235 03/14/15 0220  WBC 14.8* 13.8* 11.2* 9.5  HGB 9.5* 9.4* 9.4* 10.1*  HCT 29.6* 29.2* 30.1* 31.0*  MCV 89.7 90.7 90.7 91.2  PLT 314 310 330 310   Cardiac Enzymes: No results for input(s): CKTOTAL, CKMB, CKMBINDEX, TROPONINI in the last 168 hours. BNP (last 3 results) No results for input(s): BNP in the last 8760 hours.  ProBNP (last 3 results) No results for input(s): PROBNP in the last 8760 hours.  CBG:  Recent Labs Lab 03/13/15 2346 03/14/15 0754 03/14/15 1620 03/15/15 0729 03/15/15 1151  GLUCAP 98 99 95 92 94    No results found for this or any previous visit (from the past 240 hour(s)).   Studies:  Recent x-ray studies have been reviewed in detail by the Attending Physician  Scheduled Meds:  Scheduled Meds: . antiseptic oral rinse  7 mL Mouth Rinse QID  . chlorhexidine  15 mL Mouth Rinse BID  . [START ON 03/16/2015] hydrALAZINE  15 mg Intravenous 4 times per day  .  labetalol  10 mg Intravenous 4 times per day  . pantoprazole (PROTONIX) IV  40 mg Intravenous Q24H    Time spent on care of this patient: 40 mins   WOODS, Roselind Messier , MD  [facility] Office  [phone] Pager [phone]  On-Call/Text Page:      [email or web address]      password [email or web address]  If 7PM-7AM, please contact night-coverage [email or web address] Password [email or web address] 03/15/2015, 8:03 PM   LOS: 17 days   Care during the described time interval was provided by me .  I have reviewed this patient's available data, including medical history, events of note, physical  examination, and all test results as part of my evaluation. I have personally reviewed and interpreted all radiology studies.   Carolyne Littles, MD [phone] Pager

## 2015-03-15 NOTE — Progress Notes (Addendum)
UR COMPLETED  

## 2015-03-15 NOTE — Progress Notes (Signed)
MBSS complete. Full report located under chart review in imaging section. Daquon Greenleaf, MA CCC-SLP [phone]  

## 2015-03-15 NOTE — Evaluation (Signed)
Clinical/Bedside Swallow Evaluation Patient Details  Name: Lee Swanson MRN: [id number] Date of Birth: [birth year]  Today's Date: 03/15/2015 Time: SLP Start Time (ACUTE ONLY): 1000 SLP Stop Time (ACUTE ONLY): 1025 SLP Time Calculation (min) (ACUTE ONLY): 25 min  Past Medical History:  Past Medical History  Diagnosis Date  . Chronic kidney disease     Stage IV  . Hypokalemia   . Thrombocytopenia   . Hypertension 01/2015.    Hypertensive emergency.  . Hepatitis C antibody test positive 01/25/2015    HIV testing negative on the same date.  . Elevated troponin I level 01/2015.    Felt to be secondary to demand ischemia in setting of hypertension and cocaine use. Cardiac cath not planned given kidney disease  . Vitamin D deficiency 01/2015.  . Barrett's esophagus 02/05/2015    EGD 01/2015 - anticipate repeat EGD 2019   Past Surgical History:  Past Surgical History  Procedure Laterality Date  . Esophagogastroduodenoscopy N/A 01/27/2015    Procedure: ESOPHAGOGASTRODUODENOSCOPY (EGD);  Surgeon: Iva Boop, MD;  Location[facility] ENDOSCOPY;  Service: Endoscopy;  Laterality: N/A;   HPI:  64 yr old with hx of substance abuse, medical non compliance, HTN. CRD, suicidal Ideation admitted after being found down for unknown time, extremely hypertensive 250/155 and intubated. Recieved trach 6/22, trach collar 6/23. Per MD note pt cocaine + on admit. 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 Repeat MRI progressive infarcts with new infarcts posterior right frontal, left parietal regin, right thalamus and bilateral cerebellum.  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 Acute left perimesencephalic subarachnoid hemorrhage.   Assessment / Plan /  Recommendation Clinical Impression  Pt demonstrates potential for PO intake, though immediate signs of aspiration make ojective testing necessary. Pt would benefit from PMSV for safest PO intake, but given thick, bloody secretions, pt would likely benefit from more time prior to trach change. SLP will attempt MBS to determine if a modified diet may be tolerated even when pt not wearing PMSV.     Aspiration Risk  Severe    Diet Recommendation NPO        Other  Recommendations Oral Care Recommendations: Oral care QID   Follow Up Recommendations       Frequency and Duration        Pertinent Vitals/Pain NA    SLP Swallow Goals     Swallow Study Prior Functional Status       General Other Pertinent Information: 64 yr old with hx of substance abuse, medical non compliance, HTN. CRD, suicidal Ideation admitted after being found down for unknown time, extremely hypertensive 250/155 and intubated. Recieved trach 6/22, trach collar 6/23. Per MD note pt cocaine + on admit. 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 Repeat MRI progressive infarcts with new infarcts posterior right frontal, left parietal regin, right thalamus and bilateral cerebellum.  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 Acute left perimesencephalic subarachnoid hemorrhage. Type of Study: Bedside swallow evaluation Diet Prior to this Study: NPO Respiratory Status: Trach Trach Size and Type: #8;Deflated;Cuff History of Recent Intubation: No Behavior/Cognition: Alert;Cooperative Oral Cavity - Dentition: Adequate natural dentition/normal for age Self-Feeding Abilities: Needs  assist Patient Positioning: Upright in bed Baseline Vocal Quality: Aphonic Volitional Cough:  Strong Volitional Swallow: Unable to elicit    Oral/Motor/Sensory Function Overall Oral Motor/Sensory Function: Appears within functional limits for tasks assessed   Ice Chips Ice chips: Impaired Presentation: Spoon Oral Phase Functional Implications: Prolonged oral transit   Thin Liquid Thin Liquid: Impaired Pharyngeal  Phase Impairments: Cough - Delayed;Other (comments) (thinned secretions from trach)    Nectar Thick     Honey Thick     Puree     Solid   GO           Harlon DittyBonnie Shabazz Mckey, MA CCC-SLP [phone]  Claretha Townshend, Riley NearingBonnie Caroline 03/15/2015,11:21 AM

## 2015-03-16 ENCOUNTER — Inpatient Hospital Stay (HOSPITAL_COMMUNITY): Payer: Medicaid Other

## 2015-03-16 DIAGNOSIS — Z930 Tracheostomy status: Secondary | ICD-10-CM | POA: Diagnosis present

## 2015-03-16 DIAGNOSIS — I10 Essential (primary) hypertension: Secondary | ICD-10-CM | POA: Diagnosis present

## 2015-03-16 LAB — CBC WITH DIFFERENTIAL/PLATELET
Basophils Absolute: 0 10*3/uL (ref 0.0–0.1)
Basophils Relative: 0 % (ref 0–1)
EOS ABS: 0.1 10*3/uL (ref 0.0–0.7)
Eosinophils Relative: 1 % (ref 0–5)
HCT: 29.5 % — ABNORMAL LOW (ref 39.0–52.0)
Hemoglobin: 9.3 g/dL — ABNORMAL LOW (ref 13.0–17.0)
Lymphocytes Relative: 7 % — ABNORMAL LOW (ref 12–46)
Lymphs Abs: 0.7 10*3/uL (ref 0.7–4.0)
MCH: 28.3 pg (ref 26.0–34.0)
MCHC: 31.5 g/dL (ref 30.0–36.0)
MCV: 89.7 fL (ref 78.0–100.0)
MONOS PCT: 9 % (ref 3–12)
Monocytes Absolute: 0.9 10*3/uL (ref 0.1–1.0)
NEUTROS PCT: 83 % — AB (ref 43–77)
Neutro Abs: 8.1 10*3/uL — ABNORMAL HIGH (ref 1.7–7.7)
PLATELETS: 318 10*3/uL (ref 150–400)
RBC: 3.29 MIL/uL — ABNORMAL LOW (ref 4.22–5.81)
RDW: 14.7 % (ref 11.5–15.5)
WBC: 9.7 10*3/uL (ref 4.0–10.5)

## 2015-03-16 LAB — GLUCOSE, CAPILLARY
GLUCOSE-CAPILLARY: 95 mg/dL (ref 65–99)
Glucose-Capillary: 84 mg/dL (ref 65–99)
Glucose-Capillary: 101 mg/dL — ABNORMAL HIGH (ref 65–99)
Glucose-Capillary: 107 mg/dL — ABNORMAL HIGH (ref 65–99)

## 2015-03-16 LAB — MAGNESIUM: Magnesium: 2.1 mg/dL (ref 1.7–2.4)

## 2015-03-16 LAB — COMPREHENSIVE METABOLIC PANEL
ALK PHOS: 65 U/L (ref 38–126)
ALT: 20 U/L (ref 17–63)
AST: 25 U/L (ref 15–41)
Albumin: 2.4 g/dL — ABNORMAL LOW (ref 3.5–5.0)
Anion gap: 12 (ref 5–15)
BUN: 56 mg/dL — ABNORMAL HIGH (ref 6–20)
CALCIUM: 8.7 mg/dL — AB (ref 8.9–10.3)
CO2: 21 mmol/L — AB (ref 22–32)
Chloride: 106 mmol/L (ref 101–111)
Creatinine, Ser: 4.03 mg/dL — ABNORMAL HIGH (ref 0.61–1.24)
GFR calc non Af Amer: 14 mL/min — ABNORMAL LOW (ref >60)
GFR, EST AFRICAN AMERICAN: 17 mL/min — AB (ref >60)
Glucose, Bld: 88 mg/dL (ref 65–99)
Potassium: 4 mmol/L (ref 3.5–5.1)
Sodium: 139 mmol/L (ref 135–145)
Total Bilirubin: 0.8 mg/dL (ref 0.3–1.2)
Total Protein: 7.3 g/dL (ref 6.5–8.1)

## 2015-03-16 LAB — PROTIME-INR
INR: 1.19 (ref 0.00–1.49)
PROTHROMBIN TIME: 15.3 s — AB (ref 11.6–15.2)

## 2015-03-16 MED ORDER — VITAL HIGH PROTEIN PO LIQD: 1000.0000 mL | ORAL | Status: DC

## 2015-03-16 MED ORDER — JEVITY 1.2 CAL PO LIQD
1000.0000 mL | ORAL | Status: DC
  Administered 2015-03-16: 20 mL/h
  Administered 2015-03-17 – 2015-03-22 (×5): 1000 mL
  Filled 2015-03-16 (×2): qty 1000
  Filled 2015-03-16: qty 237
  Filled 2015-03-16 (×2): qty 1000
  Filled 2015-03-16: qty 237
  Filled 2015-03-16 (×9): qty 1000

## 2015-03-16 MED ORDER — LABETALOL HCL 200 MG PO TABS
200.0000 mg | ORAL_TABLET | Freq: Two times a day (BID) | ORAL | Status: DC
  Administered 2015-03-16 – 2015-03-24 (×18): 200 mg
  Filled 2015-03-16 (×20): qty 1

## 2015-03-16 MED ORDER — AMLODIPINE BESYLATE 10 MG PO TABS
10.0000 mg | ORAL_TABLET | Freq: Every day | ORAL | Status: DC
  Administered 2015-03-16 – 2015-03-24 (×9): 10 mg
  Filled 2015-03-16 (×10): qty 1

## 2015-03-16 MED ORDER — FREE WATER
200.0000 mL | Freq: Three times a day (TID) | Status: DC
  Administered 2015-03-16 – 2015-03-22 (×18): 200 mL

## 2015-03-16 MED ORDER — ACETAMINOPHEN 160 MG/5ML PO SOLN
650.0000 mg | Freq: Four times a day (QID) | ORAL | Status: DC | PRN
  Administered 2015-03-20: 650 mg
  Filled 2015-03-16: qty 20.3

## 2015-03-16 MED ORDER — HYDRALAZINE HCL 20 MG/ML IJ SOLN: 20.0000 mg | Freq: Four times a day (QID) | INTRAMUSCULAR | Status: DC

## 2015-03-16 NOTE — Progress Notes (Signed)
Pt trach was changed from an 8.0 to 6.0 cuffed shiley with cuff deflated per MD. Vitals remained stable throughout the procedure and pt tolerated well. Positive color change on CO2 detector and bilateral breath sounds noted. NIF -20 VC 1.4L. Respiratory therapist will continue to monitor.

## 2015-03-16 NOTE — Progress Notes (Signed)
[place] TEAM 1 - Stepdown/ICU TEAM Progress Note  Lee HONEYMAN Swanson:[id number] DOB: 10/29/1950 DOA: 02/26/2015 PCP: No primary care provider on file.  Admit HPI / Brief Narrative: 67 M with hx of substance abuse, medical non compliance, HTN, CKD, and suicidalideation who was found down for unknown time, breathing, extremely hypertensive 250/155, and was transported to [facility] ED. CT noted small SDH, started on nipride, intubated. NS consulted and MRI ordered. He was cocaine + on admit. PCCM admitted to ICU.    Significant Events: 6/11 Intubated in ED 6/11 CT head: Changes suggestive of a small subdural hematoma along the left lateral midbrain 6/11 MRI brain: Profound edema throughout the brainstem, also with involvement of the cerebellum and posterior aspects of the cerebral hemispheres, quite likely to represent PRES with predominant brainstem involvement. The differential diagnosis includes toxic demyelination, but that is felt less likely 6/12 renal US: Echogenic renal parenchyma bilaterally, compatible with medical renal disease. No hydronephrosis 6/12 Neurology consultation: PRES vs toxic leukoencephalopathy  6/14 More responsive. Fever, copious purulent secretions. Resp culture and empiric abx initiated. Precedex initiated 6/15 Tolerates PS 5-10 cm H2O. Poor mentation and copious ET secretions prohibited extubation 6/16 Failed extubation almost immediately due to poor handling of oropharyngeal secretions 6/17 Tolerates PS 5-10 cm H2O. + F/C 6/19 MRI brain repeat > Improved PRES, progressive infarcts with new infarcts noted involving portions of the posterior right frontal lobe, left periatrial region, right thalamus and right cerebellum with persistent acute/ subacute infarcts involving the left periatrial region, left thalamus and left cerebellum. Tiny amount hemorrhage associated with the left cerebellar infarct not entirely excluded. 6/22 trach, secretions remain an issue 6/23 off  vent, ready for tx to sdu - PCCM to cont to follow as a consult for trach care  6/25 - TRH assumed care   HPI/Subjective: The patient is alert but again is not able to speak and cannot communicate effectively using head nods, hand gestures, or mouthing of words.  Though he can follow some simple commands he is also pulling out medical equipment (NG tube) and attempting to manipulate his trach suggesting he does not fully comprehend his current situation.  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  VDRF due to altered mental status - S/P tracheostomy Resolved but patient still has tracheostomy - trach care per PCCM - SLP suggest downsizing trach as patient unable to use PMV with current size 8 - trach has not yet been changed/downsized  Right basilar VAP versus aspiration pneumonia (Klebsiella and Enterobacter) Has completed a course of antibiotics therapy without any current symptoms to suggest infection  Nonoliguric acute kidney injury with chronic stage V kidney disease Baseline creatinine approximately 3.8 - creatinine appears to have stabilized at approximately 4.0  Hypernatremia Sodium has normalized with free water administration via IV - transition to free water via feeding tube and follow  Severe Protein calorie malnutrition in context of chronic illness NG tube placed 628 which patient promptly removed himself early morning 6/29 - small bore feeding tube placed 6/29 - wrist restraints/mittens to be used as needed to protect feeding tube - begin tube feeds - keep in step down unit as patient will need very close  monitoring with  addition of tube feeding given his propensity to pull at his tube and the increased risk of aspiration due to this behavior  Anemia of acute illness plus anemia of chronic kidney disease No evidence of significant overt blood loss, despite oozing of blood at trach  Mild hyperglycemia No prior history of diabetes - resolved  Severe physical debilitation Given the patient's probable continued encephalopathy and his severe physical deconditioning SNF is clearly most appropriate discharge disposition at the present time  Code Status: FULL Family Communication: no family present at time of exam - per PCCM notes no family able to be contacted since admission Disposition Plan: SDU  Consultants: Neurology  NS PCCM  Antibiotics: None presently   DVT prophylaxis: SCDs  Objective: Blood pressure 153/98, pulse 79, temperature 98.7 F (37.1 C), temperature source Oral, resp. rate 13, height 5\' 6"  (1.676 m), weight 45.6 kg (100 lb 8.5 oz), SpO2 100 %.  Intake/Output Summary (Last 24 hours) at 03/16/15 1025 Last data filed at 03/16/15 1000  Gross per 24 hour  Intake   2240 ml  Output   1350 ml  Net    890 ml   Exam: General: No acute respiratory distress - alert but not able to effectively communicate Lungs: Clear to auscultation bilaterally without wheezes / crackles Neck:  Trach in place - trach collar oxygen delivery - crusted bloody secretions noted around trach Cardiovascular: Regular rate and rhythm without murmur gallop rub Abdomen: Nontender, nondistended, soft, bowel sounds positive, no rebound, no ascites, no appreciable mass Extremities: No significant cyanosis, clubbing, or edema bilateral lower extremities  Data Reviewed: Basic Metabolic Panel:  Recent Labs Lab 03/10/15 0445 03/11/15 0600 03/12/15 0230 03/13/15 0235 03/14/15 0220 03/15/15 0313 03/16/15 0225  NA 146* 146* 149* 150* 148* 142 139  K 3.9 3.9 4.4 3.8 4.1 4.0 4.0  CL 112* 108 114*  115* 115* 110 106  CO2 23 26 23 24 24 23  21*  GLUCOSE 129* 118* 102* 107* 100* 84 88  BUN 77* 80* 84* 77* 71* 64* 56*  CREATININE 3.99* 4.34* 4.30* 4.27* 4.16* 4.05* 4.03*  CALCIUM 8.9 8.8* 9.0 9.1 9.0 8.8* 8.7*  MG 1.9 1.9 2.0  --   --  2.2 2.1  PHOS 5.5* 3.6 4.4  --   --  4.5  --    CBC:  Recent Labs Lab 03/11/15 0600 03/12/15 0230 03/13/15 0235 03/14/15 0220 03/16/15 0225  WBC 14.8* 13.8* 11.2* 9.5 9.7  NEUTROABS  --   --   --   --  8.1*  HGB 9.5* 9.4* 9.4* 10.1* 9.3*  HCT 29.6* 29.2* 30.1* 31.0* 29.5*  MCV 89.7 90.7 90.7 91.2 89.7  PLT 314 310 330 310 318    Liver Function Tests:  Recent Labs Lab 03/13/15 0235 03/15/15 0313 03/16/15 0225  AST 29 28 25   ALT 22 21 20   ALKPHOS 76 70 65  BILITOT 0.7 0.9 0.8  PROT 7.0 7.7 7.3  ALBUMIN 2.3* 2.4* 2.4*   Coags:  Recent Labs Lab 03/13/15 1635 03/16/15 0225  INR 1.09 1.19    Recent Labs Lab 03/13/15 1635  APTT 35    CBG:  Recent Labs Lab 03/14/15 1620 03/15/15 0729 03/15/15 1151 03/15/15 2121 03/16/15 0816  GLUCAP 95 92 94 93 95    Studies:   Recent x-ray studies have been reviewed in detail by the Attending Physician  Scheduled Meds:  Scheduled Meds: . antiseptic oral rinse  7 mL Mouth Rinse QID  . chlorhexidine  15  mL Mouth Rinse BID  . hydrALAZINE  15 mg Intravenous 4 times per day  . labetalol  10 mg Intravenous 4 times per day  . pantoprazole (PROTONIX) IV  40 mg Intravenous Q24H    Time spent on care of this patient: 35 mins   MCCLUNG,JEFFREY T , MD   [facility] Office  [phone] Pager - Text Page per Loretha Stapler as per below:  On-Call/Text Page:      [email or web address]      password [email or web address]  If 7PM-7AM, please contact night-coverage [email or web address] Password [email or web address] 03/16/2015, 10:25 AM   LOS: 18 days

## 2015-03-16 NOTE — Progress Notes (Signed)
Clarified order about [place] tube placement. Spoke with Dr. Joseph ArtWoods he and gave order to place NG tube.

## 2015-03-16 NOTE — Care Management Note (Signed)
Case Management Note  Patient Details  Name: Lee Swanson MRN: [id number] Date of Birth: [birth year]  Subjective/Objective:                    Action/Plan:   Expected Discharge Date:                  Expected Discharge Plan:  Skilled Nursing Facility  In-House Referral:  Clinical Social Work  Discharge planning Services  CM Consult  Post Acute Care Choice:    Choice offered to:     DME Arranged:    DME Agency:     HH Arranged:    HH Agency:     Status of Service:  In process, will continue to follow  Medicare Important Message Given:    Date Medicare IM Given:    Medicare IM give by:    Date Additional Medicare IM Given:    Additional Medicare Important Message give by:     If discussed at Long Length of Stay Meetings, dates discussed:  03/14/15  Additional Comments:  Epifanio LeschesCole, Trevone Prestwood Hudson, RN 03/16/2015, 5:23 PM

## 2015-03-16 NOTE — Progress Notes (Signed)
PULMONARY / CRITICAL CARE MEDICINE   Name: Lee Swanson MRN: [id number] DOB: 1951/05/27    ADMISSION DATE:  02/26/2015    INITIAL PRESENTATION:  26  Male with hx of substance abuse, medical non compliance, HTN. CRD, suicidal  Ideation who was found down for unknown time, breathing, extremely hypertensive 250/155 and was transported to [facility] ED. CT scan with small SDH, started on nipride, intubated. NS consulted and MRI ordered. He was cocaine + on admit. PCCM admitted.  MAJOR EVENTS/TEST RESULTS: 6/11 admitted as above. 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 The differential diagnosis does include toxic demyelination, but that is felt less likely 6/12 renal US: Echogenic renal parenchyma bilaterally, compatible with medical renal disease. No hydronephrosis 6/12 Neurology consultation: PRES vs toxic leukoencephalopathy  6/14 More responsive. Fever, copious purulent secretions. Resp culture and empiric abx initiated. Precedex initiated 6/15 Tolerates PS 5-10 cm H2O. Poor mentation and copious ET secretions prohibited extubation 6/16 Failed extubation almost immediately due to poor handling of oropharyngeal secretions 6/17 Tolerates PS 5-10 cm H2O. + F/C 6/19 MRI brain repeat>>>Improved pres, Progressive infarcts with new infarcts noted involving portions ofthe posterior right frontal lobe, left periatrial region, right thalamus and right cerebellum with persistent acute/ subacute infarcts involving the left periatrial region, left thalamus and left cerebellum. Tiny amount hemorrhage associated with the left cerebellar infarct not entirely excluded. 6/22 trach, secretions  remain an issue 6/23 off vent , ready for tx to sdu and to [facility]ad service with PCCM as a consult 6/25- delirium, pulled out ngt and attempted to pull out trach  INDWELLING DEVICES: L femoral A-line 6/11 >> 6/14 ETT 6/11 >> 6/16>>>6/16>>>6/22 Trach 6/22(DF) >>> R [place] CVL 6/11 >>   MICRO DATA: MRSA PCR 6/11 >> NEG Resp 6/14 >> Klebsiella and enterobacter Sputum 6/29>>>  ANTIMICROBIALS:  Vanc 6/14 >> 6/17 Ceftriaxone 6/14 >>off  SUBJECTIVE:  Awake, mild aggitation  VITAL SIGNS: Temp:  [97.7 F (36.5 C)-99.4 F (37.4 C)] 98.7 F (37.1 C) (06/29 0752) Pulse Rate:  [3-79] 73 (06/29 0412) Resp:  [17-23] 19 (06/29 0113) BP: (153-177)/(87-98) 153/98 mmHg (06/29 0550) SpO2:  [97 %-100 %] 100 % (06/29 0752) FiO2 (%):  [28 %] 28 % (06/29 0752) HEMODYNAMICS:   VENTILATOR SETTINGS: Vent Mode:  [-]  FiO2 (%):  [28 %] 28 % INTAKE / OUTPUT:  Intake/Output Summary (Last 24 hours) at 03/16/15 0950 Last data filed at 03/16/15 0700  Gross per 24 hour  Intake   1440 ml  Output   1350 ml  Net     90 ml    PHYSICAL EXAMINATION: General: Cachectic, RASS 0, follows commands. Neuro:RASS 0, weak on left improved, no distress  HEENT: temporal wasting, trach with minimal blood around trach entry Cardiovascular: s1 s2 RRR Lungs:scattered rhonchi, streaky hemoptysis  Abdomen: Soft, nondistended, +BS Ext: no edema  LABS:   CBC  Recent Labs Lab 03/13/15 0235 03/14/15 0220 03/16/15 0225  WBC 11.2* 9.5 9.7  HGB 9.4* 10.1* 9.3*  HCT 30.1* 31.0* 29.5*  PLT 330 310 318   Coag's  Recent Labs Lab 03/13/15 1635 03/16/15 0225  APTT 35  --   INR 1.09 1.19   BMET  Recent Labs Lab 03/14/15 0220 03/15/15 0313 03/16/15 0225  NA  148* 142 139  K 4.1 4.0 4.0  CL 115* 110 106  CO2 24 23 21*  BUN 71* 64* 56*  CREATININE 4.16* 4.05* 4.03*  GLUCOSE 100* 84 88   Electrolytes  Recent Labs Lab 03/11/15 0600 03/12/15 0230  03/14/15 0220 03/15/15 0313 03/16/15 0225  CALCIUM  8.8* 9.0  < > 9.0 8.8* 8.7*  MG 1.9 2.0  --   --  2.2 2.1  PHOS 3.6 4.4  --   --  4.5  --   < > = values in this interval not displayed. Sepsis Markers No results for input(s): LATICACIDVEN, PROCALCITON, O2SATVEN in the last 168 hours. ABG No results for input(s): PHART, PCO2ART, PO2ART in the last 168 hours. Liver Enzymes  Recent Labs Lab 03/13/15 0235 03/15/15 0313 03/16/15 0225  AST 29 28 25   ALT 22 21 20   ALKPHOS 76 70 65  BILITOT 0.7 0.9 0.8  ALBUMIN 2.3* 2.4* 2.4*   Cardiac Enzymes No results for input(s): TROPONINI, PROBNP in the last 168 hours. 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 Suspect that he may be able to phonate some and possibly do a little better w/ SLP efforts after trach size decreased.  Check sputum culture  He will f/u with us in trach clinic after d/c, need to assure that his trach is switched to cuffless before he is moved out of the hospital   All other issues per IM service  HTN, hyperglycemia, physical deconditioning, dysphagia, stage V CKD   Simonne MartinetPeter E Babcock ACNP-BC [facility] Pulmonary/Critical Care Pager # [phone] OR # [phone] if no answer   PCCM ATTENDING: I have reviewed pt's initial presentation, consultants notes and hospital database in detail.  The above assessment and plan was formulated under my direction.  In summary: We are managing trach and respiratory issues I have requested  change of trach tube to #6 Shiley in hopes that this will allow for better phonation He has had a nasal Panda tube placed. Will change meds to per tube (and I added an anti-hypertensive regimen) He has copious mucoid secretions from trach tube. A resp culture has been ordered   Billy Fischeravid Megean Fabio, MD;  PCCM service; Mobile [phone]

## 2015-03-16 NOTE — Progress Notes (Signed)
Physical Therapy Treatment Patient Details Name: Lee Swanson R Bayless MRN: [id number] DOB: 09/12/1951 Today's Date: 03/16/2015    History of Present Illness Pt is a 64 y/o male with a PMH of med noncompliance, HTN, tobacco/cocaine/ETOH, CKD, hep C, Barrett's esophagus. [facility] after he was found down for an unknown period of time. BP was noted to be 250/155. An MRI was consistent with posterior reversible encephalopathy syndrome with profound edema throughout the brain stem and an acute left subarachnoid hemorrhage. A CT scan of the head showed a small left subdural hematoma. Neurosurgery was consulted although surgery was not felt to be indicated. Neurology has been following until MRI showed increasing strokes. He was transferred to the stroke team 03/08/2015 for continued followup.     PT Comments    Progressing towards physical therapy goals. Continues to require +2 physical assist for transfers. Focused on sitting and standing balance today, as well as transfer training. Eager to work with therapy and tolerated therapeutic exercises well. Weakness of LLE however able to move against gravity. Patient will continue to benefit from skilled physical therapy services to further improve independence with functional mobility.   Follow Up Recommendations  LTACH (versus Vent capable SNF)     Equipment Recommendations  Wheelchair (measurements PT);Wheelchair cushion (measurements PT);Rolling walker with 5" wheels    Recommendations for Other Services       Precautions / Restrictions Precautions Precautions: Fall Restrictions Weight Bearing Restrictions: No    Mobility  Bed Mobility Overal bed mobility: Needs Assistance;+2 for physical assistance Bed Mobility: Supine to Sit     Supine to sit: HOB elevated;Mod assist;+2 for safety/equipment     General bed mobility comments: Mod assist for trucal support. +2 for safety. Encouraged use of rail. VC for techniques  throughout  Transfers Overall transfer level: Needs assistance Equipment used: 2 person hand held assist Transfers: Sit to/from [employer] CorporationStand;Stand Pivot Transfers Sit to Stand: Mod assist;+2 physical assistance Stand pivot transfers: Mod assist;+2 physical assistance       General transfer comment: Stood x2 with +2 mod assist for boost from lowest bed setting. Stood x 2 minutes first attempt. Unable to take steps towards chair. Second trial pt able to pivot with mod assist for balance, VC for sequencing. Poor control of LLE requiring block from knee.  Ambulation/Gait                 Stairs            Wheelchair Mobility    Modified Rankin (Stroke Patients Only) Modified Rankin (Stroke Patients Only) Pre-Morbid Rankin Score: No significant disability (Per chart review, but not clear PLOF.) Modified Rankin: Severe disability     Balance Overall balance assessment: Needs assistance Sitting-balance support: Bilateral upper extremity supported;Feet supported Sitting balance-Leahy Scale: Poor Sitting balance - Comments: Sat edge of bed x 6 minutes focusing on upright posture and truck control. Searching for mid line and correction. Leans heavily to posterior at times. Postural control: Posterior lean Standing balance support: Bilateral upper extremity supported Standing balance-Leahy Scale: Poor Standing balance comment: Practiced marching, Required LLE knee block to bear majority of weight through LLE unable to lift RLE. Good stability in Rt stance able to march LLE off of ground several times with UE support for balance.                    Cognition Arousal/Alertness: Awake/alert Behavior During Therapy: Flat affect Overall Cognitive Status: Difficult to assess  Exercises General Exercises - Lower Extremity Ankle Circles/Pumps: Both;10 reps;Seated;AAROM Long Arc Quad: Strengthening;Both;10 reps;Seated;AAROM Hip Flexion/Marching:  AAROM;Strengthening;Both;10 reps;Seated    General Comments        Pertinent Vitals/Pain Pain Assessment: Faces Faces Pain Scale: Hurts a little bit Pain Location: trach Pain Descriptors / Indicators: Grimacing Pain Intervention(s): Monitored during session;Repositioned    Home Living                      Prior Function            PT Goals (current goals can now be found in the care plan section) Acute Rehab PT Goals Patient Stated Goal: none stated PT Goal Formulation: Patient unable to participate in goal setting Time For Goal Achievement: 03/24/15 Potential to Achieve Goals: Good Progress towards PT goals: Progressing toward goals    Frequency  Min 3X/week    PT Plan Discharge plan needs to be updated    Co-evaluation             End of Session Equipment Utilized During Treatment: Oxygen Activity Tolerance: Patient tolerated treatment well Patient left: with call bell/phone within reach;in chair;with restraints reapplied     Time: 1429-1500 PT Time Calculation (min) (ACUTE ONLY): 31 min  Charges:  $Therapeutic Exercise: 8-22 mins $Therapeutic Activity: 8-22 mins                    G Codes:      Berton Mount 07-Apr-2015, 4:37 PM [facility] [place], [place] [phone]

## 2015-03-17 ENCOUNTER — Inpatient Hospital Stay (HOSPITAL_COMMUNITY): Payer: Medicaid Other

## 2015-03-17 DIAGNOSIS — N189 Chronic kidney disease, unspecified: Secondary | ICD-10-CM

## 2015-03-17 DIAGNOSIS — Z930 Tracheostomy status: Secondary | ICD-10-CM | POA: Diagnosis present

## 2015-03-17 DIAGNOSIS — J95851 Ventilator associated pneumonia: Secondary | ICD-10-CM | POA: Diagnosis present

## 2015-03-17 DIAGNOSIS — N179 Acute kidney failure, unspecified: Secondary | ICD-10-CM | POA: Diagnosis present

## 2015-03-17 DIAGNOSIS — E43 Unspecified severe protein-calorie malnutrition: Secondary | ICD-10-CM

## 2015-03-17 DIAGNOSIS — J690 Pneumonitis due to inhalation of food and vomit: Secondary | ICD-10-CM | POA: Diagnosis present

## 2015-03-17 DIAGNOSIS — E870 Hyperosmolality and hypernatremia: Secondary | ICD-10-CM | POA: Diagnosis present

## 2015-03-17 DIAGNOSIS — D638 Anemia in other chronic diseases classified elsewhere: Secondary | ICD-10-CM | POA: Diagnosis present

## 2015-03-17 DIAGNOSIS — I10 Essential (primary) hypertension: Secondary | ICD-10-CM | POA: Diagnosis present

## 2015-03-17 LAB — COMPREHENSIVE METABOLIC PANEL
ALT: 21 U/L (ref 17–63)
ANION GAP: 9 (ref 5–15)
AST: 27 U/L (ref 15–41)
Albumin: 2.4 g/dL — ABNORMAL LOW (ref 3.5–5.0)
Alkaline Phosphatase: 66 U/L (ref 38–126)
BILIRUBIN TOTAL: 0.6 mg/dL (ref 0.3–1.2)
BUN: 51 mg/dL — AB (ref 6–20)
CALCIUM: 8.6 mg/dL — AB (ref 8.9–10.3)
CO2: 22 mmol/L (ref 22–32)
CREATININE: 3.9 mg/dL — AB (ref 0.61–1.24)
Chloride: 105 mmol/L (ref 101–111)
GFR, EST AFRICAN AMERICAN: 17 mL/min — AB (ref >60)
GFR, EST NON AFRICAN AMERICAN: 15 mL/min — AB (ref >60)
Glucose, Bld: 107 mg/dL — ABNORMAL HIGH (ref 65–99)
POTASSIUM: 4.3 mmol/L (ref 3.5–5.1)
SODIUM: 136 mmol/L (ref 135–145)
Total Protein: 7.2 g/dL (ref 6.5–8.1)

## 2015-03-17 LAB — CBC
HCT: 27.3 % — ABNORMAL LOW (ref 39.0–52.0)
HEMOGLOBIN: 8.7 g/dL — AB (ref 13.0–17.0)
MCH: 28.2 pg (ref 26.0–34.0)
MCHC: 31.9 g/dL (ref 30.0–36.0)
MCV: 88.3 fL (ref 78.0–100.0)
Platelets: 319 10*3/uL (ref 150–400)
RBC: 3.09 MIL/uL — AB (ref 4.22–5.81)
RDW: 14.3 % (ref 11.5–15.5)
WBC: 14.1 10*3/uL — AB (ref 4.0–10.5)

## 2015-03-17 LAB — GLUCOSE, CAPILLARY
GLUCOSE-CAPILLARY: 109 mg/dL — AB (ref 65–99)
GLUCOSE-CAPILLARY: 122 mg/dL — AB (ref 65–99)
GLUCOSE-CAPILLARY: 105 mg/dL — AB (ref 65–99)
GLUCOSE-CAPILLARY: 102 mg/dL — AB (ref 65–99)
Glucose-Capillary: 108 mg/dL — ABNORMAL HIGH (ref 65–99)
Glucose-Capillary: 113 mg/dL — ABNORMAL HIGH (ref 65–99)
Glucose-Capillary: 147 mg/dL — ABNORMAL HIGH (ref 65–99)

## 2015-03-17 NOTE — Progress Notes (Signed)
[facility].  Emergency equipment at bedside   Patients RN notified to order #6 Shiley cuffed trach for bedside.

## 2015-03-17 NOTE — Progress Notes (Signed)
Removal of patients restraints at 1534 sitter at bedside.  Patient is alert and following commands at this time.  Will continue to monitor.

## 2015-03-17 NOTE — Progress Notes (Signed)
[facility] TEAM 1 - Stepdown/ICU TEAM Progress Note  Armandina Stammerreston R Ditmars ZOX:[id number] DOB: 17-Jul-1951 DOA: 02/26/2015 PCP: No primary care provider on file.  Admit HPI / Brief Narrative: 63 BM PMHx substance abuse, medical non compliance, HTN, CKD, and suicidalideation who was found down for unknown time, breathing, extremely hypertensive 250/155, and [facility] CT noted small SDH, started on nipride, intubated. NS consulted and MRI ordered. He was cocaine + on admit. PCCM admitted to ICU.   HPI/Subjective: 6/30 A/O 1 (does not know why), negative CP, negative SOB, negative N/V.    Assessment/Plan: PRES - severe hypertensive encephalopathy - Encephalopathy worse today. If mental status declined overnight would consider obtaining head CT/MRI, extension of stroke?   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 -Would give patient another 72 hours, reattempt swallow study and if patient fails immediately request PEG tube placement  Right basilar VAP versus aspiration pneumonia (Klebsiella and Enterobacter) -Has completed a course of antibiotics therapy without any current symptoms to suggest infection -Continues to have thick yellow sputum; NTS DAILY  Nonoliguric acute kidney injury with chronic stage V kidney disease(Baseline Cr~3.8)  - Cr stable ~4   Hypernatremia -Resolved   Severe Protein calorie malnutrition in context of  chronic illness -Patient obtaining nutrition through NG tube  -If patient fails MBS in 72 hours PEG tube will be placed   Anemia of acute illness plus anemia of chronic kidney disease -No evidence of significant overt blood loss, despite bleeding at trach  Mild hyperglycemia -No prior history of diabetes - resolved  Severe physical debilitation   Code Status: FULL Family Communication: no family present at time of exam Disposition Plan: CIR vs LTAC vs vent SNF     Consultants: Dr.Daniel Daneil DanJ Feinstein [facility](PCC M)   Procedure/Significant Events:  6/11 Intubated in ED 6/11 CT head: Changes suggestive of a small subdural hematoma along the left lateral midbrain 6/11 MRI brain: Profound edema throughout the brainstem, also with involvement of the cerebellum and posterior aspects of the cerebral hemispheres, quite likely to represent PRES with predominant brainstem involvement. The differential diagnosis includes toxic demyelination, but that is felt less likely 6/12 renal US: Echogenic renal parenchyma bilaterally, compatible with medical renal disease. No hydronephrosis 6/12 Neurology consultation: PRES vs toxic leukoencephalopathy  6/14 More responsive. Fever, copious purulent secretions. Resp culture and empiric abx initiated. Precedex initiated 6/15 Tolerates PS 5-10 cm H2O. Poor mentation and copious ET secretions prohibited extubation 6/16 Failed extubation almost immediately due to poor handling of oropharyngeal secretions 6/17 Tolerates PS 5-10 cm H2O. + F/C 6/19 MRI brain repeat > Improved PRES, progressive infarcts with new infarcts noted involving portions of the posterior right frontal lobe, left periatrial region, right thalamus and right cerebellum with persistent acute/ subacute infarcts involving the left periatrial region, left thalamus and left cerebellum. Tiny amount hemorrhage associated with the left cerebellar infarct not entirely excluded. 6/22 trach, secretions remain  an issue 6/23 off vent, ready for  tx to sdu - PCCM to cont to follow as a consult for trach care  6/25 - TR[facility] assumed care    Culture   Antibiotics: NA  DVT prophylaxis: SCD   Devices    LINES / TUBES:      Continuous Infusions: . feeding supplement (JEVITY 1.2 CAL) 1,000 mL (03/17/15 1600)    Objective: VITAL SIGNS: Temp: 99.1 F (37.3 C) (06/30 1623) Temp Source: Oral (06/30 1623) BP: 114/78 mmHg (06/30 1150) Pulse Rate: 80 (06/30 1514) SPO2; FIO2:   Intake/Output Summary (Last 24 hours) at 03/17/15 1648 Last data filed at 03/17/15 0915  Gross per 24 hour  Intake 531.83 ml  Output    550 ml  Net -18.17 ml     Exam: General: A/O  1, (does not know where, when, why), NAD No acute respiratory distress Eyes: Negative headache, eye pain, double vision,negative retinal hemorrhage ENT: Negative Runny nose, negative ear pain, negative tinnitus, negative gingival bleeding Neck:  Negative scars, masses, torticollis, lymphadenopathy, JVD, trachea in place with copious thick blood-tinged mucus being coughed up my patient. Lungs: diffuse coarse breath sounds Clear to auscultation bilaterally without wheezes or crackles Cardiovascular: Regular rate and rhythm without murmur gallop or rub normal S1 and S2 Abdomen:negative abdominal pain, negative dysphagia, Nontender, nondistended, soft, bowel sounds positive, no rebound, no ascites, no appreciable mass Extremities: No significant cyanosis, clubbing, or edema bilateral lower extremities Psychiatric:  Negative depression, negative anxiety, negative fatigue, negative mania  Neurologic:  Cranial nerves II through XII intact, tongue/uvula midline, all extremities muscle strength 5/5, sensation intact throughout,  negative dysarthria, negative expressive aphasia, negative receptive aphasia.     Data Reviewed: Basic Metabolic Panel:  Recent Labs Lab 03/11/15 0600 03/12/15 0230 03/13/15 0235 03/14/15 0220  03/15/15 0313 03/16/15 0225 03/17/15 0248  NA 146* 149* 150* 148* 142 139 136  K 3.9 4.4 3.8 4.1 4.0 4.0 4.3  CL 108 114* 115* 115* 110 106 105  CO2 21* 22  GLUCOSE 118* 102* 107* 100* 84 88 107*  BUN 80* 84* 77* 71* 64* 56* 51*  CREATININE 4.34* 4.30* 4.27* 4.16* 4.05* 4.03* 3.90*  CALCIUM 8.8* 9.0 9.1 9.0 8.8* 8.7* 8.6*  MG 1.9 2.0  --   --  2.2 2.1  --   PHOS 3.6 4.4  --   --  4.5  --   --    Liver Function Tests:  Recent Labs Lab 03/13/15 0235 03/15/15 0313 03/16/15 0225 03/17/15 0248  AST ALT ALKPHOS 76 70 65 66  BILITOT 0.7 0.9 0.8 0.6  PROT 7.0 7.7 7.3 7.2  ALBUMIN 2.3* 2.4* 2.4* 2.4*   No results for input(s): LIPASE, AMYLASE in the last 168 hours. No results for input(s): AMMONIA in the last 168 hours. CBC:  Recent Labs Lab 03/12/15 0230 03/13/15 0235 03/14/15 0220 03/16/15 0225 03/17/15 0248  WBC 13.8* 11.2* 9.5 9.7 14.1*  NEUTROABS  --   --   --  8.1*  --   HGB 9.4* 9.4* 10.1* 9.3* 8.7*  HCT 29.2* 30.1* 31.0* 29.5* 27.3*  MCV 90.7 90.7 91.2 89.7 88.3  PLT 310 330 310 318 319   Cardiac Enzymes: No results for input(s): CKTOTAL, CKMB, CKMBINDEX, TROPONINI in the last 168 hours. BNP (last 3 results) No results for input(s): BNP in the last 8760 hours.  ProBNP (last 3 results) No results for input(s): PROBNP in the last 8760 hours.  CBG:  Recent Labs Lab 03/16/15 2114 03/17/15 0024 03/17/15 0354 03/17/15 0744 03/17/15 1153  GLUCAP 107* 108* 109* 122* 147*    Recent Results (from the past 240 hour(s))  Culture, respiratory (NON-Expectorated)     Status: None (Preliminary result)   Collection Time: 03/16/15 11:15 AM  Result Value Ref Range Status   Specimen Description TRACHEAL ASPIRATE  Final   Special Requests Normal  Final   Gram Stain   Final    FEW WBC PRESENT, PREDOMINANTLY MONONUCLEAR RARE SQUAMOUS EPITHELIAL CELLS PRESENT FEW GRAM NEGATIVE RODS Performed at [facility]     Culture   Final    Culture reincubated for better growth Performed at [facility]    Report Status PENDING  Incomplete     Studies:  Recent x-ray studies have been reviewed in detail by the Attending Physician  Scheduled Meds:  Scheduled Meds: . amLODipine  10 mg Per Tube Daily  . free water  200 mL Per Tube 3 times per day  . labetalol  200 mg Per Tube BID    Time spent on care of this patient: 40 mins   WOODS, Roselind Messier , MD  [facility] Office  [phone] Pager - [phone]  On-Call/Text Page:      [email or web address]      password [email or web address]  If 7PM-7AM, please contact night-coverage [email or web address] Password [email or web address] 03/17/2015, 4:48 PM   LOS: 19 days   Care during the described time interval was provided by me .  I have reviewed this patient's available data, including medical history, events of note, physical examination, and all test results as part of my evaluation. I have personally reviewed and interpreted all radiology studies.   Carolyne Littles, MD [phone] Pager

## 2015-03-17 NOTE — Progress Notes (Addendum)
Speech Language Pathology Treatment: Dysphagia;Passy Muir Speaking valve  Patient Details Name: Lee Swanson MRN: [id number] DOB: October 16, 1950 Today's Date: 03/17/2015 Time: 1000-1030 SLP Time Calculation (min) (ACUTE ONLY): 30 min  Assessment / Plan / Recommendation Clinical Impression  Pt demonstrates much improved tolerance of PMSV after trach change to 6 cuffed trach. There are still copious thick secretions impacting safety with PMSV, though pt was able to wear it for 20 minutes without overt air trapping. Though vital signs remained stable, pts work of breathing increased after repeated trials, though this may be secondary to thick secretions being mobilized. SLP also provided trials of puree, which was the most successful texture on pts MBS two days ago, and could be reliably tolerated if pt has the capacity to cough with PMSV in place. He followed commands for deep breathing for increased volume with counting as well as second swallow and occasional throat clear with PO. Recommend observing pt again with PMSV and PO intake prior to allowing more liberal use of valve or giving more PO. Want to ensure pt can tolerate valve and trials with more confidence. Ideally, pts secretions will need to improve before he could wear valve for longer periods of time or consume meals. Continue NPO and PMSV with SLP only at this time.    HPI Other Pertinent Information: 64 yr old with hx of substance abuse, medical non compliance, HTN. CRD, suicidal Ideation admitted after being found down for unknown time, extremely hypertensive 250/155 and intubated. Recieved trach 6/22, trach collar 6/23. Per MD note pt cocaine + on admit. 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 Repeat MRI progressive infarcts with new infarcts  posterior right frontal, left parietal regin, right thalamus and bilateral cerebellum.  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 Acute left perimesencephalic subarachnoid hemorrhage.   Pertinent Vitals    SLP Plan  Continue with current plan of care    Recommendations Diet recommendations: NPO      Patient may use Passy-Muir Speech Valve: with SLP only PMSV Supervision: Full       General recommendations: Rehab consult Oral Care Recommendations: Oral care QID Follow up Recommendations: Inpatient Rehab Plan: Continue with current plan of care    GO    J[facility] Medical Center IncBonnie [name], MA CCC-SLP [phone]  [name], [name] Caroline 03/17/2015, 10:48 AM

## 2015-03-17 NOTE — Progress Notes (Signed)
Discussed in LOS meeting. 

## 2015-03-17 NOTE — Progress Notes (Signed)
Pt in bed, oxygenating at 100% via trach collar. Pt in restraints and is calmly tugging at them. Panda tube to left nare with Jevity 1.2 at 50 ml/hr. No signs of distress noted.

## 2015-03-17 NOTE — Progress Notes (Signed)
Nutrition Follow-up  DOCUMENTATION CODES:  Underweight, Severe malnutrition in context of chronic illness  INTERVENTION:  Continue Jevity 1.2 via NGT at goal rate of 65 ml/h providing 1872 kcals, 87 gm protein, 1264 ml free water daily.  Continue free water flushes of 200 ml TID.   RD to continue to monitor.   NUTRITION DIAGNOSIS:  Malnutrition related to chronic illness, inability to eat as evidenced by estimated needs, NPO status, severe depletion of muscle mass, severe depletion of body fat; ongoing  GOAL:  Patient will meet greater than or equal to 90% of their needs; met  MONITOR:  TF tolerance, Diet advancement, Weight trends, I & O's, Labs  REASON FOR ASSESSMENT:  Consult Enteral/tube feeding initiation and management  ASSESSMENT: Patient admitted on 6/11 with AMS, found unresponsive, HTN crisis, and SAH. MRI head showed brain stem edema, micro bleeds. S/P trach on 6/22. Currently on trach collar.  Pt has failed swallow evaluation. Small bore feeding tube placed 6/29. During time of visit, tube feeds have reached goal rate and he has been tolerating them well. RD to continue to monitor.   Labs: Low calcium and GFR. High BUN and creatinine.   Height:  Ht Readings from Last 1 Encounters:  03/16/15 5' 6"  (1.676 m)    Weight:  Wt Readings from Last 1 Encounters:  03/16/15 114 lb 10.2 oz (52 kg)    Ideal Body Weight:  70 kg  Wt Readings from Last 10 Encounters:  03/16/15 114 lb 10.2 oz (52 kg)  02/08/15 115 lb (52.164 kg)  01/29/15 115 lb 11.2 oz (52.481 kg)    BMI:  Body mass index is 18.51 kg/(m^2).  Estimated Nutritional Needs:  Kcal:  1700-1900  Protein:  75-95 gm  Fluid:  1.7-1.9 L  Skin:   (+1 generalized edema)  Diet Order:  Diet NPO time specified Except for: Other (See Comments)  EDUCATION NEEDS:  No education needs identified at this time   Intake/Output Summary (Last 24 hours) at 03/17/15 1344 Last data filed at 03/17/15  0915  Gross per 24 hour  Intake 531.83 ml  Output    550 ml  Net -18.17 ml    Last BM:  6/25  Corrin Parker, MS, RD, LDN Pager # [phone] After hours/ weekend pager # [phone]

## 2015-03-17 NOTE — Trach Care Team (Signed)
Trach Care Progression Note   Patient Details Name: Lee Swanson MRN: [id number] DOB: 1951-02-27 Today's Date: 03/17/2015   Tracheostomy Assessment    Tracheostomy Shiley 6 mm Cuffed (Active)  Status Secured 03/17/2015 12:10 PM  Site Assessment Clean;Dry 03/17/2015 12:10 PM  Site Care Cleansed 03/17/2015  7:45 AM  Inner Cannula Care Changed/new 03/16/2015  4:37 PM  Ties Assessment Secure 03/17/2015 12:10 PM  Cuff pressure (cm) 0 cm 03/17/2015 12:10 PM  Trach Changed Yes 03/16/2015  4:37 PM  Emergency Equipment at bedside Yes 03/17/2015 12:10 PM     Care Needs     Respiratory Therapy O2 Device: Tracheostomy Collar FiO2 (%): 28 % SpO2: 100 % Education: Not applicable Follow up recommendations: follow with progress Respiratory barriers to progression: Trach size    Speech Language Pathology  SLP chart review complete: Patient not ready for SLP services Patient may use Passy-Muir Speech Valve: with SLP only PMSV Supervision: Full MD: Please consider changing trach tube to: Cuffless Follow up Recommendations: Inpatient Rehab SLP barriers to progression: Secretions   Physical Therapy PT Recommendation/Assessment: Patient needs continued PT services Follow Up Recommendations: LTACH (versus Vent capable SNF) PT equipment: Wheelchair (measurements PT), Wheelchair cushion (measurements PT), Rolling walker with 5" wheels    Occupational Therapy      Nutritional Patient's Current Diet: NPO Tube Feeding: Vital AF 1.2 Cal Tube Feeding Frequency: Continuous Tube Feeding Strength: Full strength SLP Diet Recommendations: NPO    Case Management/Social Work      Theatre managerrovider Trach Care Team/Provider Recommendations Trach Care Team Members Present-  Lee PennerMary Swanson, RT, Lee DittyBonnie Swanson, SLP    Downsized to #6 cuffed trach. Still having increased secretions. Continue to follow.          Lee Swanson, Lee BandyDebra Swanson (Scribe for team) 03/17/2015, 1:55 PM

## 2015-03-18 LAB — GLUCOSE, CAPILLARY
GLUCOSE-CAPILLARY: 114 mg/dL — AB (ref 65–99)
GLUCOSE-CAPILLARY: 95 mg/dL (ref 65–99)
GLUCOSE-CAPILLARY: 107 mg/dL — AB (ref 65–99)
Glucose-Capillary: 106 mg/dL — ABNORMAL HIGH (ref 65–99)
Glucose-Capillary: 112 mg/dL — ABNORMAL HIGH (ref 65–99)
Glucose-Capillary: 135 mg/dL — ABNORMAL HIGH (ref 65–99)

## 2015-03-18 NOTE — Progress Notes (Signed)
Speech Language Pathology Treatment: Dysphagia;Passy Muir Speaking valve  Patient Details Name: Lee Swanson MRN: [id number] DOB: 09/30/1950 Today's Date: 03/18/2015 Time: [id number] SLP Time Calculation (min) (ACUTE ONLY): 15 min  Assessment / Plan / Recommendation Clinical Impression  Pt sitting in recliner with sitter present; PMV used for 15 minutes, with stable VS, SP02 > 90%, but significant coughing and production of copious thick secretions elicited with its use. Trials of puree provided - required mod verbal cues to swallow with heightened effort - weak cough elicited after 50% of trials.  Voice with low volume, breathy quality - pt required mod cues to improve respiratory support for better intelligibility.  Pt's secretions will need to improve before he can wear valve for longer periods of time or consume meals. Continue NPO and PMSV with SLP only at this time. MD notes trach may be changed to cuffless 7/3 or 7/4, which may enhance voice/swallow.    HPI Other Pertinent Information: 64 yr old with hx of substance abuse, medical non compliance, HTN. CRD, suicidal Ideation admitted after being found down for unknown time, extremely hypertensive 250/155 and intubated. Recieved trach 6/22, trach collar 6/23. Per MD note pt cocaine + on admit. 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 Repeat MRI progressive infarcts with new infarcts posterior right frontal, left parietal regin, right thalamus and bilateral cerebellum.  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 Acute left perimesencephalic subarachnoid hemorrhage.   Pertinent Vitals Pain Assessment: No/denies pain  SLP Plan  Continue with current plan of care    Recommendations Diet  recommendations: NPO      Patient may use Passy-Muir Speech Valve: with SLP only PMSV Supervision: Full       Oral Care Recommendations: Oral care QID Plan: Continue with current plan of care   Braelin Brosch L. Samson Fredericouture, [place] CCC/SLP Pager [phone]      Blenda MountsCouture, Ernie Sagrero Laurice 03/18/2015, 3:32 PM

## 2015-03-18 NOTE — Progress Notes (Signed)
Physical Therapy Treatment Patient Details Name: Lee Swanson R Demers MRN: [id number] DOB: 1951/09/03 Today's Date: 03/18/2015    History of Present Illness Pt is a 64 y/o male with a PMH of med noncompliance, HTN, tobacco/cocaine/ETOH, CKD, hep C, Barrett's esophagus. [facility] after he was found down for an unknown period of time. BP was noted to be 250/155. An MRI was consistent with posterior reversible encephalopathy syndrome with profound edema throughout the brain stem and an acute left subarachnoid hemorrhage. A CT scan of the head showed a small left subdural hematoma. Neurosurgery was consulted although surgery was not felt to be indicated. Neurology has been following until MRI showed increasing strokes. He was transferred to the stroke team 03/08/2015 for continued followup.     PT Comments    Pt continues to be very agreeable to mobility and PT.  Pt participates in mobility, though hasdecreased awareness of weakness in L side.  Feel pt may benefit from SNF vs Ltach at D/C pending on pt's continued medical needs.  Will continue to follow.    Follow Up Recommendations  LTACH (vs SNF)     Equipment Recommendations  Wheelchair (measurements PT);Wheelchair cushion (measurements PT);Rolling walker with 5" wheels    Recommendations for Other Services       Precautions / Restrictions Precautions Precautions: Fall Restrictions Weight Bearing Restrictions: No    Mobility  Bed Mobility Overal bed mobility: Needs Assistance Bed Mobility: Supine to Sit     Supine to sit: Mod assist;HOB elevated     General bed mobility comments: pt able to initiate mobility and bringing R side towards EOB.  pt needs A for L LE and bringing trunk up to sitting.    Transfers Overall transfer level: Needs assistance Equipment used: 2 person hand held assist Transfers: Sit to/from UGI CorporationStand;Stand Pivot Transfers Sit to Stand: Mod assist;+2 physical assistance Stand pivot transfers:  Mod assist;+2 physical assistance       General transfer comment: pt needs facilitation for positioning of LEs as he tends to scissor.  L knee blocked during transfers.  pt able to pivot to 3-in-1 and then to recliner.    Ambulation/Gait                 Stairs            Wheelchair Mobility    Modified Rankin (Stroke Patients Only) Modified Rankin (Stroke Patients Only) Pre-Morbid Rankin Score: No significant disability (Per chart review, but not clear PLOF.) Modified Rankin: Severe disability     Balance Overall balance assessment: Needs assistance Sitting-balance support: Single extremity supported;Feet supported Sitting balance-Leahy Scale: Poor Sitting balance - Comments: pt tends to lean to R side and posteriorly.   Postural control: Posterior lean;Right lateral lean Standing balance support: Bilateral upper extremity supported;During functional activity Standing balance-Leahy Scale: Poor Standing balance comment: pt able to stand for peri hygiene with 1 person ModA and second person present to perform peri hygiene.                      Cognition Arousal/Alertness: Awake/alert Behavior During Therapy: Flat affect Overall Cognitive Status: Difficult to assess                      Exercises      General Comments        Pertinent Vitals/Pain Pain Assessment: No/denies pain    Home Living  Prior Function            PT Goals (current goals can now be found in the care plan section) Acute Rehab PT Goals Patient Stated Goal: none stated PT Goal Formulation: Patient unable to participate in goal setting Time For Goal Achievement: 03/24/15 Potential to Achieve Goals: Good Progress towards PT goals: Progressing toward goals    Frequency  Min 2X/week    PT Plan Frequency needs to be updated    Co-evaluation             End of Session Equipment Utilized During Treatment: Gait  belt;Oxygen Activity Tolerance: Patient tolerated treatment well Patient left: in chair;with call bell/phone within reach;with nursing/sitter in room     Time: 3086-5784 PT Time Calculation (min) (ACUTE ONLY): 38 min  Charges:  $Therapeutic Activity: 23-37 mins                    G CodesSunny Schlein, Avery [phone] 03/18/2015, 3:02 PM

## 2015-03-18 NOTE — Progress Notes (Signed)
PULMONARY / CRITICAL CARE MEDICINE   Name: Lee Swanson MRN: [id number] DOB: November 29, 1950    ADMISSION DATE:  02/26/2015    INITIAL PRESENTATION:  56  Male with hx of substance abuse, medical non compliance, HTN. CRD, suicidal  Ideation who was found down for unknown time, breathing, extremely hypertensive 250/155 and was transported to [facility] ED. CT scan with small SDH, started on nipride, intubated. NS consulted and MRI ordered. He was cocaine + on admit. PCCM admitted.  MAJOR EVENTS/TEST RESULTS: 6/11 admitted as above. 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 The differential diagnosis does include toxic demyelination, but that is felt less likely 6/12 renal US: Echogenic renal parenchyma bilaterally, compatible with medical renal disease. No hydronephrosis 6/12 Neurology consultation: PRES vs toxic leukoencephalopathy  6/14 More responsive. Fever, copious purulent secretions. Resp culture and empiric abx initiated. Precedex initiated 6/15 Tolerates PS 5-10 cm H2O. Poor mentation and copious ET secretions prohibited extubation 6/16 Failed extubation almost immediately due to poor handling of oropharyngeal secretions 6/17 Tolerates PS 5-10 cm H2O. + F/C 6/19 MRI brain repeat>>>Improved pres, Progressive infarcts with new infarcts noted involving portions ofthe posterior right frontal lobe, left periatrial region, right thalamus and right cerebellum with persistent acute/ subacute infarcts involving the left periatrial region, left thalamus and left cerebellum. Tiny amount hemorrhage associated with the left cerebellar infarct not entirely excluded. 6/22 trach, secretions  remain an issue 6/23 off vent , ready for tx to sdu and to [facility] with PCCM as a consult 6/25- delirium, pulled out ngt and attempted to pull out trach  INDWELLING DEVICES: L femoral A-line 6/11 >> 6/14 ETT 6/11 >> 6/16>>>6/16>>>6/22 Trach 6/22(DF) >>> R [place] CVL 6/11 >>   MICRO DATA: MRSA PCR 6/11 >> NEG Resp 6/14 >> Klebsiella and enterobacter Sputum 6/29>>>  ANTIMICROBIALS:  Vanc 6/14 >> 6/17 Ceftriaxone 6/14 >>off  SUBJECTIVE:  Awake, mild aggitation  VITAL SIGNS: Temp:  [97.4 F (36.3 C)-99.1 F (37.3 C)] 98.6 F (37 C) (07/01 0706) Pulse Rate:  [76-85] 81 (07/01 0706) Resp:  [12-20] 17 (07/01 0846) BP: (114-150)/(78-93) 148/93 mmHg (07/01 0846) SpO2:  [100 %] 100 % (07/01 0706) FiO2 (%):  [28 %] 28 % (07/01 0846) Weight:  [45.5 kg (100 lb 5 oz)-47.4 kg (104 lb 8 oz)] 45.5 kg (100 lb 5 oz) (07/01 0452) HEMODYNAMICS:   VENTILATOR SETTINGS: Vent Mode:  [-]  FiO2 (%):  [28 %] 28 % INTAKE / OUTPUT:  Intake/Output Summary (Last 24 hours) at 03/18/15 1050 Last data filed at 03/18/15 0620  Gross per 24 hour  Intake      0 ml  Output    975 ml  Net   -975 ml    PHYSICAL EXAMINATION: General: Cachectic, RASS 0, follows commands. Neuro:RASS 0, weak on left improved, no distress  HEENT: temporal wasting, trach with minimal blood around trach entry Cardiovascular: s1 s2 RRR Lungs:scattered rhonchi, streaky hemoptysis  Abdomen: Soft, nondistended, +BS Ext: no edema  LABS:   CBC  Recent Labs Lab 03/14/15 0220 03/16/15 0225 03/17/15 0248  WBC 9.5 9.7 14.1*  HGB 10.1* 9.3* 8.7*  HCT 31.0* 29.5* 27.3*  PLT 310 318 319   Coag's  Recent Labs Lab 03/13/15 1635 03/16/15 0225  APTT 35  --  INR 1.09 1.19   BMET  Recent Labs Lab 03/15/15 0313 03/16/15 0225 03/17/15 0248  NA 142 139 136  K 4.0 4.0 4.3  CL 110 106 105  CO2 23 21* 22  BUN 64* 56* 51*  CREATININE 4.05* 4.03* 3.90*  GLUCOSE 84 88 107*   Electrolytes  Recent Labs Lab  03/12/15 0230  03/15/15 0313 03/16/15 0225 03/17/15 0248  CALCIUM 9.0  < > 8.8* 8.7* 8.6*  MG 2.0  --  2.2 2.1  --   PHOS 4.4  --  4.5  --   --   < > = values in this interval not displayed. Sepsis Markers No results for input(s): LATICACIDVEN, PROCALCITON, O2SATVEN in the last 168 hours. ABG No results for input(s): PHART, PCO2ART, PO2ART in the last 168 hours. Liver Enzymes  Recent Labs Lab 03/15/15 0313 03/16/15 0225 03/17/15 0248  AST 28 25 27   ALT 21 20 21   ALKPHOS 70 65 66  BILITOT 0.9 0.8 0.6  ALBUMIN 2.4* 2.4* 2.4*   Cardiac Enzymes No results for input(s): TROPONINI, PROBNP in the last 168 hours. 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 Ultimately would like to change to 6 cuffless but secretions at this point are the bigger barrier to this. Suspect that he may be able to phonate some and possibly do a little better w/ SLP efforts after trach size decreased.  Check sputum culture, pending. He will f/u with us in trach clinic after d/c, need to assure that his trach is switched to cuffless before he is moved out of the hospital but would not change til 7/3 or 7/4.  Alyson ReedyWesam G. Lashonna Rieke, M.D. [facility]. Pager: [phone]. After hours pager: [phone].

## 2015-03-18 NOTE — Progress Notes (Signed)
Patient transferred from 3 south. Patient alert, but mute. Patient came with sitter. Made comfortable. Will continue to monitor.

## 2015-03-18 NOTE — Progress Notes (Signed)
[facility] TEAM 1 - Stepdown/ICU TEAM Progress Note  Lee Swanson:[id number] DOB: 02/12/1951 DOA: 02/26/2015 PCP: No primary care provider on file.  Admit HPI / Brief Narrative: 89 M with hx of substance abuse, medical non compliance, HTN, CKD, and suicidalideation who was found down for unknown time, breathing, extremely hypertensive 250/155, and was transported to [facility] ED. CT noted small SDH, started on nipride, intubated. NS consulted and MRI ordered. He was cocaine + on admit. PCCM admitted to ICU.    Significant Events: 6/11 Intubated in ED 6/11 CT head: Changes suggestive of a small subdural hematoma along the left lateral midbrain 6/11 MRI brain: Profound edema throughout the brainstem, also with involvement of the cerebellum and posterior aspects of the cerebral hemispheres, quite likely to represent PRES with predominant brainstem involvement. The differential diagnosis includes toxic demyelination, but that is felt less likely 6/12 renal US: Echogenic renal parenchyma bilaterally, compatible with medical renal disease. No hydronephrosis 6/12 Neurology consultation: PRES vs toxic leukoencephalopathy  6/14 More responsive. Fever, copious purulent secretions. Resp culture and empiric abx initiated. Precedex initiated 6/15 Tolerates PS 5-10 cm H2O. Poor mentation and copious ET secretions prohibited extubation 6/16 Failed extubation almost immediately due to poor handling of oropharyngeal secretions 6/17 Tolerates PS 5-10 cm H2O. + F/C 6/19 MRI brain repeat > Improved PRES, progressive infarcts with new infarcts noted involving portions of the posterior right frontal lobe, left periatrial region, right thalamus and right cerebellum with persistent acute/ subacute infarcts involving the left periatrial region, left thalamus and left cerebellum. Tiny amount hemorrhage associated with the left cerebellar infarct not entirely excluded. 6/22 trach, secretions remain an issue 6/23 off  vent, ready for tx to sdu - PCCM to cont to follow as a consult for trach care  6/25 - [facility] assumed care   HPI/Subjective: The patient is able to communicate with me better today with PMV in place.  Though he answers some questions appropriately he is not able to tell me the year or to clearly explain to me his current circumstances.  He is fixated on being allowed to have water/ice chips.  He remains impulsive and appears to still be confused.  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  VDRF due to altered mental status - S/P tracheostomy Resolved but patient still has tracheostomy - trach care per PCCM - plan is to downsize trach and transition to cuffless trach no earlier than 7/3 or 7/4  Right basilar VAP versus aspiration pneumonia (Klebsiella and Enterobacter) Has completed a course of antibiotics therapy without any current symptoms to suggest infection  Nonoliguric acute kidney injury with chronic stage V kidney disease Baseline creatinine approximately 3.8 - creatinine appears to have stabilized at approximately 4.0  Hypernatremia Sodium has normalized with free water administration   Severe Protein calorie malnutrition in context of chronic illness NG tube placed 6/28 which patient promptly removed himself early morning 6/29 - small bore feeding tube placed 6/29 - wrist restraints/mittens to be used as needed to protect feeding tube - cont tube feeds - stable now for transfer to medical bed WITH sitter, or pt will definitely remove his NG again - cont TF for 72 hours, and reassess swallow -  if not improved at that time will need  PEG tube   Anemia of acute illness plus anemia of chronic kidney disease No evidence of significant overt blood loss, despite oozing of blood at trach - hemoglobin drifting down - follow trend  Mild hyperglycemia No prior history of diabetes - resolved  Severe physical debilitation Given the patient's probable continued encephalopathy and his severe physical deconditioning SNF is clearly most appropriate discharge disposition at the present time  Code Status: FULL Family Communication: no family present at time of exam - per PCCM notes no family able to be contacted since admission Disposition Plan: Stable for transfer to medical bed ONLY WITH BEDSIDE SITTER - continue temporary tube feeds - reassess swallow in 72 hours - possible need for PEG tube  Consultants: Neurology  NS PCCM  Antibiotics: None presently   DVT prophylaxis: SCDs  Objective: Blood pressure 102/68, pulse 69, temperature 98.9 F (37.2 C), temperature source Oral, resp. rate 22, height 5\' 6"  (1.676 m), weight 45.5 kg (100 lb 5 oz), SpO2 100 %.  Intake/Output Summary (Last 24 hours) at 03/18/15 1604 Last data filed at 03/18/15 0620  Gross per 24 hour  Intake      0 ml  Output    975 ml  Net   -975 ml   Exam: General: No acute respiratory distress - alert but confused  Lungs: Clear to auscultation bilaterally without wheezes or crackles Neck:  Trach in place - trach collar in place - trach w/o d/c or bleeding today  Cardiovascular: Regular rate and rhythm without murmur gallop rub Abdomen: Nontender, nondistended, soft, bowel sounds positive, no rebound, no ascites, no appreciable mass Extremities: No significant cyanosis, clubbing, edema bilateral lower extremities  Data Reviewed: Basic Metabolic Panel:  Recent Labs Lab 03/12/15 0230 03/13/15 0235 03/14/15 0220 03/15/15 0313 03/16/15 0225 03/17/15 0248  NA 149* 150* 148* 142 139 136  K 4.4 3.8 4.1 4.0 4.0 4.3  CL 114* 115* 115* 110 106 105    CO2 23 24 24 23  21* 22  GLUCOSE 102* 107* 100* 84 88 107*  BUN 84* 77* 71* 64* 56* 51*  CREATININE 4.30* 4.27* 4.16* 4.05* 4.03* 3.90*  CALCIUM 9.0 9.1 9.0 8.8* 8.7* 8.6*  MG 2.0  --   --  2.2 2.1  --   PHOS 4.4  --   --  4.5  --   --    CBC:  Recent Labs Lab 03/12/15 0230 03/13/15 0235 03/14/15 0220 03/16/15 0225 03/17/15 0248  WBC 13.8* 11.2* 9.5 9.7 14.1*  NEUTROABS  --   --   --  8.1*  --   HGB 9.4* 9.4* 10.1* 9.3* 8.7*  HCT 29.2* 30.1* 31.0* 29.5* 27.3*  MCV 90.7 90.7 91.2 89.7 88.3  PLT 310 330 310 318 319    Liver Function Tests:  Recent Labs Lab 03/13/15 0235 03/15/15 0313 03/16/15 0225 03/17/15 0248  AST 29 28 25 27   ALT 22 21 20 21   ALKPHOS 76 70 65 66  BILITOT 0.7 0.9 0.8 0.6  PROT 7.0 7.7 7.3 7.2  ALBUMIN 2.3* 2.4* 2.4* 2.4*   Coags:  Recent Labs Lab 03/13/15 1635 03/16/15 0225  INR 1.09 1.19    Recent Labs Lab 03/13/15 1635  APTT 35    CBG:  Recent Labs Lab 03/17/15 2326 03/18/15 0341 03/18/15 0735 03/18/15 1139 03/18/15 1501  GLUCAP 102* 114* 95 107* 135*    Studies:   Recent x-ray studies have been reviewed in detail by  the Attending Physician  Scheduled Meds:  Scheduled Meds: . amLODipine  10 mg Per Tube Daily  . free water  200 mL Per Tube 3 times per day  . labetalol  200 mg Per Tube BID    Time spent on care of this patient: 35 mins   Tykia Mellone T , MD   [facility] Office  [phone] Pager - Text Page per Loretha Stapler as per below:  On-Call/Text Page:      [email or web address]      password [email or web address]  If 7PM-7AM, please contact night-coverage [email or web address] Password [email or web address] 03/18/2015, 4:04 PM   LOS: 20 days

## 2015-03-18 NOTE — Clinical Social Work Placement (Signed)
   CLINICAL SOCIAL WORK PLACEMENT  NOTE  Date:  03/18/2015  Patient Details  Name: Lee Swanson MRN: [id number] Date of Birth: [birth year]  Clinical Social Work is seeking post-discharge placement for this patient at the Skilled  Nursing Facility level of care (*CSW will initial, date and re-position this form in  chart as items are completed):  Yes   Patient/family provided with [facility] Clinical Social Work Department's list of facilities offering this level of care within the geographic area requested by the patient (or if unable, by the patient's family).  Yes   Patient/family informed of their freedom to choose among providers that offer the needed level of care, that participate in Medicare, Medicaid or managed care program needed by the patient, have an available bed and are willing to accept the patient.  Yes   Patient/family informed of [facility]'s ownership interest in [facility] and [facility], as well as of the fact that they are under no obligation to receive care at these facilities.  PASRR submitted to EDS on       PASRR number received on       Existing PASRR number confirmed on       FL2 transmitted to all facilities in geographic area requested by pt/family on       FL2 transmitted to all facilities within larger geographic area on 03/18/15     Patient informed that his/her managed care company has contracts with or will negotiate with certain facilities, including the following:            Patient/family informed of bed offers received.  Patient chooses bed at       Physician recommends and patient chooses bed at      Patient to be transferred to   on  .  Patient to be transferred to facility by       Patient family notified on   of transfer.  Name of family member notified:        PHYSICIAN       Additional Comment:    _______________________________________________ Gwynne EdingerBibbs, Odessia Asleson, LCSW 03/18/2015, 2:14 PM

## 2015-03-18 NOTE — Progress Notes (Signed)
Patient to transfer to 4N29 report given to receiving nurse Juliette MangleFrancescia all questions answered at this time.  Pt. VSS with no s/s of distress noted.  Patient stable at transfer.

## 2015-03-19 LAB — GLUCOSE, CAPILLARY
GLUCOSE-CAPILLARY: 114 mg/dL — AB (ref 65–99)
GLUCOSE-CAPILLARY: 117 mg/dL — AB (ref 65–99)
Glucose-Capillary: 105 mg/dL — ABNORMAL HIGH (ref 65–99)
Glucose-Capillary: 116 mg/dL — ABNORMAL HIGH (ref 65–99)
Glucose-Capillary: 115 mg/dL — ABNORMAL HIGH (ref 65–99)

## 2015-03-19 LAB — CBC
HEMATOCRIT: 24.8 % — AB (ref 39.0–52.0)
HEMOGLOBIN: 7.7 g/dL — AB (ref 13.0–17.0)
MCH: 27.5 pg (ref 26.0–34.0)
MCHC: 31 g/dL (ref 30.0–36.0)
MCV: 88.6 fL (ref 78.0–100.0)
PLATELETS: 312 10*3/uL (ref 150–400)
RBC: 2.8 MIL/uL — AB (ref 4.22–5.81)
RDW: 14.3 % (ref 11.5–15.5)
WBC: 9 10*3/uL (ref 4.0–10.5)

## 2015-03-19 NOTE — Progress Notes (Signed)
Progress Note  Lee Swanson ZOX:[id number] DOB: April 10, 1951 DOA: 02/26/2015  PCP: No primary care provider on file.  Admit HPI / Brief Narrative: [age] M with hx of substance abuse, medical non compliance, HTN, CKD, and suicidalideation who was found down for unknown time, breathing, extremely hypertensive 250/155, and [facility] CT noted small SDH, started on nipride, intubated. NS consulted and MRI ordered. He was cocaine + on admit. PCCM admitted to ICU.    Significant Events: 6/11 Intubated in ED 6/11 CT head: Changes suggestive of a small subdural hematoma along the left lateral midbrain 6/11 MRI brain: Profound edema throughout the brainstem, also with involvement of the cerebellum and posterior aspects of the cerebral hemispheres, quite likely to represent PRES with predominant brainstem involvement. The differential diagnosis includes toxic demyelination, but that is felt less likely 6/12 renal US: Echogenic renal parenchyma bilaterally, compatible with medical renal disease. No hydronephrosis 6/12 Neurology consultation: PRES vs toxic leukoencephalopathy  6/14 More responsive. Fever, copious purulent secretions. Resp culture and empiric abx initiated. Precedex initiated 6/15 Tolerates PS 5-10 cm H2O. Poor mentation and copious ET secretions prohibited extubation 6/16 Failed extubation almost immediately due to poor handling of oropharyngeal secretions 6/17 Tolerates PS 5-10 cm H2O. + F/C 6/19 MRI brain repeat > Improved PRES, progressive infarcts with new infarcts noted involving portions of the posterior right frontal lobe, left periatrial region, right thalamus and right cerebellum with persistent acute/ subacute infarcts involving the left periatrial region, left thalamus and left cerebellum. Tiny amount hemorrhage associated with the left cerebellar infarct not entirely excluded. 6/22 trach, secretions remain an issue 6/23 off vent, ready for tx to sdu - PCCM to  cont to follow as a consult for trach care  6/25 - [facility] assumed care   Subjective: Patient requesting something to eat or drink by mouth. Denies any pain. Difficult to communicate due to his tracheostomy.   Assessment/Plan:  PRES - severe hypertensive encephalopathy Stable. Encephalopathy appears to be stubbornly unchanged with episodes of waxing and waning - follow serial exam - unfortunately this may represent the patient's new baseline mental status.  Malignant hypertension Blood pressure controlled at present time. 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  Right basilar VAP versus aspiration pneumonia (Klebsiella and Enterobacter) Has completed a course of antibiotics therapy without any current symptoms to suggest infection  Nonoliguric acute kidney injury with chronic stage V kidney disease Baseline creatinine approximately 3.8 - creatinine appears to have stabilized at approximately 4.0  Hypernatremia Sodium has normalized with free water administration   Severe Protein calorie malnutrition in context of chronic illness NG tube placed 6/28 which patient promptly removed himself early morning 6/29 - small bore feeding tube placed 6/29 - wrist restraints/mittens to be used as needed to protect feeding tube - cont tube feeds. Continue sitter or pt will definitely remove his NG again. Continue TF for 72 hours, and reassess swallow - if not improved at that time will need PEG tube.   Anemia of acute illness plus anemia of chronic kidney disease Hemoglobin noted to be trending down. There was apparently some oozing of blood from his tracheostomy site. No other overt losses noted. Continue to trend for now and transfuse if it drops  below 7.   Mild hyperglycemia No prior history of diabetes - resolved  Severe physical debilitation Given the patient's probable continued encephalopathy and his severe physical deconditioning SNF is clearly most appropriate discharge disposition at the present time.  DVT prophylaxis: SCDs Code Status: FULL Family Communication: no family present at bedside. Is unclear if any family is available. Disposition Plan: Will require placement eventually. But at this time he is not medically ready for discharge. Pulmonology plans to change into a cuffless tracheostomy tube tomorrow or the day after. His swallow function will also have to be reassessed. He may need a PEG tube.   Consultants: Neurology  NS PCCM  Antibiotics: None presently    Objective: Blood pressure 141/75, pulse 81, temperature 99.4 F (37.4 C), temperature source Oral, resp. rate 17, height 5\' 6"  (1.676 m), weight 42.6 kg (93 lb 14.7 oz), SpO2 99 %.  Intake/Output Summary (Last 24 hours) at 03/19/15 1032 Last data filed at 03/19/15 1610  Gross per 24 hour  Intake     90 ml  Output   1000 ml  Net   -910 ml   Exam: General:  alert but confused  Lungs: Diminished air entry at the bases without any crackles or wheezing.  Neck:  Trach in place. Yellowish drainage is noted. Cardiovascular: Regular rate and rhythm without murmur gallop rub Abdomen: Nontender, nondistended, soft, bowel sounds positive, no rebound, no ascites, no appreciable mass Extremities: No significant cyanosis, clubbing, edema bilateral lower extremities Moving all his extremities. Alert. Seems to be responding more appropriately this morning.  Data Reviewed: Basic Metabolic Panel:  Recent Labs Lab 03/13/15 0235 03/14/15 0220 03/15/15 0313 03/16/15 0225 03/17/15 0248  NA 150* 148* 142 139 136  K 3.8 4.1 4.0 4.0 4.3  CL 115* 115* 110 106 105  CO2 24 24 23  21* 22  GLUCOSE 107* 100* 84 88 107*  BUN 77* 71* 64* 56* 51*  CREATININE 4.27*  4.16* 4.05* 4.03* 3.90*  CALCIUM 9.1 9.0 8.8* 8.7* 8.6*  MG  --   --  2.2 2.1  --   PHOS  --   --  4.5  --   --    CBC:  Recent Labs Lab 03/13/15 0235 03/14/15 0220 03/16/15 0225 03/17/15 0248 03/19/15 0455  WBC 11.2* 9.5 9.7 14.1* 9.0  NEUTROABS  --   --  8.1*  --   --   HGB 9.4* 10.1* 9.3* 8.7* 7.7*  HCT 30.1* 31.0* 29.5* 27.3* 24.8*  MCV 90.7 91.2 89.7 88.3 88.6  PLT 330 310 318 319 312    Liver Function Tests:  Recent Labs Lab 03/13/15 0235 03/15/15 0313 03/16/15 0225 03/17/15 0248  AST 29 28 25 27   ALT 22 21 20 21   ALKPHOS 76 70 65 66  BILITOT 0.7 0.9 0.8 0.6  PROT 7.0 7.7 7.3 7.2  ALBUMIN 2.3* 2.4* 2.4* 2.4*   Coags:  Recent Labs Lab 03/13/15 1635 03/16/15 0225  INR 1.09 1.19    Recent Labs Lab 03/13/15 1635  APTT 35    CBG:  Recent Labs Lab 03/18/15 1501 03/18/15 1947 03/18/15 2335 03/19/15 0339 03/19/15 0739  GLUCAP 135* 106* 112* 114* 105*    Scheduled Meds:  Scheduled Meds: . amLODipine  10 mg Per Tube Daily  . free water  200 mL Per Tube 3 times per day  . labetalol  200 mg Per Tube BID    Time spent on care of this patient: 35 mins   Osvaldo Shipper , MD  [phone]  [facility] Office  [phone] Pager - Text Page per Loretha Stapler as per below:  On-Call/Text Page:      [email or web address]      password [email or web address]  If 7PM-7AM, please contact night-coverage [email or web address] Password [email or web address] 03/19/2015, 10:32 AM   LOS: 21 days

## 2015-03-20 ENCOUNTER — Inpatient Hospital Stay (HOSPITAL_COMMUNITY): Payer: Medicaid Other

## 2015-03-20 LAB — GLUCOSE, CAPILLARY
GLUCOSE-CAPILLARY: 116 mg/dL — AB (ref 65–99)
Glucose-Capillary: 108 mg/dL — ABNORMAL HIGH (ref 65–99)
Glucose-Capillary: 114 mg/dL — ABNORMAL HIGH (ref 65–99)
Glucose-Capillary: 117 mg/dL — ABNORMAL HIGH (ref 65–99)
Glucose-Capillary: 121 mg/dL — ABNORMAL HIGH (ref 65–99)
Glucose-Capillary: 82 mg/dL (ref 65–99)

## 2015-03-20 MED ORDER — WHITE PETROLATUM GEL
Status: AC
  Filled 2015-03-20: qty 1

## 2015-03-20 MED ORDER — SULFAMETHOXAZOLE-TRIMETHOPRIM 200-40 MG/5ML PO SUSP
10.0000 mL | Freq: Two times a day (BID) | ORAL | Status: DC
  Administered 2015-03-20 – 2015-03-24 (×9): 10 mL via ORAL
  Filled 2015-03-20 (×12): qty 10

## 2015-03-20 NOTE — Progress Notes (Signed)
MEDICATION RELATED NOTE   Pharmacy Re:  Sept[facility]  Labs:  Recent Labs  03/19/15 0455  WBC 9.0  HGB 7.7*  HCT 24.8*  PLT 312   Estimated Creatinine Clearance: 12.5 mL/min (by C-G formula based on Cr of 3.9).   Microbiology: Recent Results (from the past 720 hour(s))  MRSA PCR Screening     Status: None   Collection Time: 02/28/15  9:35 AM  Result Value Ref Range Status   MRSA by PCR NEGATIVE NEGATIVE Final    Comment:        The GeneXpert MRSA Assay (FDA approved for NASAL specimens only), is one component of a comprehensive MRSA colonization surveillance program. It is not intended to diagnose MRSA infection nor to guide or monitor treatment for MRSA infections.   Culture, respiratory (NON-Expectorated)     Status: None   Collection Time: 03/01/15  9:55 AM  Result Value Ref Range Status   Specimen Description TRACHEAL ASPIRATE  Final   Special Requests NONE  Final   Gram Stain   Final    ABUNDANT WBC PRESENT,BOTH PMN AND MONONUCLEAR RARE SQUAMOUS EPITHELIAL CELLS PRESENT MODERATE GRAM VARIABLE ROD FEW GRAM POSITIVE COCCI IN PAIRS FEW GRAM NEGATIVE COCCI    Culture   Final    MODERATE ENTEROBACTER CLOACAE MODERATE KLEBSIELLA PNEUMONIAE Performed at [facility]    Report Status 03/04/2015 FINAL  Final   Organism ID, Bacteria ENTEROBACTER CLOACAE  Final   Organism ID, Bacteria KLEBSIELLA PNEUMONIAE  Final      Susceptibility   Enterobacter cloacae - MIC*    CEFAZOLIN >=64 RESISTANT Resistant     CEFEPIME <=1 SENSITIVE Sensitive     CEFTAZIDIME <=1 SENSITIVE Sensitive     CEFTRIAXONE <=1 SENSITIVE Sensitive     CIPROFLOXACIN <=0.25 SENSITIVE Sensitive     GENTAMICIN <=1 SENSITIVE Sensitive     IMIPENEM <=0.25 SENSITIVE Sensitive     PIP/TAZO <=4 SENSITIVE Sensitive     TOBRAMYCIN <=1 SENSITIVE Sensitive     TRIMETH/SULFA <=20 SENSITIVE Sensitive     * MODERATE ENTEROBACTER CLOACAE   Klebsiella pneumoniae - MIC*    AMPICILLIN >=32 RESISTANT  Resistant     AMPICILLIN/SULBACTAM 8 SENSITIVE Sensitive     CEFAZOLIN <=4 SENSITIVE Sensitive     CEFEPIME <=1 SENSITIVE Sensitive     CEFTAZIDIME <=1 SENSITIVE Sensitive     CEFTRIAXONE <=1 SENSITIVE Sensitive     CIPROFLOXACIN <=0.25 SENSITIVE Sensitive     GENTAMICIN <=1 SENSITIVE Sensitive     IMIPENEM <=0.25 SENSITIVE Sensitive     PIP/TAZO <=4 SENSITIVE Sensitive     TOBRAMYCIN <=1 SENSITIVE Sensitive     TRIMETH/SULFA <=20 SENSITIVE Sensitive     * MODERATE KLEBSIELLA PNEUMONIAE  Culture, respiratory (NON-Expectorated)     Status: None (Preliminary result)   Collection Time: 03/16/15 11:15 AM  Result Value Ref Range Status   Specimen Description TRACHEAL ASPIRATE  Final   Special Requests Normal  Final   Gram Stain   Final    FEW WBC PRESENT, PREDOMINANTLY MONONUCLEAR RARE SQUAMOUS EPITHELIAL CELLS PRESENT FEW GRAM NEGATIVE RODS Performed at [facility]    Culture   Final    MODERATE GRAM NEGATIVE RODS Performed at [facility]    Report Status PENDING  Incomplete   Assessment: 64 yo male ordered Septra.  He has some renal impairment with Creat. of 3.9 with an est. crcl of 12.53ml/min.    Plan:  - Change Septra to 10ml q12 which will provide  400mg  sulfamethoxazole and 80 mg of trimethoprim   Nadara MustardNita Kennedi Lizardo, PharmD., MS Clinical Pharmacist Pager:  [phone] Thank you for allowing pharmacy to be part of this patients care team. 03/20/2015,3:35 PM

## 2015-03-20 NOTE — Progress Notes (Signed)
PULMONARY / CRITICAL CARE MEDICINE   Name: Lee Swanson MRN: [id number] DOB: 22-Dec-1950    ADMISSION DATE:  02/26/2015    INITIAL PRESENTATION:  [age]  Male with hx of substance abuse, medical non compliance, HTN. CRD, suicidal  Ideation who was found down for unknown time, breathing, extremely hypertensive 250/155 and was [facility] ED. CT scan with small SDH, started on nipride, intubated. NS consulted and MRI ordered. He was cocaine + on admit. PCCM admitted.  MAJOR EVENTS/TEST RESULTS: 6/11 admitted as above. 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 The differential diagnosis does include toxic demyelination, but that is felt less likely 6/12 renal US: Echogenic renal parenchyma bilaterally, compatible with medical renal disease. No hydronephrosis 6/12 Neurology consultation: PRES vs toxic leukoencephalopathy  6/14 More responsive. Fever, copious purulent secretions. Resp culture and empiric abx initiated. Precedex initiated 6/15 Tolerates PS 5-10 cm H2O. Poor mentation and copious ET secretions prohibited extubation 6/16 Failed extubation almost immediately due to poor handling of oropharyngeal secretions 6/17 Tolerates PS 5-10 cm H2O. + F/C 6/19 MRI brain repeat>>>Improved pres, Progressive infarcts with new infarcts noted involving portions ofthe posterior right frontal lobe, left periatrial region, right thalamus and right cerebellum with persistent acute/ subacute infarcts involving the left periatrial region, left thalamus and left cerebellum. Tiny amount hemorrhage associated with the left cerebellar infarct not entirely excluded. 6/22 trach, secretions  remain an issue 6/23 off vent , ready for tx to sdu and to [facility] with PCCM as a consult 6/25- delirium, pulled out ngt and attempted to pull out trach  INDWELLING DEVICES: L femoral A-line 6/11 >> 6/14 ETT 6/11 >> 6/16>>>6/16>>>6/22 Trach 6/22(DF) >>> R [place] CVL 6/11 > out   MICRO DATA: MRSA PCR 6/11 >> NEG Resp 6/14 >> Klebsiella and enterobacter sensitive to bactrim  Sputum 6/29> rare gnr on gm stain> moderate growth gnr   ANTIMICROBIALS:  Vanc 6/14 >> 6/17 Ceftriaxone 6/14 >>off Bactrim 7/3 >>>  SUBJECTIVE:  Awake, no increased wob/ secretions copious and purulent / spike to 101 7/2   VITAL SIGNS: Temp:  [98.2 F (36.8 C)-98.9 F (37.2 C)] 98.6 F (37 C) (07/03 0943) Pulse Rate:  [71-76] 76 (07/03 0950) Resp:  [16-18] 18 (07/03 0950) BP: (136-149)/(70-92) 149/81 mmHg (07/03 0943) SpO2:  [100 %] 100 % (07/03 1208) FiO2 (%):  [28 %] 28 % (07/03 1208) Weight:  [46 kg (101 lb 6.6 oz)] 46 kg (101 lb 6.6 oz) (07/03 0500) HEMODYNAMICS:   VENTILATOR SETTINGS: Vent Mode:  [-]  FiO2 (%):  [28 %] 28 % INTAKE / OUTPUT:  Intake/Output Summary (Last 24 hours) at 03/20/15 1520 Last data filed at 03/20/15 0539  Gross per 24 hour  Intake      0 ml  Output    850 ml  Net   -850 ml    PHYSICAL EXAMINATION: General: Cachectic,  follows commands/ mouths words approp  Neuro: weak on left improved, no distress  HEENT: temporal wasting, trach with copious mp secretions  Cardiovascular: s1 s2 RRR Lungs:scattered rhonchi, streaky hemoptysis  Abdomen: Soft, nondistended, +BS Ext: no edema  LABS:   CBC  Recent Labs Lab 03/16/15 0225 03/17/15 0248 03/19/15 0455  WBC 9.7 14.1* 9.0  HGB 9.3* 8.7* 7.7*  HCT 29.5* 27.3* 24.8*  PLT 318 319 312   Coag's  Recent Labs Lab 03/13/15 1635 03/16/15 0225  APTT 35  --   INR 1.09 1.19   BMET  Recent Labs Lab 03/15/15 0313 03/16/15 0225 03/17/15 0248  NA 142 139 136  K 4.0 4.0 4.3  CL 110 106 105  CO2 23 21* 22   BUN 64* 56* 51*  CREATININE 4.05* 4.03* 3.90*  GLUCOSE 84 88 107*   Electrolytes  Recent Labs Lab 03/15/15 0313 03/16/15 0225 03/17/15 0248  CALCIUM 8.8* 8.7* 8.6*  MG 2.2 2.1  --   PHOS 4.5  --   --    Sepsis Markers No results for input(s): LATICACIDVEN, PROCALCITON, O2SATVEN in the last 168 hours. ABG No results for input(s): PHART, PCO2ART, PO2ART in the last 168 hours. Liver Enzymes  Recent Labs Lab 03/15/15 0313 03/16/15 0225 03/17/15 0248  AST ALT ALKPHOS 70 65 66  BILITOT 0.9 0.8 0.6  ALBUMIN 2.4* 2.4* 2.4*   Cardiac Enzymes No results for input(s): TROPONINI, PROBNP in the last 168 hours. 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  Check sputum culture, pending. Start bactrim  7/3>>>   Sandrea Hughs, MD Pulmonary and Critical Care Medicine [facility] Cell [phone] After 5:30 PM or weekends, call [phone]

## 2015-03-20 NOTE — Progress Notes (Signed)
Progress Note  Lee Swanson NWG:[id number] DOB: 1951-03-01 DOA: 02/26/2015  PCP: No primary care provider on file.  Admit HPI / Brief Narrative: [age] M with hx of substance abuse, medical non compliance, HTN, CKD, and suicidalideation who was found down for unknown time, breathing, extremely hypertensive 250/155, and was transported to [facility] ED. CT noted small SDH, started on nipride, intubated. NS consulted and MRI ordered. He was cocaine + on admit. PCCM admitted to ICU.    Significant Events: 6/11 Intubated in ED 6/11 CT head: Changes suggestive of a small subdural hematoma along the left lateral midbrain 6/11 MRI brain: Profound edema throughout the brainstem, also with involvement of the cerebellum and posterior aspects of the cerebral hemispheres, quite likely to represent PRES with predominant brainstem involvement. The differential diagnosis includes toxic demyelination, but that is felt less likely 6/12 renal US: Echogenic renal parenchyma bilaterally, compatible with medical renal disease. No hydronephrosis 6/12 Neurology consultation: PRES vs toxic leukoencephalopathy  6/14 More responsive. Fever, copious purulent secretions. Resp culture and empiric abx initiated. Precedex initiated 6/15 Tolerates PS 5-10 cm H2O. Poor mentation and copious ET secretions prohibited extubation 6/16 Failed extubation almost immediately due to poor handling of oropharyngeal secretions 6/17 Tolerates PS 5-10 cm H2O. + F/C 6/19 MRI brain repeat > Improved PRES, progressive infarcts with new infarcts noted involving portions of the posterior right frontal lobe, left periatrial region, right thalamus and right cerebellum with persistent acute/ subacute infarcts involving the left periatrial region, left thalamus and left cerebellum. Tiny amount hemorrhage associated with the left cerebellar infarct not entirely excluded. 6/22 trach, secretions remain an issue 6/23 off vent, ready for tx to sdu - PCCM to  cont to follow as a consult for trach care  6/25 - [facility] assumed care   Subjective: Patient about the same. Denies any pain. Difficult to communicate due to his tracheostomy.   Assessment/Plan:  PRES - severe hypertensive encephalopathy Stable. He appears to be improving, though very slowly. However, he may never returned to his normal baseline.  Malignant hypertension Blood pressure controlled at present time. Continue current management.  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  Right basilar VAP versus aspiration pneumonia (Klebsiella and Enterobacter) Has completed a course of antibiotics therapy without any current symptoms to suggest infection  Nonoliguric acute kidney injury with chronic stage V kidney disease Baseline creatinine approximately 3.8 - creatinine appears to have stabilized   Hypernatremia Sodium has normalized with free water administration   Severe Protein calorie malnutrition in context of chronic illness NG tube placed 6/28 which patient promptly removed himself early morning 6/29 - small bore feeding tube placed 6/29 - wrist restraints/mittens to be used as needed to protect feeding tube - cont tube feeds. Continue sitter or pt will definitely remove his NG again. Continue TF for 72 hours, and reassess swallow - if not improved at that time will need PEG tube. Speech therapy is following.  Anemia of acute illness plus anemia of chronic kidney disease Hemoglobin noted to be trending down. There was apparently some oozing of blood from his tracheostomy site. No other overt losses noted. Continue to trend for now and transfuse if it drops below 7. Recheck hemoglobin tomorrow.  Mild hyperglycemia No prior history of diabetes -  resolved  Severe physical debilitation Given the patient's probable continued encephalopathy and his severe physical deconditioning SNF is clearly most appropriate discharge disposition at the present time.  DVT prophylaxis: SCDs Code Status: FULL Family Communication: no family present at bedside. Is unclear if any family is available. Disposition Plan: Will require placement eventually. But at this time he is not medically ready for discharge. Pulmonology plans to change into a cuffless tracheostomy tube today or tomorrow. His swallow function will also have to be reassessed. He may need a PEG tube if he fails repeated swallow evaluation.  Consultants: Neurology  NS PCCM  Antibiotics: None presently    Objective: Blood pressure 149/81, pulse 76, temperature 98.6 F (37 C), temperature source Oral, resp. rate 18, height 5\' 6"  (1.676 m), weight 46 kg (101 lb 6.6 oz), SpO2 100 %.  Intake/Output Summary (Last 24 hours) at 03/20/15 1000 Last data filed at 03/20/15 0539  Gross per 24 hour  Intake      0 ml  Output   1350 ml  Net  -1350 ml   Exam: General:  Alert. In no distress Lungs: Diminished air entry at the bases without any crackles or wheezing.  Neck:  Trach in place. Yellowish drainage is noted. Cardiovascular: Regular rate and rhythm without murmur gallop rub Abdomen: Nontender, nondistended, soft, bowel sounds positive, no rebound, no ascites, no appreciable mass Extremities: No significant cyanosis, clubbing, edema bilateral lower extremities Moving all his extremities. Alert.   Data Reviewed: Basic Metabolic Panel:  Recent Labs Lab 03/14/15 0220 03/15/15 0313 03/16/15 0225 03/17/15 0248  NA 148* 142 139 136  K 4.1 4.0 4.0 4.3  CL 115* 110 106 105  CO2 24 23 21* 22  GLUCOSE 100* 84 88 107*  BUN 71* 64* 56* 51*  CREATININE 4.16* 4.05* 4.03* 3.90*  CALCIUM 9.0 8.8* 8.7* 8.6*  MG  --  2.2 2.1  --   PHOS  --  4.5  --   --    CBC:  Recent Labs Lab  03/14/15 0220 03/16/15 0225 03/17/15 0248 03/19/15 0455  WBC 9.5 9.7 14.1* 9.0  NEUTROABS  --  8.1*  --   --   HGB 10.1* 9.3* 8.7* 7.7*  HCT 31.0* 29.5* 27.3* 24.8*  MCV 91.2 89.7 88.3 88.6  PLT 310 318 319 312    Liver Function Tests:  Recent Labs Lab 03/15/15 0313 03/16/15 0225 03/17/15 0248  AST 28 25 27   ALT 21 20 21   ALKPHOS 70 65 66  BILITOT 0.9 0.8 0.6  PROT 7.7 7.3 7.2  ALBUMIN 2.4* 2.4* 2.4*   Coags:  Recent Labs Lab 03/13/15 1635 03/16/15 0225  INR 1.09 1.19    Recent Labs Lab 03/13/15 1635  APTT 35    CBG:  Recent Labs Lab 03/19/15 1611 03/19/15 2003 03/20/15 0017 03/20/15 0402 03/20/15 0732  GLUCAP 116* 115* 114* 117* 108*    Scheduled Meds:  Scheduled Meds: . amLODipine  10 mg Per Tube Daily  . free water  200 mL Per Tube 3 times per day  . labetalol  200 mg Per Tube BID  . white petrolatum        Time spent on care of this patient: 35 mins   Osvaldo Shipper , MD  [phone]  [facility] Office  [phone] Pager - Text Page per Amion as per below:  On-Call/Text Page:      [email or web address]      password [email or web address]  If 7PM-7AM, please contact night-coverage [email or web address] Password [email or web address]  03/20/2015, 10:00 AM   LOS: 22 days

## 2015-03-20 NOTE — Progress Notes (Signed)
Patient's feeding tube is clogged.  RN removed  and replaced the panda tube.  KUB done to confirm the panda placement and result notified Alvaallahan PA. Telephone order to restart tube feeding.

## 2015-03-21 DIAGNOSIS — R1310 Dysphagia, unspecified: Secondary | ICD-10-CM

## 2015-03-21 LAB — BASIC METABOLIC PANEL
ANION GAP: 9 (ref 5–15)
BUN: 66 mg/dL — ABNORMAL HIGH (ref 6–20)
CALCIUM: 8.6 mg/dL — AB (ref 8.9–10.3)
CHLORIDE: 106 mmol/L (ref 101–111)
CO2: 24 mmol/L (ref 22–32)
CREATININE: 3.77 mg/dL — AB (ref 0.61–1.24)
GFR calc Af Amer: 18 mL/min — ABNORMAL LOW (ref >60)
GFR calc non Af Amer: 16 mL/min — ABNORMAL LOW (ref >60)
Glucose, Bld: 108 mg/dL — ABNORMAL HIGH (ref 65–99)
Potassium: 4.9 mmol/L (ref 3.5–5.1)
Sodium: 139 mmol/L (ref 135–145)

## 2015-03-21 LAB — GLUCOSE, CAPILLARY
Glucose-Capillary: 104 mg/dL — ABNORMAL HIGH (ref 65–99)
Glucose-Capillary: 110 mg/dL — ABNORMAL HIGH (ref 65–99)
Glucose-Capillary: 119 mg/dL — ABNORMAL HIGH (ref 65–99)
Glucose-Capillary: 126 mg/dL — ABNORMAL HIGH (ref 65–99)
Glucose-Capillary: 95 mg/dL (ref 65–99)
Glucose-Capillary: 97 mg/dL (ref 65–99)

## 2015-03-21 LAB — CULTURE, RESPIRATORY

## 2015-03-21 LAB — CBC
HCT: 24.5 % — ABNORMAL LOW (ref 39.0–52.0)
HEMOGLOBIN: 7.8 g/dL — AB (ref 13.0–17.0)
MCH: 28.3 pg (ref 26.0–34.0)
MCHC: 31.8 g/dL (ref 30.0–36.0)
MCV: 88.8 fL (ref 78.0–100.0)
PLATELETS: 329 10*3/uL (ref 150–400)
RBC: 2.76 MIL/uL — AB (ref 4.22–5.81)
RDW: 14.1 % (ref 11.5–15.5)
WBC: 12 10*3/uL — ABNORMAL HIGH (ref 4.0–10.5)

## 2015-03-21 LAB — CULTURE, RESPIRATORY W GRAM STAIN: Special Requests: NORMAL

## 2015-03-21 NOTE — Progress Notes (Signed)
Changed patient trach size from 6 shiley cuffed to 4 shiley uncuffed per MD order.  Placement confirmed by EZ cap, bilateral breath sounds, and pulse oximetry.  HR 71, RR 18, Sa02 100%.  Patient is tolerating well at this time.  RN and additional Respiratory therapist at bedside during procedure.

## 2015-03-21 NOTE — Progress Notes (Addendum)
Progress Note  Lee Swanson ZOX:[id number] DOB: 08/07/1951 DOA: 02/26/2015  PCP: No primary care provider on file.  Admit HPI / Brief Narrative: 38 M with hx of substance abuse, medical non compliance, HTN, CKD, and suicidalideation who was found down for unknown time, breathing, extremely hypertensive 250/155, and was transported to [facility] ED. CT noted small SDH, started on nipride, intubated. NS consulted and MRI ordered. He was cocaine + on admit. PCCM admitted to ICU.    Significant Events: 6/11 Intubated in ED 6/11 CT head: Changes suggestive of a small subdural hematoma along the left lateral midbrain 6/11 MRI brain: Profound edema throughout the brainstem, also with involvement of the cerebellum and posterior aspects of the cerebral hemispheres, quite likely to represent PRES with predominant brainstem involvement. The differential diagnosis includes toxic demyelination, but that is felt less likely 6/12 renal US: Echogenic renal parenchyma bilaterally, compatible with medical renal disease. No hydronephrosis 6/12 Neurology consultation: PRES vs toxic leukoencephalopathy  6/14 More responsive. Fever, copious purulent secretions. Resp culture and empiric abx initiated. Precedex initiated 6/15 Tolerates PS 5-10 cm H2O. Poor mentation and copious ET secretions prohibited extubation 6/16 Failed extubation almost immediately due to poor handling of oropharyngeal secretions 6/17 Tolerates PS 5-10 cm H2O. + F/C 6/19 MRI brain repeat > Improved PRES, progressive infarcts with new infarcts noted involving portions of the posterior right frontal lobe, left periatrial region, right thalamus and right cerebellum with persistent acute/ subacute infarcts involving the left periatrial region, left thalamus and left cerebellum. Tiny amount hemorrhage associated with the left cerebellar infarct not entirely excluded. 6/22 trach, secretions remain an issue 6/23 off vent, ready for tx to sdu - PCCM to  cont to follow as a consult for trach care  6/25 - [facility] assumed care  7/3 - changed to cuffless tracheostomy tube  Subjective: Difficult to communicate with patient due to his inability to talk. He tried writing down, but it was not legible.    Assessment/Plan:  PRES - severe hypertensive encephalopathy Stable. He appears to be improving, though very slowly. Appears to be waxing and waning at times. He was placed back on mittens for restraints last night. He may never returned to his normal baseline.  Essential hypertension Blood pressure was significantly high at the time of admission. Blood pressure controlled at present time. Continue labetalol.  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 Trach care per PCCM. He was changed to cuffless trach 7/4. Ordered him Bactrim yesterday by pulmonology.  Right basilar VAP versus aspiration pneumonia (Klebsiella and Enterobacter) Has completed a course of antibiotics therapy without any current symptoms to suggest infection  Nonoliguric acute kidney injury with chronic stage V kidney disease Baseline creatinine approximately 3.8 - creatinine appears to have stabilized   Hypernatremia Sodium has normalized with free water administration   Severe Protein calorie malnutrition in context of chronic illness NG tube placed 6/28 which patient promptly removed himself early morning 6/29 - small bore feeding tube placed 6/29 - wrist restraints/mittens to be used as needed to protect feeding tube - cont tube feeds. Continue sitter or pt will definitely remove his NG again. Continue TF. That he has been changed to a cuffless tracheostomy tube. Speech therapy can work with him some more. Hopefully he will be able to take adequate nutrition by mouth. If not, he will need a PEG tube, although we should try and avoid as  much as possible,  considering that he may pull it out.  Anemia of acute illness plus anemia of chronic kidney disease Hemoglobin did trend down, but stable now. There was apparently some oozing of blood from his tracheostomy site. No other overt losses noted. Continue to trend for now and transfuse if it drops below 7.   Mild hyperglycemia No prior history of diabetes - resolved  Severe physical debilitation PT OT is working with the patient.  DVT prophylaxis: SCDs Code Status: FULL Family Communication: no family present at bedside. Is unclear if any family is available. Disposition Plan: Not ready for discharge yet. Might require placement. His swallow function will have to be reassessed. He may need a PEG tube if he fails repeated swallow evaluation.  Consultants: Neurology  NS PCCM  Antibiotics: None presently    Objective: Blood pressure 144/80, pulse 78, temperature 98.8 F (37.1 C), temperature source Oral, resp. rate 16, height 5\' 6"  (1.676 m), weight 45.1 kg (99 lb 6.8 oz), SpO2 100 %.  Intake/Output Summary (Last 24 hours) at 03/21/15 0931 Last data filed at 03/21/15 0500  Gross per 24 hour  Intake      0 ml  Output   1150 ml  Net  -1150 ml   Exam: General:  Alert. In no distress Lungs: Diminished air entry at the bases without any crackles or wheezing.  Neck:  Trach in place. Yellowish drainage is noted. Cardiovascular: Regular rate and rhythm without murmur gallop rub Abdomen: Nontender, nondistended, soft, bowel sounds positive, no rebound, no ascites, no appreciable mass Extremities: No significant cyanosis, clubbing, edema bilateral lower extremities Moving all his extremities. Alert.   Data Reviewed: Basic Metabolic Panel:  Recent Labs Lab 03/15/15 0313 03/16/15 0225 03/17/15 0248 03/21/15 0425  NA 142 139 136 139  K 4.0 4.0 4.3 4.9  CL 110 106 105 106  CO2 23 21* 22 24  GLUCOSE 84 88 107* 108*  BUN 64* 56* 51* 66*  CREATININE 4.05* 4.03* 3.90* 3.77*    CALCIUM 8.8* 8.7* 8.6* 8.6*  MG 2.2 2.1  --   --   PHOS 4.5  --   --   --    CBC:  Recent Labs Lab 03/16/15 0225 03/17/15 0248 03/19/15 0455 03/21/15 0425  WBC 9.7 14.1* 9.0 12.0*  NEUTROABS 8.1*  --   --   --   HGB 9.3* 8.7* 7.7* 7.8*  HCT 29.5* 27.3* 24.8* 24.5*  MCV 89.7 88.3 88.6 88.8  PLT 318 319 312 329    Liver Function Tests:  Recent Labs Lab 03/15/15 0313 03/16/15 0225 03/17/15 0248  AST 28 25 27   ALT 21 20 21   ALKPHOS 70 65 66  BILITOT 0.9 0.8 0.6  PROT 7.7 7.3 7.2  ALBUMIN 2.4* 2.4* 2.4*   Coags:  Recent Labs Lab 03/16/15 0225  INR 1.19   No results for input(s): APTT in the last 168 hours.  CBG:  Recent Labs Lab 03/20/15 1614 03/20/15 2004 03/20/15 2347 03/21/15 0351 03/21/15 0816  GLUCAP 121* 82 97 119* 126*    Scheduled Meds:  Scheduled Meds: . amLODipine  10 mg Per Tube Daily  . free water  200 mL Per Tube 3 times per day  . labetalol  200 mg Per Tube BID  . sulfamethoxazole-trimethoprim  10 mL Oral Silvano [id number]      Sadie Hazelett , MD  [phone]  [facility] Office  [phone] Pager - Text Page per Amion as per below:  On-Call/Text Page:  [email or web address]      password [email or web address]  If 7PM-7AM, please contact night-coverage [email or web address] Password [email or web address] 03/21/2015, 9:31 AM   LOS: 23 days

## 2015-03-21 NOTE — Progress Notes (Signed)
PULMONARY / CRITICAL CARE MEDICINE   Name: Lee Swanson MRN: [id number] DOB: [birth year]    ADMISSION DATE:  02/26/2015    INITIAL PRESENTATION:  43  Male with hx of substance abuse, medical non compliance, HTN. CRD, suicidal  Ideation who was found down for unknown time, breathing, extremely hypertensive 250/155 and was transported to [facility] ED. CT scan with small SDH, started on nipride, intubated. NS consulted and MRI ordered. He was cocaine + on admit. PCCM admitted.  MAJOR EVENTS/TEST RESULTS: 6/11 admitted as above. 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 The differential diagnosis does include toxic demyelination, but that is felt less likely 6/12 renal US: Echogenic renal parenchyma bilaterally, compatible with medical renal disease. No hydronephrosis 6/12 Neurology consultation: PRES vs toxic leukoencephalopathy  6/14 More responsive. Fever, copious purulent secretions. Resp culture and empiric abx initiated. Precedex initiated 6/15 Tolerates PS 5-10 cm H2O. Poor mentation and copious ET secretions prohibited extubation 6/16 Failed extubation almost immediately due to poor handling of oropharyngeal secretions 6/17 Tolerates PS 5-10 cm H2O. + F/C 6/19 MRI brain repeat>>>Improved pres, Progressive infarcts with new infarcts noted involving portions ofthe posterior right frontal lobe, left periatrial region, right thalamus and right cerebellum with persistent acute/ subacute infarcts involving the left periatrial region, left thalamus and left cerebellum. Tiny amount hemorrhage associated with the left cerebellar infarct not entirely excluded. 6/22 trach, secretions  remain an issue 6/23 off vent , ready for tx to sdu and to [facility] with PCCM as a consult 6/25- delirium, pulled out ngt and attempted to pull out trach 7/4 rec change to #4 cuffless   INDWELLING DEVICES: L femoral A-line 6/11 >> 6/14 ETT 6/11 >> 6/16>>>6/16>>>6/22 Trach 6/22(DF) >>> R [place] CVL 6/11 > out   MICRO DATA: MRSA PCR 6/11 >> NEG Resp 6/14 >> Klebsiella and enterobacter sensitive to bactrim  Sputum 6/29> rare gnr on gm stain> moderate growth gnr >>>  ANTIMICROBIALS:  Vanc 6/14 >> 6/17 Ceftriaxone 6/14 >>off Bactrim 7/3 >>>  SUBJECTIVE/overnight  Awake, good spont cough effort/ no fever since 7/2   VITAL SIGNS: Temp:  [97.5 F (36.4 C)-98.8 F (37.1 C)] 98.8 F (37.1 C) (07/04 0807) Pulse Rate:  [68-78] 78 (07/04 0807) Resp:  [16-20] 16 (07/04 0807) BP: (132-158)/(71-89) 144/80 mmHg (07/04 0807) SpO2:  [99 %-100 %] 100 % (07/04 0807) FiO2 (%):  [28 %] 28 % (07/04 0547) Weight:  [99 lb 6.8 oz (45.1 kg)] 99 lb 6.8 oz (45.1 kg) (07/04 0500) HEMODYNAMICS:   VENTILATOR SETTINGS: Vent Mode:  [-]  FiO2 (%):  [28 %] 28 % INTAKE / OUTPUT:  Intake/Output Summary (Last 24 hours) at 03/21/15 1006 Last data filed at 03/21/15 0500  Gross per 24 hour  Intake      0 ml  Output   1150 ml  Net  -1150 ml    PHYSICAL EXAMINATION: General: Cachectic,  follows commands/ mouths words approp  Neuro: weak on left improved, no distress  HEENT: temporal wasting, trach with copious mp secretions  Cardiovascular: s1 s2 RRR Lungs:scattered rhonchi, streaky hemoptysis  Abdomen: Soft, nondistended, +BS Ext: no edema  LABS:   CBC  Recent Labs Lab 03/17/15 0248 03/19/15 0455 03/21/15 0425  WBC 14.1* 9.0 12.0*  HGB 8.7*  7.7* 7.8*  HCT 27.3* 24.8* 24.5*  PLT 319 312 329   Coag's  Recent Labs Lab 03/16/15 0225  INR 1.19   BMET  Recent Labs Lab 03/16/15 0225 03/17/15 0248 03/21/15 0425  NA 139 136 139  K 4.0 4.3 4.9  CL 106 105 106  CO2 21* 22 24  BUN  56* 51* 66*  CREATININE 4.03* 3.90* 3.77*  GLUCOSE 88 107* 108*   Electrolytes  Recent Labs Lab 03/15/15 0313 03/16/15 0225 03/17/15 0248 03/21/15 0425  CALCIUM 8.8* 8.7* 8.6* 8.6*  MG 2.2 2.1  --   --   PHOS 4.5  --   --   --    Sepsis Markers No results for input(s): LATICACIDVEN, PROCALCITON, O2SATVEN in the last 168 hours. ABG No results for input(s): PHART, PCO2ART, PO2ART in the last 168 hours. Liver Enzymes  Recent Labs Lab 03/15/15 0313 03/16/15 0225 03/17/15 0248  AST 28 25 27   ALT 21 20 21   ALKPHOS 70 65 66  BILITOT 0.9 0.8 0.6  ALBUMIN 2.4* 2.4* 2.4*   Cardiac Enzymes No results for input(s): TROPONINI, PROBNP in the last 168 hours. 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  Check sputum culture, pending. Started bactrim  7/3>>>    Sandrea HughsMichael Ulysses Alper, MD Pulmonary and Critical Care Medicine [facility] Cell [phone] After 5:30 PM or weekends, call [phone]

## 2015-03-21 NOTE — Progress Notes (Signed)
   03/21/15 1342  RT Progression Team  O2 Device Tracheostomy Collar  FiO2 (%) 28 %  SpO2 100 %  Tracheostomy Shiley 6 mm Cuffed  Placement Date/Time: 03/16/15 1110   Placed By: Self  Brand: Shiley  Size (mm): 6 mm  Style: Cuffed  Status Secured  Ties Assessment Secure  Cuff pressure (cm) 0 cm  Emergency Equipment at bedside Yes  Waiting for new trach to be delivered for change out.  Patient is resting at this time.

## 2015-03-21 NOTE — Clinical Social Work Note (Signed)
CSW reviewed chart. At this time the pt does not have a SNF bed. The barriers to placement are no insurance, trach, and feeding source. CSW will continue to follow and assist with discharge needs.   Anushri Casalino, MSW, LCSWA [phone]

## 2015-03-22 ENCOUNTER — Inpatient Hospital Stay (HOSPITAL_COMMUNITY): Payer: Medicaid Other

## 2015-03-22 LAB — GLUCOSE, CAPILLARY
GLUCOSE-CAPILLARY: 98 mg/dL (ref 65–99)
GLUCOSE-CAPILLARY: 98 mg/dL (ref 65–99)
Glucose-Capillary: 103 mg/dL — ABNORMAL HIGH (ref 65–99)
Glucose-Capillary: 157 mg/dL — ABNORMAL HIGH (ref 65–99)
Glucose-Capillary: 103 mg/dL — ABNORMAL HIGH (ref 65–99)

## 2015-03-22 MED ORDER — RESOURCE THICKENUP CLEAR PO POWD
ORAL | Status: DC | PRN
  Filled 2015-03-22: qty 125

## 2015-03-22 NOTE — Progress Notes (Signed)
Speech Language Pathology Treatment: Dysphagia;Passy Muir Speaking valve  Patient Details Name: Armandina Stammerreston R Dadamo MRN: [id number] DOB: 11-28-1950 Today's Date: 03/22/2015 Time: [id number] SLP Time Calculation (min) (ACUTE ONLY): 15 min  Assessment / Plan / Recommendation Clinical Impression  Pt's trach has been downsized to #4 cuffless, which has substantially improved his toleration of the PMV.  Valve placed for 15 minutes with oxygenation at 100% and no change in vitals.  Pt's voice and respiratory support much improved; speech intelligibility has increased to >90% with min cues for pacing/respiration.  Trial ice chips were provided to determine readiness for repeat MBS. Pt with persisting s/s of dysphagia, but recommend proceeding with MBS to determine potential of oral alimentation, even if modified diet. Pt eager to eat.  Orders received per Dr. Rito EhrlichKrishnan to proceed - he is scheduled for 1330 MBS today.  RN notified.   Please allow pt to use PMV during all waking hours - remove when sleeping.    HPI Other Pertinent Information: 64 yr old with hx of substance abuse, medical non compliance, HTN. CRD, suicidal Ideation admitted after being found down for unknown time, extremely hypertensive 250/155 and intubated. Recieved trach 6/22, trach collar 6/23. Per MD note pt cocaine + on admit. 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 Repeat MRI progressive infarcts with new infarcts posterior right frontal, left parietal regin, right thalamus and bilateral cerebellum.  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 Acute left perimesencephalic subarachnoid hemorrhage.   Pertinent Vitals Pain Assessment: No/denies pain  SLP Plan  Continue with current plan of care;MBS     Recommendations Diet recommendations: NPO      Patient may use Passy-Muir Speech Valve: During all waking hours (remove during sleep) PMSV Supervision: Intermittent       Oral Care Recommendations: Oral care QID Follow up Recommendations: Skilled Nursing facility Plan: Continue with current plan of care;MBS    GO     Blenda MountsCouture, Romelia Bromell Laurice 03/22/2015, 12:01 PM

## 2015-03-22 NOTE — Progress Notes (Signed)
Patient ID: Lee Swanson, male   DOB: 06-15-1951, 64 y.o.   MRN: [id number]    Request for percutaneous Gastric tube received 7/1  Awaiting decision form [facility] if need to move forward. Will chart check But please let us know   afeb Labs ok CT has been reviewed regarding anatomy--procedure approved by IR MD.

## 2015-03-22 NOTE — Progress Notes (Signed)
Nutrition Follow-up  DOCUMENTATION CODES:  Underweight, Severe malnutrition in context of chronic illness  INTERVENTION:  Magic cup, Snacks  NUTRITION DIAGNOSIS:  Malnutrition related to chronic illness, inability to eat as evidenced by estimated needs, NPO status, severe depletion of muscle mass, severe depletion of body fat.  Ongoing  GOAL:  Patient will meet greater than or equal to 90% of their needs  Unmet  MONITOR:  PO intake, Supplement acceptance, Labs, Weight trends, Skin, I & O's  REASON FOR ASSESSMENT:  Consult Assessment of nutrition requirement/status  ASSESSMENT: Patient admitted on 6/11 with AMS, found unresponsive, HTN crisis, and SAH. MRI head showed brain stem edema, micro bleeds. S/P trach on 6/22. Currently on trach collar.  Pt was assessed by SLP and advanced to a Dysphagia 1 diet with honey-thick liquids this afternoon. Pt eating his first meal at time of visit- consumed about 90%. Pt states that his appetite is good. RD encouraged PO intake. He is agreeable to receiving Magic Cup ice cream with meals; RD will continue to monitor PO adequacy.   Labs: low GFR, elevated BUN/Creatinine, low calcium, low hemoglobin  Height:  Ht Readings from Last 1 Encounters:  03/16/15 5\' 6"  (1.676 m)    Weight:  Wt Readings from Last 1 Encounters:  03/22/15 104 lb 11.5 oz (47.5 kg)    Ideal Body Weight:  70 kg  Wt Readings from Last 10 Encounters:  03/22/15 104 lb 11.5 oz (47.5 kg)  02/08/15 115 lb (52.164 kg)  01/29/15 115 lb 11.2 oz (52.481 kg)    BMI:  Body mass index is 16.91 kg/(m^2). Underweight  Estimated Nutritional Needs:  Kcal:  1700-1900  Protein:  65-80 grams  Fluid:  1.7-1.9 L/day  Skin:   (+1 generalized edema)  Diet Order:  DIET - DYS 1 Room service appropriate?: Yes; Fluid consistency:: Honey Thick  EDUCATION NEEDS:  No education needs identified at this time   Intake/Output Summary (Last 24 hours) at 03/22/15 1553 Last  data filed at 03/21/15 1757  Gross per 24 hour  Intake      0 ml  Output    275 ml  Net   -275 ml    Last BM:  7/5  Ian Malkineanne Barnett RD, LDN Inpatient Clinical Dietitian Pager: [phone] After Hours Pager: [phone]

## 2015-03-22 NOTE — Progress Notes (Signed)
Progress Note  Lee Swanson:[id number] DOB: 1951/07/20 DOA: 02/26/2015  PCP: No primary care provider on file.  Admit HPI / Brief Narrative: 76 M with hx of substance abuse, medical non compliance, HTN, CKD, and suicidalideation who was found down for unknown time, breathing, extremely hypertensive 250/155, and was transported to [facility] ED. CT noted small SDH, started on nipride, intubated. NS consulted and MRI ordered. He was cocaine + on admit. PCCM admitted to ICU.    Significant Events: 6/11 Intubated in ED 6/11 CT head: Changes suggestive of a small subdural hematoma along the left lateral midbrain 6/11 MRI brain: Profound edema throughout the brainstem, also with involvement of the cerebellum and posterior aspects of the cerebral hemispheres, quite likely to represent PRES with predominant brainstem involvement. The differential diagnosis includes toxic demyelination, but that is felt less likely 6/12 renal US: Echogenic renal parenchyma bilaterally, compatible with medical renal disease. No hydronephrosis 6/12 Neurology consultation: PRES vs toxic leukoencephalopathy  6/14 More responsive. Fever, copious purulent secretions. Resp culture and empiric abx initiated. Precedex initiated 6/15 Tolerates PS 5-10 cm H2O. Poor mentation and copious ET secretions prohibited extubation 6/16 Failed extubation almost immediately due to poor handling of oropharyngeal secretions 6/17 Tolerates PS 5-10 cm H2O. + F/C 6/19 MRI brain repeat > Improved PRES, progressive infarcts with new infarcts noted involving portions of the posterior right frontal lobe, left periatrial region, right thalamus and right cerebellum with persistent acute/ subacute infarcts involving the left periatrial region, left thalamus and left cerebellum. Tiny amount hemorrhage associated with the left cerebellar infarct not entirely excluded. 6/22 trach, secretions remain an issue 6/23 off vent, ready for tx to sdu - PCCM to  cont to follow as a consult for trach care  6/25 - TRH assumed care  7/3 - changed to cuffless tracheostomy tube  Subjective: Unable to fully understand the patient due to difficulty with communication secondary to tracheostomy.   Assessment/Plan:  PRES - severe hypertensive encephalopathy Stable. He is stable. Appears to be waxing and waning. Today, he appears to be little bit more distracted compared to yesterday. He remains on mittens for restraints last night. He may never return to his normal baseline.  Essential hypertension Blood pressure was significantly high at the time of admission. Blood pressure controlled at present time. Continue labetalol.  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 Trach care per PCCM. He was changed to cuffless trach 7/4. Ordered Bactrim by pulmonology due to yellowish secretions, suggesting tracheitis.  Right basilar VAP versus aspiration pneumonia (Klebsiella and Enterobacter) Has completed a course of antibiotics therapy  Nonoliguric acute kidney injury with chronic stage V kidney disease Baseline creatinine approximately 3.8 - creatinine appears to have stabilized   Hypernatremia Sodium has normalized with free water administration   Severe Protein calorie malnutrition in context of chronic illness NG tube placed 6/28 which patient promptly removed himself early morning 6/29 - small bore feeding tube placed 6/29 - wrist restraints/mittens to be used as needed to protect feeding tube. Continue sitter or pt will definitely remove his NG again. Continue TF. He has been changed to a cuffless tracheostomy tube. Speech therapy can work with him some more. Hopefully he will be able to take adequate nutrition by mouth. If not, he will need a PEG tube, although we should try and avoid as much as possible, considering that he may pull it  out.  Anemia of acute illness plus anemia of chronic kidney disease Hemoglobin did trend down, but stable now. There was apparently some oozing of blood from his tracheostomy site. No other overt losses noted. Continue to trend for now and transfuse if it drops below 7.   Mild hyperglycemia No prior history of diabetes - resolved  Severe physical debilitation PT OT is working with the patient.  DVT prophylaxis: SCDs Code Status: FULL Family Communication: no family present at bedside. Is unclear if any family is available. Disposition Plan: Not ready for discharge yet. Might require placement. His swallow function will have to be reassessed. He may need a PEG tube if he fails repeated swallow evaluation.  Consultants: Neurology  NS PCCM  Antibiotics: On Bactrim per pulmonology   Objective: Blood pressure 132/79, pulse 75, temperature 98.1 F (36.7 C), temperature source Oral, resp. rate 20, height 5\' 6"  (1.676 m), weight 47.5 kg (104 lb 11.5 oz), SpO2 100 %.  Intake/Output Summary (Last 24 hours) at 03/22/15 0832 Last data filed at 03/21/15 1757  Gross per 24 hour  Intake      0 ml  Output    775 ml  Net   -775 ml   Exam: General:  Alert. In no distress Lungs: Diminished air entry at the bases without any crackles or wheezing.  Neck:  Trach in place. Yellowish drainage is noted. Cardiovascular: Regular rate and rhythm without murmur gallop rub Abdomen: Nontender, nondistended, soft, bowel sounds positive, no rebound, no ascites, no appreciable mass Extremities: No significant cyanosis, clubbing, edema bilateral lower extremities Moving all his extremities. Alert.   Data Reviewed: Basic Metabolic Panel:  Recent Labs Lab 03/16/15 0225 03/17/15 0248 03/21/15 0425  NA 139 136 139  K 4.0 4.3 4.9  CL 106 105 106  CO2 21* 22 24  GLUCOSE 88 107* 108*  BUN 56* 51* 66*  CREATININE 4.03* 3.90* 3.77*  CALCIUM 8.7* 8.6* 8.6*  MG 2.1  --   --    CBC:  Recent  Labs Lab 03/16/15 0225 03/17/15 0248 03/19/15 0455 03/21/15 0425  WBC 9.7 14.1* 9.0 12.0*  NEUTROABS 8.1*  --   --   --   HGB 9.3* 8.7* 7.7* 7.8*  HCT 29.5* 27.3* 24.8* 24.5*  MCV 89.7 88.3 88.6 88.8  PLT 318 319 312 329    Liver Function Tests:  Recent Labs Lab 03/16/15 0225 03/17/15 0248  AST 25 27  ALT 20 21  ALKPHOS 65 66  BILITOT 0.8 0.6  PROT 7.3 7.2  ALBUMIN 2.4* 2.4*   Coags:  Recent Labs Lab 03/16/15 0225  INR 1.19    CBG:  Recent Labs Lab 03/21/15 1636 03/21/15 1959 03/21/15 2348 03/22/15 0351 03/22/15 0749  GLUCAP 95 110* 104* 98 98    Scheduled Meds:  Scheduled Meds: . amLODipine  10 mg Per Tube Daily  . free water  200 mL Per Tube 3 times per day  . labetalol  200 mg Per Tube BID  . sulfamethoxazole-trimethoprim  10 mL Oral Silvano [id number]      Kobee Medlen , MD  [phone]  [facility] Office  [phone] Pager - Text Page per Amion as per below:  On-Call/Text Page:      [email or web address]      password [email or web address]  If 7PM-7AM, please contact night-coverage [email or web address] Password [email or web address] 03/22/2015, 8:32 AM   LOS: 24 days

## 2015-03-22 NOTE — Progress Notes (Addendum)
PCCM NOTE  Subjective: Tolerating trach collar uncuffed.  Objective: BP 132/79 mmHg  Pulse 75  Temp(Src) 98.1 F (36.7 C) (Oral)  Resp 20  Ht 5\' 6"  (1.676 m)  Wt 104 lb 11.5 oz (47.5 kg)  BMI 16.91 kg/m2  SpO2 100% General: thin HEENT: trach site clean, Panda tube in place Cardiac: regular Chest: no wheeze Abd: soft Ext: no edema  CMP Latest Ref Rng 03/21/2015 03/17/2015 03/16/2015  Glucose 65 - 99 mg/dL 161(W108(H) 960(A107(H) 88  BUN 6 - 20 mg/dL 54(U66(H) 98(J51(H) 19(J56(H)  Creatinine 0.61 - 1.24 mg/dL 4.78(G3.77(H) 9.56(O3.90(H) 1.30(Q4.03(H)  Sodium 135 - 145 mmol/L 139 136 139  Potassium 3.5 - 5.1 mmol/L 4.9 4.3 4.0  Chloride 101 - 111 mmol/L 106 105 106  CO2 22 - 32 mmol/L 24 22 21(L)  Calcium 8.9 - 10.3 mg/dL 6.5(H8.6(L) 8.4(O8.6(L) 9.6(E8.7(L)  Total Protein 6.5 - 8.1 g/dL - 7.2 7.3  Total Bilirubin 0.3 - 1.2 mg/dL - 0.6 0.8  Alkaline Phos 38 - 126 U/L - 66 65  AST 15 - 41 U/L - 27 25  ALT 17 - 63 U/L - 21 20    CBC Latest Ref Rng 03/21/2015 03/19/2015 03/17/2015  WBC 4.0 - 10.5 K/uL 12.0(H) 9.0 14.1(H)  Hemoglobin 13.0 - 17.0 g/dL 7.8(L) 7.7(L) 8.7(L)  Hematocrit 39.0 - 52.0 % 24.5(L) 24.8(L) 27.3(L)  Platelets 150 - 400 K/uL 329 312 319    Dg Abd 1 View  03/20/2015   CLINICAL DATA:  Nasogastric tube placement.  Initial encounter.  EXAM: ABDOMEN - 1 VIEW  COMPARISON:  Abdominal radiograph performed 03/16/2015  FINDINGS: The patient's enteric tube is seen ending overlying the body of the stomach.  The visualized bowel gas pattern is grossly unremarkable, with scattered stool and air noted in the colon, and scattered air-filled loops of small bowel. There is no evidence of bowel dilatation to suggest obstruction. No free intra-abdominal air is seen, though evaluation for free air is limited on a single supine view. Minimal residual contrast is seen within colonic diverticula and at the sigmoid colon.  No acute osseous abnormalities are identified. The visualized lung bases are grossly clear.  IMPRESSION: Enteric tube seen  ending overlying the body of the stomach.   Electronically Signed   By: Roanna RaiderJeffery  Chang M.D.   On: 03/20/2015 23:12    Assessment: 64 yo male with hx of cocaine abuse presented with HTN emergency, PRES, SDH, VDRF with failure to wean s/p tracheostomy 6/22 by Dr. Tyson AliasFeinstein.  Hospital course complicated by HCAP.  Had trach downsized to #4 cuffless on 7/04.  Plan: Continue #4 cuffless trach Continue to work with speech therapy >> defer decannulation attempts until after swallow assessment improved  PCCM will see once per week for trach management.  Call if help needed sooner.  Coralyn HellingVineet Jabir Dahlem, MD [facility] 03/22/2015, 7:03 AM Pager:  [phone] After 3pm call: [phone]

## 2015-03-22 NOTE — Progress Notes (Signed)
SPEECH PATHOLOGY:   MBS COMPLETED CLINICAL IMPRESSION:  Pt presents with improved swallow function since MBS 6/28, such that a modified oral diet may be started.  Pt now with #4 cuffless trach; he used PMV during the assessment.  He had difficulty masticating mechanical solids, which led to piecemeal bolusing and unchewed bolus particles residing in pharynx.  Purees were tolerated with excellent clearance through pharyngeal space.  Thin liquids were immediately aspirated after spilling prematurely into the pyriforms; nectar-thick liquids were also intermittently aspirated.  Aspiration did not consistently elicit a cough response.  Honey thick liquids were  consumed over multiple trials with no penetration/aspiration.  Recommend initiating a dysphagia 1 diet with honey-thick liquids; crush meds in puree; D/C NG.  PEG no longer indicated.  SLP will follow for toleration/diet advancement.   Ledonna Dormer L. Samson Fredericouture, [place] CCC/SLP Pager [phone]

## 2015-03-22 NOTE — Progress Notes (Signed)
PT Cancellation Note  Patient Details Name: Lee Swanson MRN: [id number] DOB: 1951/07/21   Cancelled Treatment:    Reason Eval/Treat Not Completed: Patient at procedure or test/unavailable Pt off floor for swallow test. Will follow up next available time.   Blake DivineShauna A Myeshia Fojtik 03/22/2015, 1:35 PM Mylo RedShauna Anai Lipson, PT, DPT [phone]

## 2015-03-23 LAB — GLUCOSE, CAPILLARY
GLUCOSE-CAPILLARY: 204 mg/dL — AB (ref 65–99)
GLUCOSE-CAPILLARY: 132 mg/dL — AB (ref 65–99)
Glucose-Capillary: 102 mg/dL — ABNORMAL HIGH (ref 65–99)
Glucose-Capillary: 148 mg/dL — ABNORMAL HIGH (ref 65–99)
Glucose-Capillary: 84 mg/dL (ref 65–99)
Glucose-Capillary: 95 mg/dL (ref 65–99)

## 2015-03-23 LAB — CBC
HEMATOCRIT: 24.3 % — AB (ref 39.0–52.0)
Hemoglobin: 7.5 g/dL — ABNORMAL LOW (ref 13.0–17.0)
MCH: 27.6 pg (ref 26.0–34.0)
MCHC: 30.9 g/dL (ref 30.0–36.0)
MCV: 89.3 fL (ref 78.0–100.0)
PLATELETS: 333 10*3/uL (ref 150–400)
RBC: 2.72 MIL/uL — AB (ref 4.22–5.81)
RDW: 14.3 % (ref 11.5–15.5)
WBC: 10.9 10*3/uL — ABNORMAL HIGH (ref 4.0–10.5)

## 2015-03-23 LAB — BASIC METABOLIC PANEL
Anion gap: 9 (ref 5–15)
BUN: 70 mg/dL — ABNORMAL HIGH (ref 6–20)
CO2: 25 mmol/L (ref 22–32)
CREATININE: 4.43 mg/dL — AB (ref 0.61–1.24)
Calcium: 8.7 mg/dL — ABNORMAL LOW (ref 8.9–10.3)
Chloride: 108 mmol/L (ref 101–111)
GFR calc Af Amer: 15 mL/min — ABNORMAL LOW (ref >60)
GFR calc non Af Amer: 13 mL/min — ABNORMAL LOW (ref >60)
Glucose, Bld: 85 mg/dL (ref 65–99)
Potassium: 5.1 mmol/L (ref 3.5–5.1)
Sodium: 142 mmol/L (ref 135–145)

## 2015-03-23 NOTE — Progress Notes (Signed)
Physical Therapy Treatment Patient Details Name: ASPEN DETERDING MRN: [id number] DOB: [birth year] Today's Date: 03/23/2015    History of Present Illness Pt is a 64 y/o male with a PMH of med noncompliance, HTN, tobacco/cocaine/ETOH, CKD, hep C, Barrett's esophagus. Pt [facility] after he was found down for an unknown period of time. BP was noted to be 250/155. An MRI was consistent with posterior reversible encephalopathy syndrome with profound edema throughout the brain stem and an acute left subarachnoid hemorrhage. A CT scan of the head showed a small left subdural hematoma. Neurosurgery was consulted although surgery was not felt to be indicated. Neurology has been following until MRI showed increasing strokes. He was transferred to the stroke team 03/08/2015 for continued followup.     PT Comments    Slow progress towards physical therapy goals. Mod assistance +2 in order to safely transfer, practiced with a rolling walker today for support and pt had an episode of bowel incontinence. Improved bed mobility to a min guard level for safety however still demonstrating poor seated balance control at times. Patient will continue to benefit from skilled physical therapy services to further improve independence with functional mobility.   Follow Up Recommendations  LTACH (vs SNF)     Equipment Recommendations  Wheelchair (measurements PT);Wheelchair cushion (measurements PT);Rolling walker with 5" wheels    Recommendations for Other Services       Precautions / Restrictions Precautions Precautions: Fall Restrictions Weight Bearing Restrictions: No    Mobility  Bed Mobility Overal bed mobility: Needs Assistance Bed Mobility: Supine to Sit Rolling: Min guard   Supine to sit: Min guard     General bed mobility comments: Min guard for safety with VC for technique to use rail and bring LEs off of bed. No physical assist required.  Transfers Overall transfer level:  Needs assistance Equipment used: Rolling walker (2 wheeled) Transfers: Sit to/from [employer] Sit to Stand: Mod assist;+2 physical assistance Stand pivot transfers: Mod assist;+2 physical assistance       General transfer comment: Mod assist for boost to stand. LEs needed support for placement prior to standing. Max VC for sequencing. Very poor coordination of LEs with pivot from bed to chair, chair to [facility], and BSC to chair. Pt had bowel incontinence twice during transfers. Mod assist for walker control and +2 assist for balance once upright.   Ambulation/Gait                 Stairs            Wheelchair Mobility    Modified Rankin (Stroke Patients Only) Modified Rankin (Stroke Patients Only) Pre-Morbid Rankin Score: No significant disability (Per chart review, but not clear PLOF.) Modified Rankin: Severe disability     Balance     Sitting balance-Leahy Scale: Poor Sitting balance - Comments: pt tends to lean to R side and anteriorly.   Standing balance support: Bilateral upper extremity supported Standing balance-Leahy Scale: Poor Standing balance comment: Stood x2 mintues while performing pericare after bowel movement. Pt became fatigued and agitated while standing. encouraged to focus on upright posture with tactile cues, minimally sustained duration.                    Cognition Arousal/Alertness: Awake/alert Behavior During Therapy: Flat affect;Agitated Overall Cognitive Status: Difficult to assess (aware of month and location. Not sure of year)  Exercises      General Comments General comments (skin integrity, edema, etc.): Bowel incontinence in standing while working with therapy. pericare performed, RN notified.      Pertinent Vitals/Pain Pain Assessment: 0-10 Pain Score:  (no value given) Pain Descriptors / Indicators: Grimacing Pain Intervention(s): Monitored during session;Repositioned     Home Living                      Prior Function            PT Goals (current goals can now be found in the care plan section) Acute Rehab PT Goals Patient Stated Goal: none stated PT Goal Formulation: Patient unable to participate in goal setting Time For Goal Achievement: 03/24/15 Potential to Achieve Goals: Good Progress towards PT goals: Progressing toward goals    Frequency  Min 2X/week    PT Plan Current plan remains appropriate    Co-evaluation             End of Session Equipment Utilized During Treatment: Gait belt;Oxygen Activity Tolerance: Patient limited by fatigue Patient left: in chair;with call bell/phone within reach;with chair alarm set     Time: [id number] PT Time Calculation (min) (ACUTE ONLY): 33 min  Charges:  $Therapeutic Activity: 23-37 mins                    G Codes:      Berton MountBarbour, [place] Moss S 03/23/2015, 4:38 PM Sunday SpillersLogan Secor [place], [place] [phone]

## 2015-03-23 NOTE — Progress Notes (Signed)
Speech Language Pathology Treatment: Dysphagia;Passy Muir Speaking valve  Patient Details Name: Lee Swanson MRN: [id number] DOB: [birth year] Today's Date: 03/23/2015 Time: 1208-1225 SLP Time Calculation (min) (ACUTE ONLY): 17 min  Assessment / Plan / Recommendation Clinical Impression  Pt using PMV upon entering room.  Continues to tolerate well with stable vital signs, oxygenating at 100%.  Min verbal cues to increase volume/clarity.  Assisted pt with lunch.  Requires mod cues to slow down and reduce bolus size; without monitoring, pt's impulsivity leads to intermittent coughing with POs.  After cued, toleration improved with fewer s/s of aspiration.  Lungs are clear but diminished; pt is afebrile. Recommend continue dysphagai 1, honey-thick liquids - will likely be D/Cd on this diet.  Wear PMV all waking hours and with meals. Will follow.    HPI Other Pertinent Information: 64 yr old with hx of substance abuse, medical non compliance, HTN. CRD, suicidal Ideation admitted after being found down for unknown time, extremely hypertensive 250/155 and intubated. Recieved trach 6/22, trach collar 6/23. Per MD note pt cocaine + on admit. 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 Repeat MRI progressive infarcts with new infarcts posterior right frontal, left parietal regin, right thalamus and bilateral cerebellum.  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 Acute left perimesencephalic subarachnoid hemorrhage.   Pertinent Vitals Pain Assessment: 0-10 Pain Score: 10-Worst pain ever Pain Location: buttocks Pain Intervention(s): Repositioned  SLP Plan  Continue with current plan of care    Recommendations Diet recommendations: Dysphagia 1 (puree);Honey-thick  liquid Liquids provided via: Cup;Teaspoon Medication Administration: Crushed with puree Supervision: Full supervision/cueing for compensatory strategies Compensations: Slow rate;Small sips/bites Postural Changes and/or Swallow Maneuvers: Seated upright 90 degrees      Patient may use Passy-Muir Speech Valve: During all waking hours (remove during sleep) PMSV Supervision: Intermittent       Follow up Recommendations: Skilled Nursing facility Plan: Continue with current plan of care   Nera Haworth L. Samson Fredericouture, [place] CCC/SLP Pager [phone]      Blenda MountsCouture, Tamecca Artiga Laurice 03/23/2015, 12:26 PM

## 2015-03-23 NOTE — Progress Notes (Signed)
UR completed 

## 2015-03-23 NOTE — Progress Notes (Signed)
Progress Note  Lee Swanson ZOX:[id number] DOB: 08/19/1951 DOA: 02/26/2015  PCP: No primary care provider on file.  Admit HPI / Brief Narrative: 56 M with hx of substance abuse, medical non compliance, HTN, CKD, and suicidalideation who was found down for unknown time, breathing, extremely hypertensive 250/155, and was transported to [facility] ED. CT noted small SDH, started on nipride, intubated. NS consulted and MRI ordered. He was cocaine + on admit. PCCM admitted to ICU.    Significant Events: 6/11 Intubated in ED 6/11 CT head: Changes suggestive of a small subdural hematoma along the left lateral midbrain 6/11 MRI brain: Profound edema throughout the brainstem, also with involvement of the cerebellum and posterior aspects of the cerebral hemispheres, quite likely to represent PRES with predominant brainstem involvement. The differential diagnosis includes toxic demyelination, but that is felt less likely 6/12 renal US: Echogenic renal parenchyma bilaterally, compatible with medical renal disease. No hydronephrosis 6/12 Neurology consultation: PRES vs toxic leukoencephalopathy  6/14 More responsive. Fever, copious purulent secretions. Resp culture and empiric abx initiated. Precedex initiated 6/15 Tolerates PS 5-10 cm H2O. Poor mentation and copious ET secretions prohibited extubation 6/16 Failed extubation almost immediately due to poor handling of oropharyngeal secretions 6/17 Tolerates PS 5-10 cm H2O. + F/C 6/19 MRI brain repeat > Improved PRES, progressive infarcts with new infarcts noted involving portions of the posterior right frontal lobe, left periatrial region, right thalamus and right cerebellum with persistent acute/ subacute infarcts involving the left periatrial region, left thalamus and left cerebellum. Tiny amount hemorrhage associated with the left cerebellar infarct not entirely excluded. 6/22 trach, secretions remain an issue 6/23 off vent, ready for tx to sdu - PCCM to  cont to follow as a consult for trach care  6/25 - TRH assumed care  7/3 - changed to cuffless tracheostomy tube 7/5 passed swallow eval, placed on dysphagia 1 with honey thick.  Subjective: Patient was eating ice cream this morning when I interviewed him, communicating well using Passy-Muir's valve Denies any shortness of breath or other complaints.  Assessment/Plan:  PRES - severe hypertensive encephalopathy Patient presented to the hospital with blood pressure over 250/150 and severe hypertensive encephalopathy. Blood pressure is controlled, continue current medications. He remains on mittens for restraints since 2 days ago, I will discontinue sitter and mittens.  Essential hypertension Blood pressure was significantly high at the time of admission. Blood pressure controlled at present time. Continue labetalol.  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 Trach care per PCCM. He was changed to cuffless trach 7/4.  Ordered Bactrim by pulmonology due to yellowish secretions, suggesting tracheitis. Discussed with SLP, placed on dysphagia 1 diet. She using Passy-Muir. I will let PCCM know.  Right basilar VAP versus aspiration pneumonia (Klebsiella and Enterobacter) Has completed a course of antibiotics therapy  Nonoliguric acute kidney injury with chronic stage V kidney disease Baseline creatinine approximately 3.8 - creatinine appears to have stabilized   Hypernatremia Sodium has normalized with free water administration   Severe Protein calorie malnutrition in context of chronic illness NG tube placed 6/28 which patient promptly removed himself early morning 6/29 - small bore feeding tube placed 6/29 - wrist restraints/mittens to be used as needed to protect feeding tube. Continue sitter or pt will definitely remove his NG again. Continue TF. He has been changed  to a cuffless tracheostomy tube. Speech therapy can work with him some more. Hopefully  he will be able to take adequate nutrition by mouth. If not, he will need a PEG tube, although we should try and avoid as much as possible, considering that he may pull it out.  Anemia of acute illness plus anemia of chronic kidney disease Hemoglobin did trend down, but stable now. There was apparently some oozing of blood from his tracheostomy site. No other overt losses noted. Continue to trend for now and transfuse if it drops below 7.   Mild hyperglycemia No prior history of diabetes - resolved  Severe physical debilitation PT/OT recommended LTAC versus SNF.   DVT prophylaxis: SCDs Code Status: FULL Family Communication: no family present at bedside. Is unclear if any family is available. Disposition Plan: He will likely need SNF.  Consultants: Neurology  NS PCCM  Antibiotics: On Bactrim per pulmonology  Objective: Blood pressure 130/72, pulse 74, temperature 98.7 F (37.1 C), temperature source Oral, resp. rate 18, height 5\' 6"  (1.676 m), weight 47.6 kg (104 lb 15 oz), SpO2 97 %.  Intake/Output Summary (Last 24 hours) at 03/23/15 1242 Last data filed at 03/23/15 1043  Gross per 24 hour  Intake    960 ml  Output   1250 ml  Net   -290 ml   Exam: General:  Alert. In no distress Lungs: Diminished air entry at the bases without any crackles or wheezing.  Neck:  Trach in place. Yellowish drainage is noted. Cardiovascular: Regular rate and rhythm without murmur gallop rub Abdomen: Nontender, nondistended, soft, bowel sounds positive, no rebound, no ascites, no appreciable mass Extremities: No significant cyanosis, clubbing, edema bilateral lower extremities Moving all his extremities. Alert.   Data Reviewed: Basic Metabolic Panel:  Recent Labs Lab 03/17/15 0248 03/21/15 0425 03/23/15 0506  NA 136 139 142  K 4.3 4.9 5.1  CL 105 106 108  CO2 22 24 25   GLUCOSE 107* 108* 85  BUN  51* 66* 70*  CREATININE 3.90* 3.77* 4.43*  CALCIUM 8.6* 8.6* 8.7*   CBC:  Recent Labs Lab 03/17/15 0248 03/19/15 0455 03/21/15 0425 03/23/15 0506  WBC 14.1* 9.0 12.0* 10.9*  HGB 8.7* 7.7* 7.8* 7.5*  HCT 27.3* 24.8* 24.5* 24.3*  MCV 88.3 88.6 88.8 89.3  PLT 319 312 329 333    Liver Function Tests:  Recent Labs Lab 03/17/15 0248  AST 27  ALT 21  ALKPHOS 66  BILITOT 0.6  PROT 7.2  ALBUMIN 2.4*   Coags: No results for input(s): INR in the last 168 hours.  Invalid input(s): PT  CBG:  Recent Labs Lab 03/22/15 2221 03/23/15 0002 03/23/15 0354 03/23/15 0902 03/23/15 1124  GLUCAP 103* 95 84 148* 204*    Scheduled Meds:  Scheduled Meds: . amLODipine  10 mg Per Tube Daily  . labetalol  200 mg Per Tube BID  . sulfamethoxazole-trimethoprim  10 mL Oral Q12H      Clint LippsELMAHI,Kirsty Monjaraz A , MD  [phone]  [facility] Office  [phone] Pager - Text Page per Amion as per below:  On-Call/Text Page:      [email or web address]      password [email or web address]  If 7PM-7AM, please contact night-coverage [email or web address] Password [email or web address] 03/23/2015, 12:42 PM   LOS: 25 days

## 2015-03-24 DIAGNOSIS — Z4659 Encounter for fitting and adjustment of other gastrointestinal appliance and device: Secondary | ICD-10-CM | POA: Diagnosis present

## 2015-03-24 DIAGNOSIS — R0989 Other specified symptoms and signs involving the circulatory and respiratory systems: Secondary | ICD-10-CM

## 2015-03-24 DIAGNOSIS — Z87898 Personal history of other specified conditions: Secondary | ICD-10-CM

## 2015-03-24 DIAGNOSIS — R1314 Dysphagia, pharyngoesophageal phase: Secondary | ICD-10-CM

## 2015-03-24 DIAGNOSIS — Z430 Encounter for attention to tracheostomy: Secondary | ICD-10-CM

## 2015-03-24 DIAGNOSIS — J9811 Atelectasis: Secondary | ICD-10-CM | POA: Diagnosis present

## 2015-03-24 LAB — CBC
HCT: 23.8 % — ABNORMAL LOW (ref 39.0–52.0)
HEMOGLOBIN: 7.5 g/dL — AB (ref 13.0–17.0)
MCH: 28.6 pg (ref 26.0–34.0)
MCHC: 31.5 g/dL (ref 30.0–36.0)
MCV: 90.8 fL (ref 78.0–100.0)
PLATELETS: 352 10*3/uL (ref 150–400)
RBC: 2.62 MIL/uL — ABNORMAL LOW (ref 4.22–5.81)
RDW: 14.4 % (ref 11.5–15.5)
WBC: 9 10*3/uL (ref 4.0–10.5)

## 2015-03-24 LAB — GLUCOSE, CAPILLARY
GLUCOSE-CAPILLARY: 124 mg/dL — AB (ref 65–99)
GLUCOSE-CAPILLARY: 97 mg/dL (ref 65–99)
Glucose-Capillary: 86 mg/dL (ref 65–99)
Glucose-Capillary: 117 mg/dL — ABNORMAL HIGH (ref 65–99)

## 2015-03-24 LAB — BASIC METABOLIC PANEL
ANION GAP: 10 (ref 5–15)
BUN: 73 mg/dL — ABNORMAL HIGH (ref 6–20)
CALCIUM: 8.6 mg/dL — AB (ref 8.9–10.3)
CO2: 23 mmol/L (ref 22–32)
Chloride: 108 mmol/L (ref 101–111)
Creatinine, Ser: 4.59 mg/dL — ABNORMAL HIGH (ref 0.61–1.24)
GFR calc Af Amer: 14 mL/min — ABNORMAL LOW (ref >60)
GFR, EST NON AFRICAN AMERICAN: 12 mL/min — AB (ref >60)
Glucose, Bld: 73 mg/dL (ref 65–99)
POTASSIUM: 5.5 mmol/L — AB (ref 3.5–5.1)
SODIUM: 141 mmol/L (ref 135–145)

## 2015-03-24 LAB — PREPARE RBC (CROSSMATCH)

## 2015-03-24 MED ORDER — SODIUM CHLORIDE 0.9 % IV SOLN: Freq: Once | INTRAVENOUS | Status: DC

## 2015-03-24 MED ORDER — PANTOPRAZOLE SODIUM 40 MG PO TBEC
40.0000 mg | DELAYED_RELEASE_TABLET | Freq: Two times a day (BID) | ORAL | Status: DC
  Filled 2015-03-24: qty 1

## 2015-03-24 MED ORDER — ADULT MULTIVITAMIN W/MINERALS CH
1.0000 | ORAL_TABLET | Freq: Every day | ORAL | Status: DC
  Filled 2015-03-24: qty 1

## 2015-03-24 NOTE — Progress Notes (Signed)
Removed trach per MD order.  Placed covering over stoma.  Patient is tolerating well with 02 saturation of 99% on room air.  HR 63, RR 16.  RN is aware.

## 2015-03-24 NOTE — Progress Notes (Signed)
Nutrition Follow-up  DOCUMENTATION CODES:  Underweight, Severe malnutrition in context of chronic illness  INTERVENTION:  Magic cup, Snacks, MVI   May want to consider/discuss PEG for nocturnal tube feeds to improve nutritional status if pt is unable to maintain/gain weight  NUTRITION DIAGNOSIS:  Malnutrition related to chronic illness, inability to eat as evidenced by estimated needs, NPO status, severe depletion of muscle mass, severe depletion of body fat.  Ongoing  GOAL:  Patient will meet greater than or equal to 90% of their needs  Unmet  MONITOR:  PO intake, Supplement acceptance, Labs, Weight trends, Skin, I & O's  REASON FOR ASSESSMENT:  Consult Assessment of nutrition requirement/status  ASSESSMENT: Patient admitted on 6/11 with AMS, found unresponsive, HTN crisis, and SAH. MRI head showed brain stem edema, micro bleeds. S/P trach on 6/22. Currently on trach collar.  Per nursing notes, pt is eating 75% of some meals, refusing some meals. Weight has decreased 2 lbs in the past few days. Pt states he declined breakfast this morning because food doesn't taste good to him; asking for thickened cola. States he likes the magic cup ice cream but, would rather have coke. RD encouraged PO intake, pt can have both coke and ice cream.   Labs: low GFR, high BUN/creatinine, low calcium, elevated potassium, low hemoglobin  Height:  Ht Readings from Last 1 Encounters:  03/16/15 5\' 6"  (1.676 m)    Weight:  Wt Readings from Last 1 Encounters:  03/24/15 102 lb 1.2 oz (46.3 kg)    Ideal Body Weight:  70 kg  Wt Readings from Last 10 Encounters:  03/24/15 102 lb 1.2 oz (46.3 kg)  02/08/15 115 lb (52.164 kg)  01/29/15 115 lb 11.2 oz (52.481 kg)    BMI:  Body mass index is 16.48 kg/(m^2). (Underweight)  Estimated Nutritional Needs:  Kcal:  1700-1900  Protein:  65-80 grams  Fluid:  1.7-1.9 L/day  Skin:  Reviewed, no issues  Diet Order:  DIET - DYS 1 Room  service appropriate?: Yes; Fluid consistency:: Honey Thick  EDUCATION NEEDS:  No education needs identified at this time   Intake/Output Summary (Last 24 hours) at 03/24/15 1349 Last data filed at 03/24/15 0940  Gross per 24 hour  Intake    240 ml  Output   1200 ml  Net   -960 ml    Last BM:  7/6  Ian Malkineanne Barnett RD, LDN Inpatient Clinical Dietitian Pager: [phone] After Hours Pager: [phone]

## 2015-03-24 NOTE — Progress Notes (Signed)
PCCM NOTE  Subjective:  Tolerating trach collar uncuffed with PMV during all waking hours. Has worked with speech therapy, doing well with dysphagia 1 diet since 03/22/15 - thick liquids.  No cough / secretions.  Vitals stable.  Has been OOB to chair daily without problems.  Seen by PT who currently recommends SNF vs LTACH.  Objective: BP 147/76 mmHg  Pulse 70  Temp(Src) 98.1 F (36.7 C) (Oral)  Resp 20  Ht  (1.676 m)  Wt 46.3 kg (102 lb 1.2 oz)  BMI 16.48 kg/m2  SpO2 97%   General: thin adult male, in NAD. HEENT: trach site clean / dry / intact. Cardiac: regular, no M/R/G. Chest: no wheeze. Abd: BS x 4, abd soft. Ext: no edema.  CMP Latest Ref Rng 03/24/2015 03/23/2015 03/21/2015  Glucose 65 - 99 mg/dL 73 85 604(V)  BUN 6 - 20 mg/dL 40(J) 81(X) 91(Y)  Creatinine 0.61 - 1.24 mg/dL 7.82(N) 5.62(Z) 3.08(M)  Sodium 135 - 145 mmol/L 141 142 139  Potassium 3.5 - 5.1 mmol/L 5.5(H) 5.1 4.9  Chloride 101 - 111 mmol/L 108 108 106  CO2 22 - 32 mmol/L Calcium 8.9 - 10.3 mg/dL 5.7(Q) 4.6(N) 6.2(X)  Total Protein 6.5 - 8.1 g/dL - - -  Total Bilirubin 0.3 - 1.2 mg/dL - - -  Alkaline Phos 38 - 126 U/L - - -  AST 15 - 41 U/L - - -  ALT 17 - 63 U/L - - -    CBC Latest Ref Rng 03/24/2015 03/23/2015 03/21/2015  WBC 4.0 - 10.5 K/uL 9.0 10.9(H) 12.0(H)  Hemoglobin 13.0 - 17.0 g/dL 7.5(L) 7.5(L) 7.8(L)  Hematocrit 39.0 - 52.0 % 23.8(L) 24.3(L) 24.5(L)  Platelets 150 - 400 K/uL 352 333 329    Dg Swallowing Func-speech Pathology  03/22/2015    Objective Swallowing Evaluation:    Patient Details  Name: Lee Swanson MRN: [id number] Date of Birth: [birth year]  Today's Date: 03/22/2015 Time: SLP Start Time (ACUTE ONLY): 1330-SLP Stop Time (ACUTE ONLY): 1400 SLP Time Calculation (min) (ACUTE ONLY): 30 min  Past Medical History:  Past Medical History  Diagnosis Date  . Chronic kidney disease     Stage IV  . Hypokalemia   . Thrombocytopenia   . Hypertension 01/2015.    Hypertensive emergency.  .  Hepatitis C antibody test positive 01/25/2015    HIV testing negative on the same date.  . Elevated troponin I level 01/2015.    Felt to be secondary to demand ischemia in setting of hypertension and  cocaine use. Cardiac cath not planned given kidney disease  . Vitamin D deficiency 01/2015.  . Barrett's esophagus 02/05/2015    EGD 01/2015 - anticipate repeat EGD 2019   Past Surgical History:  Past Surgical History  Procedure Laterality Date  . Esophagogastroduodenoscopy N/A 01/27/2015    Procedure: ESOPHAGOGASTRODUODENOSCOPY (EGD);  Surgeon: Iva Boop,  MD;  Location: [facility] ENDOSCOPY;  Service: Endoscopy;  Laterality: N/A;   HPI:  Other Pertinent Information: 64 yr old with hx of substance abuse,  medical non compliance, HTN. CRD, suicidal Ideation admitted after being  found down for unknown time, extremely hypertensive 250/155 and intubated.  Recieved trach 6/22, trach collar 6/23. Per MD note pt cocaine + on admit.  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 Repeat MRI progressive infarcts  with new infarcts  posterior right frontal, left parietal regin, right thalamus and bilateral  cerebellum.  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 Acute left perimesencephalic  subarachnoid hemorrhage.  No Data Recorded  Assessment / Plan / Recommendation CHL IP CLINICAL IMPRESSIONS 03/22/2015  Therapy Diagnosis Mild pharyngeal phase dysphagia;Mild oral phase  dysphagia  Clinical Impression Pt presents with improved swallow function since MBS  6/28, such that a modified oral diet may be started.  Pt now with #4  cuffless trach; he used PMV during the assessment.  He had difficulty  masticating mechanical solids, which led to piecemeal bolusing and  unchewed bolus particles residing  in pharynx.  Purees were tolerated with  excellent clearance through pharyngeal space.  Thin liquids were  immediately aspirated after spilling prematurely into the pyriforms;  nectar-thick liquids were also intermittently aspirated.  Aspiration did  not consistently elicit a cough response.  Honey thick liquids were   consumed over multiple trials with no penetration/aspiration.  Recommend  initiating a dysphagia 1 diet with honey-thick liquids; crush meds in  puree; D/C NG.  PEG no longer indicated.  SLP will follow for  toleration/diet advancement.       CHL IP TREATMENT RECOMMENDATION 03/22/2015  Treatment Recommendations Therapy as outlined in treatment plan below     CHL IP DIET RECOMMENDATION 03/22/2015  SLP Diet Recommendations Dysphagia 1 (Puree)  Liquid Administration via (None)  Medication Administration Crushed with puree  Compensations Slow rate;Small sips/bites  Postural Changes and/or Swallow Maneuvers (None)     CHL IP OTHER RECOMMENDATIONS 03/22/2015  Recommended Consults (None)  Oral Care Recommendations Oral care BID  Other Recommendations Order thickener from pharmacy     CHL IP FOLLOW UP RECOMMENDATIONS 03/22/2015  Follow up Recommendations [facility] D[facility]  Speech Therapy Frequency (ACUTE ONLY) min 2x/week  Treatment Duration (None)         SLP Swallow Goals No flowsheet data found.  No flowsheet data found.              No flowsheet data found.  No flowsheet data found.         Blenda MountsCouture, Amanda Laurice 03/22/2015, 2:16 PM     Assessment: 64 yo male with hx of cocaine abuse presented with HTN emergency, PRES, SDH, VDRF with failure to wean s/p tracheostomy 6/22 by Dr. Tyson AliasFeinstein.  Hospital course complicated by HCAP.  Had trach downsized to #4 cuffless on 7/04.  Had swallow eval 03/22/15 and passed for dysphagia 1 with honey thick liquids.  Has tolerated well since as well as PMV during all waking hours.  Plan: Likely ready for decannulation.  Will  discuss with attending further. Continue to work with ST / PT. Mobilize as able.   Rutherford Guysahul Penny Arrambide, [place]PA - [facility] Pager: [phone]  or [phone] 03/24/2015, 10:32 AM

## 2015-03-24 NOTE — Progress Notes (Signed)
Second attempt discussing blood transfusion with patient, explaining benefits and risks.  He continues to refuse transfusion stating "I just don't want it!"

## 2015-03-24 NOTE — Progress Notes (Signed)
Speech Language Pathology Treatment: Dysphagia  Patient Details Name: Armandina Stammerreston R Mestre MRN: [id number] DOB: July 22, 1951 Today's Date: 03/24/2015 Time: 1006-1030 SLP Time Calculation (min) (ACUTE ONLY): 24 min  Assessment / Plan / Recommendation Clinical Impression  Pt sitting upright in chair - per nurse tech, pt requesting plain water.  Displeasure with thickened liquids reported by pt.  SLP reviewed reasoning for diet modification due to SILENT nature of dysphagia/aspiration. Pt reported he "could control it", reviewed trach tube, neuro diagnosis impact on sensation.  Using teach back, pt able to verbalize precautions.   SLP encouraged intake as a long term feeding tube has been rescinded.  SLP thickened Cola of which pt consumed approximately 4 ounces.  He consumed SMALL boluses with mod I.  As pt is saline locked (not currently receiving artificial hydration) and observed to tolerate honey liquids alone well, recommend intermittent supervision with liquids and full supervision with meals.  SLP left Cola on tray within pt's reach.  Pt is afebrile and consumption listed as 75%.    Recommend continue diet with strict precautions.  Will continue for dysphagia management.  Instrumental swallow evaluation indicated prior to change of diet due to silent nature of dysphagia.    Note:  Critical care PA arrived and note possible plan for dc trach today.     HPI Other Pertinent Information: 64 yr old with hx of substance abuse, medical non compliance, HTN. CRD, suicidal Ideation admitted after being found down for unknown time, extremely hypertensive 250/155 and intubated. Recieved trach 6/22, trach collar 6/23. Per MD note pt cocaine + on admit. 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 Repeat MRI progressive  infarcts with new infarcts posterior right frontal, left parietal regin, right thalamus and bilateral cerebellum.  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 Acute left perimesencephalic subarachnoid hemorrhage   Pertinent Vitals Pain Assessment: No/denies pain  SLP Plan  Continue with current plan of care    Recommendations Diet recommendations: Dysphagia 1 (puree);Honey-thick liquid Liquids provided via: Cup;Teaspoon Medication Administration: Whole meds with puree Supervision: Full supervision/cueing for compensatory strategies (intermittent supervision with liquids alone, full supervision with meals) Compensations: Slow rate;Small sips/bites Postural Changes and/or Swallow Maneuvers: Seated upright 90 degrees      Patient may use Passy-Muir Speech Valve: During all waking hours (remove during sleep) PMSV Supervision: Intermittent       Oral Care Recommendations: Oral care BID Follow up Recommendations: Skilled Nursing facility Plan: Continue with current plan of care    GO    Donavan Burnetamara Margret Moat, MS [facility] SLP [phone]

## 2015-03-24 NOTE — Clinical Social Work Note (Signed)
CSW spoke with the CSW's assistant director regarding placement options for the pt. CSW's Chiropodistassistant director and CSW discussed extending the SNF search. CSW will continue follow and assist with discharge needs.   Mykenzie Ebanks, MSW, LCSWA [phone]

## 2015-03-24 NOTE — Progress Notes (Signed)
Progress Note  Lee Swanson ZOX:[id number] DOB: 1951/04/13 DOA: 02/26/2015  PCP: No primary care provider on file.  Admit HPI / Brief Narrative: 19 M with hx of substance abuse, medical non compliance, HTN, CKD, and suicidalideation who was found down for unknown time, breathing, extremely hypertensive 250/155, and was transported to [facility] ED. CT noted small SDH, started on nipride, intubated. NS consulted and MRI ordered. He was cocaine + on admit. PCCM admitted to ICU.    Significant Events: 6/11 Intubated in ED 6/11 CT head: Changes suggestive of a small subdural hematoma along the left lateral midbrain 6/11 MRI brain: Profound edema throughout the brainstem, also with involvement of the cerebellum and posterior aspects of the cerebral hemispheres, quite likely to represent PRES with predominant brainstem involvement. The differential diagnosis includes toxic demyelination, but that is felt less likely 6/12 renal US: Echogenic renal parenchyma bilaterally, compatible with medical renal disease. No hydronephrosis 6/12 Neurology consultation: PRES vs toxic leukoencephalopathy  6/14 More responsive. Fever, copious purulent secretions. Resp culture and empiric abx initiated. Precedex initiated 6/15 Tolerates PS 5-10 cm H2O. Poor mentation and copious ET secretions prohibited extubation 6/16 Failed extubation almost immediately due to poor handling of oropharyngeal secretions 6/17 Tolerates PS 5-10 cm H2O. + F/C 6/19 MRI brain repeat > Improved PRES, progressive infarcts with new infarcts noted involving portions of the posterior right frontal lobe, left periatrial region, right thalamus and right cerebellum with persistent acute/ subacute infarcts involving the left periatrial region, left thalamus and left cerebellum. Tiny amount hemorrhage associated with the left cerebellar infarct not entirely excluded. 6/22 trach, secretions remain an issue 6/23 off vent, ready for tx to sdu - PCCM to  cont to follow as a consult for trach care  6/25 - [facility] assumed care  7/3 - changed to cuffless tracheostomy tube 7/5 passed swallow eval, placed on dysphagia 1 with honey thick.  Subjective: Has some further improvement since yesterday, was able to get to the chair with help of 2 nurses. Patient appears very deconditioned, very then and has extreme muscle wasting. Seen by PCCM today and agrees that he needs to de-cannulated.  Assessment/Plan:  PRES - severe hypertensive encephalopathy Patient presented to the hospital with blood pressure over 250/150 and severe hypertensive encephalopathy. Blood pressure is controlled, continue current medications. He remains on mittens for restraints since 2 days ago, this is discontinued. Blood pressure improved, appears to be reasonable.  Essential hypertension Blood pressure was significantly high at the time of admission.  Blood pressure controlled at present time. Continue labetalol.  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 Continue PT/OT.  VDRF due to altered mental status - S/P tracheostomy Resolved but patient still has tracheostomy. Trach care per PCCM. He was changed to cuffless trach 7/4.  Ordered Bactrim by pulmonology due to yellowish secretions, suggesting tracheitis. To be de-cannulated today.  Right basilar VAP versus aspiration pneumonia (Klebsiella and Enterobacter) Has completed a course of antibiotics therapy.  Nonoliguric acute kidney injury with chronic stage V kidney disease Baseline creatinine approximately 3.8 - creatinine appears to have stabilized   Hypernatremia Sodium has normalized with free water administration   Severe Protein calorie malnutrition in context of chronic illness Was on tube feeding, patient pulled to feeding several times. Currently on dysphagia 1 diet, has good appetite, tracheostomy to be pulled today. Hopefully his diet will be advanced after the  decannulation. RD to recommend diet.  Anemia of acute illness plus anemia of chronic kidney disease Hemoglobin  did trend down, but stable now. There was apparently some oozing of blood from his tracheostomy site. No other overt losses noted. Continue to trend for now and transfuse if it drops below 7.   Mild hyperglycemia No prior history of diabetes - resolved  Severe physical debilitation PT/OT recommended LTAC versus SNF.  CKD stage IV At baseline.   DVT prophylaxis: SCDs Code Status: FULL Family Communication: no family present at bedside. Is unclear if any family is available. Disposition Plan: He will likely need SNF.  Consultants: Neurology  NS PCCM  Antibiotics: On Bactrim per pulmonology  Objective: Blood pressure 147/76, pulse 70, temperature 98.1 F (36.7 C), temperature source Oral, resp. rate 20, height 5\' 6"  (1.676 m), weight 46.3 kg (102 lb 1.2 oz), SpO2 100 %.  Intake/Output Summary (Last 24 hours) at 03/24/15 1254 Last data filed at 03/24/15 0940  Gross per 24 hour  Intake    240 ml  Output   1200 ml  Net   -960 ml   Exam: General:  Alert. In no distress Lungs: Diminished air entry at the bases without any crackles or wheezing.  Neck:  Trach in place. Yellowish drainage is noted. Cardiovascular: Regular rate and rhythm without murmur gallop rub Abdomen: Nontender, nondistended, soft, bowel sounds positive, no rebound, no ascites, no appreciable mass Extremities: No significant cyanosis, clubbing, edema bilateral lower extremities Moving all his extremities. Alert.   Data Reviewed: Basic Metabolic Panel:  Recent Labs Lab 03/21/15 0425 03/23/15 0506 03/24/15 0300  NA 139 142 141  K 4.9 5.1 5.5*  CL 106 108 108  CO2 24 25 23   GLUCOSE 108* 85 73  BUN 66* 70* 73*  CREATININE 3.77* 4.43* 4.59*  CALCIUM 8.6* 8.7* 8.6*   CBC:  Recent Labs Lab 03/19/15 0455 03/21/15 0425 03/23/15 0506 03/24/15 0300  WBC 9.0 12.0* 10.9* 9.0  HGB 7.7*  7.8* 7.5* 7.5*  HCT 24.8* 24.5* 24.3* 23.8*  MCV 88.6 88.8 89.3 90.8  PLT 312 329 333 352    Liver Function Tests: No results for input(s): AST, ALT, ALKPHOS, BILITOT, PROT, ALBUMIN in the last 168 hours. Coags: No results for input(s): INR in the last 168 hours.  Invalid input(s): PT  CBG:  Recent Labs Lab 03/23/15 1124 03/23/15 1618 03/23/15 2204 03/24/15 0645 03/24/15 1214  GLUCAP 204* 132* 102* 86 124*    Scheduled Meds:  Scheduled Meds: . amLODipine  10 mg Per Tube Daily  . labetalol  200 mg Per Tube BID  . sulfamethoxazole-trimethoprim  10 mL Oral Q12H      Clint LippsELMAHI,Sondi Desch A , MD  [phone]  [facility] Office  [phone] Pager - Text Page per Amion as per below:  On-Call/Text Page:      [email or web address]      password [email or web address]  If 7PM-7AM, please contact night-coverage [email or web address] Password [email or web address] 03/24/2015, 12:54 PM   LOS: 26 days

## 2015-03-24 NOTE — Trach Care Team (Signed)
Trach Care Progression Note   Patient Details Name: Lee Swanson R Abbruzzese MRN: [id number] DOB: 08-Nov-1950 Today's Date: 03/24/2015   Tracheostomy Assessment    Tracheostomy Shiley 4 mm Uncuffed (Active)  Status Secured 03/24/2015 12:45 PM  Site Assessment Clean;Dry 03/24/2015 12:45 PM  Site Care Cleansed 03/23/2015  8:00 PM  Inner Cannula Care Cleansed/dried 03/23/2015  4:00 PM  Ties Assessment Secure 03/24/2015 12:45 PM  Cuff pressure (cm) 0 cm 03/24/2015 12:45 PM  Trach Changed Yes 03/21/2015  3:00 PM  Emergency Equipment at bedside Yes 03/24/2015 12:45 PM     Care Needs     Respiratory Therapy O2 Device: Tracheostomy Collar FiO2 (%): 28 % SpO2: 100 % Education:  (not needed at this time) Follow up recommendations:  (will follow as needed ? discharge plans) Respiratory barriers to progression:  (pt remains on ATC )    Speech Language Pathology  SLP chart review complete: Patient not ready for SLP services Patient may use Passy-Muir Speech Valve: During all waking hours (remove during sleep) PMSV Supervision: Intermittent MD: Please consider changing trach tube to : Smaller size, Cuffless Follow up Recommendations: Skilled Nursing facility SLP barriers to progression: Secretions   Physical Therapy PT Recommendation/Assessment: Patient needs continued PT services Follow Up Recommendations: LTACH (vs SNF) PT equipment: Wheelchair (measurements PT), Wheelchair cushion (measurements PT), Rolling walker with 5" wheels    Occupational Therapy      Nutritional Patient's Current Diet: Thickened liquids Tube Feeding: Jevity 1.2 Cal Tube Feeding Frequency: Continuous Tube Feeding Strength: Full strength SLP Diet Recommendations: Dysphagia 1 (Puree) (honey-thick liquids)    Case Management/Social Work      Theatre managerrovider Trach Care Team/Provider Recommendations Trach Care Team Members Present-  Cherylin MylarLauren Doyle, RT, Shon BatonJenna Holloman, SW,  BrunswickBonnie DeBlois, SLP    None at present. Continues on trach  collar.           Arius Harnois, Silva BandyDebra Anita (scribe for team) 03/24/2015, 2:37 PM

## 2015-03-25 DIAGNOSIS — K2270 Barrett's esophagus without dysplasia: Secondary | ICD-10-CM

## 2015-03-25 LAB — GLUCOSE, CAPILLARY
GLUCOSE-CAPILLARY: 122 mg/dL — AB (ref 65–99)
Glucose-Capillary: 100 mg/dL — ABNORMAL HIGH (ref 65–99)
Glucose-Capillary: 90 mg/dL (ref 65–99)

## 2015-03-25 MED ORDER — ADULT MULTIVITAMIN W/MINERALS CH: 1.0000 | ORAL_TABLET | Freq: Every day | ORAL | Status: AC

## 2015-03-25 NOTE — Discharge Summary (Signed)
Physician Discharge Summary  Lee Swanson WUJ:[id number] DOB: [birth year] DOA: 02/26/2015  PCP: No primary care provider on file.  Admit date: 02/26/2015 Discharge date: 03/25/2015  Time spent: 40 minutes  Recommendations for Outpatient Follow-up:  1. Follow-up with nursing home M.D. 2. SLP to follow, currently on dysphagia 1 with honey thick liquids, expected to improve. 3. Follow-up with neurology as outpatient. 4. Follow-up with nephrology as outpatient, patient has CKD stage IV to 5, for long-term evaluation.  Discharge Diagnoses:  Active Problems:   Hypertensive emergency   AKI (acute kidney injury)   CKD (chronic kidney disease) stage 4, GFR 15-29 ml/min   Thrombocytopenia   Cocaine abuse   LVH (left ventricular hypertrophy)   Weight loss   Tobacco abuse   Protein-calorie malnutrition, severe   Barrett's esophagus   Suicidal ideation   Respiratory failure   SAH (subarachnoid hemorrhage)   Acute respiratory failure with hypoxemia   SDH (subdural hematoma)   Acute respiratory failure, unspecified whether with hypoxia or hypercapnia   PRES (posterior reversible encephalopathy syndrome)   Pneumonia, organism unspecified   Acute respiratory failure   Embolic cerebral infarction   Stroke with cerebral ischemia   VAP (ventilator-associated pneumonia)   Tracheostomy status   Essential hypertension   Malignant hypertension   Tracheostomy dependent   Ventilator associated pneumonia   Aspiration pneumonia due to food (regurgitated)   Acute on chronic kidney failure   Hypernatremia   Anemia of chronic disease   Dysphagia   Atelectasis   Bruit   Encounter for feeding tube placement   Discharge Condition: Stable  Diet recommendation: Dysphagia 1 (puree);Honey-thick liquid  Filed Weights   03/23/15 0500 03/24/15 0549 03/25/15 0459  Weight: 47.6 kg (104 lb 15 oz) 46.3 kg (102 lb 1.2 oz) 44.9 kg (98 lb 15.8 oz)    History of present illness:  64 M with hx of  substance abuse, medical non compliance, HTN, CKD, and suicidalideation who was found down for unknown time, breathing, extremely hypertensive 250/155, and was transported to [facility] ED. CT noted small SDH, started on nipride, intubated. NS consulted and MRI ordered. He was cocaine + on admit. PCCM admitted to ICU.   Hospital Course:   This is prolonged and rather complicated hospital stay, this is summarize the hospital stay.  PRES - severe hypertensive encephalopathy Patient presented to the hospital with blood pressure over 250/150 and severe hypertensive encephalopathy. Blood pressure is controlled, continue current medications. He remains on mittens for restraints since 2 days ago, this is discontinued. Blood pressure improved, appears to be reasonable.  Acute CVA MRI finding was consistent with posterior reversible encephalopathy syndrome. Has also small SAH (resolved) and multiple infarcts. MRI also showed infarcts involving the posterior right frontal lobe, left periatrial region, right thalamus and right cerebellum. Patient seen by neurology and recommended TEE and CT angiogram, both were not done. TEE was not done because of technical difficulties, likely secondary to esophageal stricture from Barrett's esophagus. CT angiogram of the neck was not done because of renal function. Discharge on aspirin. Neurology follow-up as outpatient.  Essential hypertension Blood pressure was significantly high at the time of admission.  Blood pressure controlled at present time. Continue labetalol.  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 Continue PT/OT.  VDRF due to altered mental status - S/P tracheostomy Resolved but patient still has tracheostomy. Trach care per PCCM. He was changed to cuffless trach 7/4.  Ordered Bactrim by pulmonology due to yellowish secretions, suggesting  tracheitis. To be de-cannulated today.  Right basilar VAP versus  aspiration pneumonia (Klebsiella and Enterobacter) Has completed a course of antibiotics therapy.  Nonoliguric acute kidney injury with chronic stage V kidney disease Baseline creatinine approximately 3.8 - creatinine appears to have stabilized   Hypernatremia Sodium has normalized with free water administration   Severe Protein calorie malnutrition in context of chronic illness Was on tube feeding, patient pulled to feeding several times. Currently on dysphagia 1 diet, has good appetite, tracheostomy  pulled on 7/7. Hopefully his diet will be advanced after the decannulation, to be followed by SLP in the nursing home.  Anemia of acute illness plus anemia of chronic kidney disease Hemoglobin did trend down, but stable now. There was apparently some oozing of blood from his tracheostomy site. No other overt losses noted. Continue to trend for now and transfuse if it drops below 7.   Dysphagia Likely secondary to stroke and prolonged intubation, patient also has had tracheostomy which was pulled out on 7/7. Patient is on dysphagia 1 with honey thick liquids. To be followed by SLP for evaluation for advancement of diet.  Severe physical debilitation PT/OT recommended LTAC versus SNF.  CKD stage IV Creatinine appears to be at baseline, needs referral to nephrology as outpatient for follow-up.  Barrett's esophagus Restarted back on twice a day Protonix.   Significant Events: 6/11 Intubated in ED 6/11 CT head: Changes suggestive of a small subdural hematoma along the left lateral midbrain 6/11 MRI brain: Profound edema throughout the brainstem, also with involvement of the cerebellum and posterior aspects of the cerebral hemispheres, quite likely to represent PRES with predominant brainstem involvement. The differential diagnosis includes toxic demyelination, but that is felt less likely 6/12 renal US: Echogenic renal parenchyma bilaterally, compatible with medical renal disease. No  hydronephrosis 6/12 Neurology consultation: PRES vs toxic leukoencephalopathy  6/14 More responsive. Fever, copious purulent secretions. Resp culture and empiric abx initiated. Precedex initiated 6/15 Tolerates PS 5-10 cm H2O. Poor mentation and copious ET secretions prohibited extubation 6/16 Failed extubation almost immediately due to poor handling of oropharyngeal secretions 6/17 Tolerates PS 5-10 cm H2O. + F/C 6/19 MRI brain repeat > Improved PRES, progressive infarcts with new infarcts noted involving portions of the posterior right frontal lobe, left periatrial region, right thalamus and right cerebellum with persistent acute/ subacute infarcts involving the left periatrial region, left thalamus and left cerebellum. Tiny amount hemorrhage associated with the left cerebellar infarct not entirely excluded. 6/22 trach, secretions remain an issue 6/23 off vent, ready for tx to sdu - PCCM to cont to follow as a consult for trach care  6/25 - [facility] assumed care  7/3 - changed to cuffless tracheostomy tube 7/5 passed swallow eval, placed on dysphagia 1 with honey thick. 7/7 tracheostomy tube removed by pulmonology. The patient is on room air afterwards.  Procedures:  Intubation and extubation, please see the significant event log.  Consultations:  Patient was under PCCM transfer [facility].  Neurology.  Neurosurgery.  PMR consult  Discharge Exam: Filed Vitals:   03/25/15 0937  BP: 146/86  Pulse: 111  Temp: 97.3 F (36.3 C)  Resp: 20   General: Alert and awake, oriented x3, very thin African-American male HEENT: anicteric sclera, pupils reactive to light and accommodation, EOMI CVS: S1-S2 clear, no murmur rubs or gallops Chest: clear to auscultation bilaterally, no wheezing, rales or rhonchi Abdomen: soft nontender, nondistended, normal bowel sounds, no organomegaly Extremities: no cyanosis, clubbing or edema noted bilaterally Neuro: Cranial nerves  II-XII intact, no focal  neurological deficits  Discharge Instructions   Discharge Instructions    Diet - low sodium heart healthy    Complete by:  As directed      Increase activity slowly    Complete by:  As directed           Current Discharge Medication List    START taking these medications   Details  Multiple Vitamin (MULTIVITAMIN WITH MINERALS) TABS tablet Take 1 tablet by mouth daily.      CONTINUE these medications which have NOT CHANGED   Details  aspirin EC 81 MG EC tablet Take 1 tablet (81 mg total) by mouth daily. Qty: 30 tablet, Refills: 3    pantoprazole (PROTONIX) 40 MG tablet Take 1 tablet (40 mg total) by mouth 2 (two) times daily. Qty: 30 tablet, Refills: 3    sevelamer carbonate (RENVELA) 800 MG tablet Take 1 tablet (800 mg total) by mouth 3 (three) times daily with meals. Qty: 90 tablet, Refills: 3    verapamil (CALAN) 120 MG tablet Take 1 tablet (120 mg total) by mouth every 8 (eight) hours. Qty: 90 tablet, Refills: 3    Vitamin D, Ergocalciferol, (DRISDOL) 50000 UNITS CAPS capsule Take 1 capsule (50,000 Units total) by mouth every 7 (seven) days. TAKE ONE PILL EVERY WEDNESDAY Qty: 7 capsule, Refills: 0      STOP taking these medications     carvedilol (COREG) 6.25 MG tablet        No Known Allergies    The results of significant diagnostics from this hospitalization (including imaging, microbiology, ancillary and laboratory) are listed below for reference.    Significant Diagnostic Studies: Dg Abd 1 View  03/20/2015   CLINICAL DATA:  Nasogastric tube placement.  Initial encounter.  EXAM: ABDOMEN - 1 VIEW  COMPARISON:  Abdominal radiograph performed 03/16/2015  FINDINGS: The patient's enteric tube is seen ending overlying the body of the stomach.  The visualized bowel gas pattern is grossly unremarkable, with scattered stool and air noted in the colon, and scattered air-filled loops of small bowel. There is no evidence of bowel dilatation to suggest obstruction. No  free intra-abdominal air is seen, though evaluation for free air is limited on a single supine view. Minimal residual contrast is seen within colonic diverticula and at the sigmoid colon.  No acute osseous abnormalities are identified. The visualized lung bases are grossly clear.  IMPRESSION: Enteric tube seen ending overlying the body of the stomach.   Electronically Signed   By: Roanna Raider M.D.   On: 03/20/2015 23:12   Ct Head Wo Contrast  02/26/2015   CLINICAL DATA:  Found unresponsive, initial encounter  EXAM: CT HEAD WITHOUT CONTRAST  TECHNIQUE: Contiguous axial images were obtained from the base of the skull through the vertex without intravenous contrast.  COMPARISON:  02/08/2015  FINDINGS: Ventricles are mildly prominent but stable in appearance. The bony calvarium is intact. A areas of chronic white matter ischemic change are noted. There is a linear area of increased attenuation along the lateral aspect of the midbrain on the left which was not present on the prior exam and may represent a small subdural hematoma.  IMPRESSION: Changes suggestive of a small subdural hematoma along the left lateral midbrain. This was not present on the prior exam. Short-term followup is recommended to assess for stability/resolution.  Chronic ischemic changes as described.   Electronically Signed   By: Alcide Clever M.D.   On: 02/26/2015 06:59  Mr Angiogram Head Wo Contrast  02/26/2015   CLINICAL DATA:  Intracranial hemorrhage suggested on CT scan yesterday. Found unresponsive.  EXAM: MRI HEAD WITHOUT CONTRAST  MRA HEAD WITHOUT CONTRAST  TECHNIQUE: Multiplanar, multiecho pulse sequences of the brain and surrounding structures were obtained without intravenous contrast. Angiographic images of the head were obtained using MRA technique without contrast.  COMPARISON:  CT 02/26/2015 and 02/08/2015.  FINDINGS: MRI HEAD FINDINGS  This patient has and a background pattern of chronic small vessel disease affecting the  cerebral hemispheric white matter with old lacunar infarctions in the deep white matter, thalami and basal ganglia. There are micro hemorrhages present associated with many of the old small vessel infarctions seen scattered within the cerebellum and the cerebral hemispheric white matter. There is a small amount of hemorrhage in the perimesencephalic cistern on the left consistent with venous subarachnoid bleeding. There may be a tiny amount of blood dependent in the occipital horn of the left lateral ventricle.  There is profound edema throughout the brainstem also with involvement of the cerebellum and the posterior aspects of the cerebral hemispheres. 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 Differential diagnosis includes toxic demyelination. No evidence of obstructive hydrocephalus. No sign of neoplastic mass lesion. No extra-axial collection. No pituitary mass. No inflammatory sinus disease.  MRA HEAD FINDINGS  Both internal carotid arteries are widely patent through the siphon region. The anterior and middle cerebral vessels are patent without proximal stenosis, aneurysm or vascular malformation. There is artifactual signal loss.  Both vertebral arteries are patent with the right vertebral artery being dominant and supplying the basilar. The left vertebral artery is a small vessel that terminates in PICA. No basilar stenosis. Major posterior circulation branch vessels are patent. Fetal origin of the right PCA.  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 The differential diagnosis does include toxic demyelination, but that is felt less likely.  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  Acute left perimesencephalic subarachnoid hemorrhage, which is usually the result of venous bleeding rather than aneurysmal bleeding.  Negative intracranial MR angiography of the large and medium size vessels.   Electronically Signed   By: Paulina Fusi M.D.   On: 02/26/2015 11:16   Mr Brain Wo Contrast  03/07/2015   CLINICAL DATA:  64 year old hypertensive male with chronic kidney disease and thrombocytopenia with recent abnormal MR. Found unresponsive. Episode of severe hypertension. Subsequent encounter.  EXAM: MRI HEAD WITHOUT CONTRAST  TECHNIQUE: Multiplanar, multiecho pulse sequences of the brain and surrounding structures were obtained without intravenous contrast.  COMPARISON:  02/26/2015 MR.  FINDINGS: Exam is motion degraded.  Decrease in degree of edema throughout the brainstem, cerebellum and basal ganglia. The fact that there is a transient component of white matter changes suggests that the edema may be reflective of posterior reversible encephalopathy syndrome. There is however, evidence of persistent significant white matter type changes, some of which appears to reflect white matter disease (most likely from small vessel disease or possibly vasculitis in this patient with history of substance abuse) in addition to changes of posterior reversible encephalopathy syndrome.  Progressive infarcts with new infarcts noted involving portions of the posterior right frontal lobe, left periatrial region, right thalamus and right cerebellum with persistent acute/ subacute infarcts involving the left periatrial region,  left thalamus and left cerebellum. Tiny amount hemorrhage associated with the left cerebellar infarct not entirely excluded.  Subarachnoid hemorrhage previous noted left perimesencephalic region has cleared. Tiny amount of deep dependent interventricular blood without change.  Remote posterior right corona radiata infarct. Remote small basal ganglia infarcts. Remote small right paracentral  pontine infarct.  Scattered punctate blood breakdown products more notable involving the cerebellum suggestive of result of hemorrhagic ischemia.  Global atrophy. Ventricular prominence felt to be related to atrophy rather than hydrocephalus.  No obvious intracranial mass seen separate from above described findings.  Small left vertebral artery unchanged ending in a posterior inferior cerebellar artery distribution. Major intracranial vascular structures are patent.  C3-4 bulge with mild cord flattening.  Small pituitary gland without expansion of the sella. Slightly prominent peri optic spaces exophthalmos. Pineal region unremarkable and cervical medullary junction within normal limits.  IMPRESSION: Exam is motion degraded.  Interval partial clearing of changes which I suspect were related to posterior reversible encephalopathy syndrome as detailed above. Follow-up interval stabilization may be considered.  Progressive infarcts with new infarcts noted involving portions of the posterior right frontal lobe, left periatrial region, right thalamus and right cerebellum with persistent acute/ subacute infarcts involving the left periatrial region, left thalamus and left cerebellum. Tiny amount hemorrhage associated with the left cerebellar infarct not entirely excluded.  Subarachnoid hemorrhage previous noted left perimesencephalic region has cleared. Tiny amount of deep dependent interventricular blood without change.  Remote posterior right corona radiata infarct. Remote small basal ganglia infarcts. Remote small right paracentral pontine infarct.  Scattered punctate blood breakdown products more notable involving the cerebellum suggestive of result of hemorrhagic ischemia.  Global atrophy. Ventricular prominence felt to be related to atrophy rather than hydrocephalus.  C3-4 bulge with mild cord flattening.   Electronically Signed   By: Lacy Duverney M.D.   On: 03/07/2015 13:00   Mr Brain Wo Contrast  02/26/2015    CLINICAL DATA:  Intracranial hemorrhage suggested on CT scan yesterday. Found unresponsive.  EXAM: MRI HEAD WITHOUT CONTRAST  MRA HEAD WITHOUT CONTRAST  TECHNIQUE: Multiplanar, multiecho pulse sequences of the brain and surrounding structures were obtained without intravenous contrast. Angiographic images of the head were obtained using MRA technique without contrast.  COMPARISON:  CT 02/26/2015 and 02/08/2015.  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 There are micro hemorrhages present associated with many of the old small vessel infarctions seen scattered within the cerebellum and the cerebral hemispheric white matter. There is a small amount of hemorrhage in the perimesencephalic cistern on the left consistent with venous subarachnoid bleeding. There may be a tiny amount of blood dependent in the occipital horn of the left lateral ventricle.  There is profound edema throughout the brainstem also with involvement of the cerebellum and the posterior aspects of the cerebral hemispheres. 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 Differential diagnosis includes toxic demyelination. No evidence of obstructive hydrocephalus. No sign of neoplastic mass lesion. No extra-axial collection. No pituitary mass. No inflammatory sinus disease.  MRA HEAD FINDINGS  Both internal carotid arteries are widely patent through the siphon region. The anterior and middle cerebral vessels are patent without proximal stenosis, aneurysm or vascular malformation. There is artifactual signal loss.  Both vertebral arteries are patent with the right vertebral artery  being dominant and supplying the basilar. The left vertebral artery is a small vessel that terminates in PICA. No  basilar stenosis. Major posterior circulation branch vessels are patent. Fetal origin of the right PCA.  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 The differential diagnosis does include toxic demyelination, but that is felt less likely.  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 Acute left perimesencephalic subarachnoid hemorrhage, which is usually the result of venous bleeding rather than aneurysmal bleeding.  Negative intracranial MR angiography of the large and medium size vessels.   Electronically Signed   By: Paulina FusiMark  Shogry M.D.   On: 02/26/2015 11:16   [place] Renal  02/27/2015   CLINICAL DATA:  64 year old male with history of renal failure.  EXAM: RENAL / URINARY TRACT ULTRASOUND COMPLETE  COMPARISON:  Renal ultrasound 01/24/2015.  FINDINGS: Right Kidney:  Length: 8.6 cm. Echogenicity diffusely increased. No mass or hydronephrosis visualized.  Left Kidney:  Length: 9.4 cm. Echogenicity diffusely increased. 3.1 x 3.0 x 3.4 cm anechoic lesion with increased through transmission in the upper pole, compatible with a simple cyst. No hydronephrosis visualized.  Bladder:  Completely decompressed with a Foley balloon catheter in place.  Other: Small amount of ascites adjacent to the liver incidentally noted. Small amount of amorphous non shadowing echogenic material in the gallbladder, compatible with biliary sludge.  IMPRESSION: 1. Echogenic renal parenchyma bilaterally, compatible with medical renal disease. 2. No hydronephrosis to suggest obstructive uropathy. 3. Trace volume of ascites. 4. Biliary sludge in the gallbladder.   Electronically Signed   By: Trudie Reedaniel  Entrikin M.D.   On: 02/27/2015 15:24   Dg Chest Port 1 View  03/17/2015   CLINICAL DATA:  Respiratory  failure, chronic renal insufficiency  EXAM: PORTABLE CHEST - 1 VIEW  COMPARISON:  Portable chest x-ray of March 14, 2015  FINDINGS: The lungs are reasonably well inflated. There is no focal infiltrate. The infrahilar lung markings remain mildly prominent bilaterally. A tracheostomy appliance is in place with the tip lying at the level of the inferior margin clavicular heads. The feeding tube tip projects below the inferior margin of the image. The cardiac silhouette is top-normal in size. The pulmonary vascularity is normal. The mediastinum is normal in width. The bony thorax is unremarkable.  IMPRESSION: Minimal stable infrahilar interstitial prominence may reflect subsegmental atelectasis. There has been marked improvement since the June 22nd study with no significant change since yesterday's study.   Electronically Signed   By: David  SwazilandJordan M.D.   On: 03/17/2015 08:00   Dg Chest Port 1 View  03/14/2015   CLINICAL DATA:  Follow-up of atelectasis, history of acute and chronic renal insufficiency, respiratory failure and hypoxia, subdural hematoma.  EXAM: PORTABLE CHEST - 1 VIEW  COMPARISON:  Portable chest x-ray of March 09, 2015  FINDINGS: The lungs are adequately inflated. Previously demonstrated pleural effusions are no longer evident. There is no pneumothorax or alveolar infiltrate. The heart and pulmonary vascularity are normal. The endotracheal tube tip lies approximately 1 cm below the inferior margin of the clavicular heads. The bony structures are unremarkable.  IMPRESSION: Improved appearance of both lungs. No acute cardiopulmonary abnormality is observed.   Electronically Signed   By: David  [place] M.D.   On: 03/14/2015 07:40   Dg Chest Port 1 View  03/09/2015   CLINICAL DATA:  Tracheostomy placement  EXAM: PORTABLE CHEST - 1 VIEW  COMPARISON:  March 07, 2015  FINDINGS: Tracheostomy tube tip is 4.3 cm above the carina. Port-A-Cath tip is in the superior vena cava. Nasogastric tube is no longer  present. No pneumothorax.  There is persistent consolidation in the left lower lobe. There are bilateral effusions. Heart is enlarged with pulmonary vascularity within normal limits.  IMPRESSION: Tube and catheter positions as described without pneumothorax. Persistent bilateral pleural effusions. Persistent airspace consolidation left lower lobe. Stable cardiomegaly. Suspect a degree of underlying congestive heart failure.   Electronically Signed   By: Bretta Bang III M.D.   On: 03/09/2015 14:50   Dg Chest Port 1 View  03/07/2015   CLINICAL DATA:  Acute respiratory failure.  Hypertension.  EXAM: PORTABLE CHEST - 1 VIEW  COMPARISON:  03/06/2015.  FINDINGS: Endotracheal to, feeding tube, right PICC line in stable position. Mediastinum hilar structures are stable. Stable cardiomegaly. Bibasilar atelectasis and/or infiltrates. No pneumothorax.  IMPRESSION: 1. Lines and tubes in stable position. 2. Stable cardiomegaly. 3. Bibasilar atelectasis and/or infiltrates unchanged.   Electronically Signed   By: Maisie Fus  Register   On: 03/07/2015 07:37   Dg Chest Port 1 View  03/06/2015   CLINICAL DATA:  Acute respiratory failure  EXAM: PORTABLE CHEST - 1 VIEW  COMPARISON:  03/05/2015  FINDINGS: Endotracheal tube in good position. NG tube enters the stomach. Right subclavian catheter tip in the SVC. No pneumothorax  Improved lung volume. Improvement in bibasilar airspace disease compared with the prior study. Small pleural effusions. Cardiac enlargement with improvement in vascular congestion.  IMPRESSION: Improved lung volume and improved aeration in the lung bases. Decreased vascular congestion. Support lines in good position.   Electronically Signed   By: Marlan Palau M.D.   On: 03/06/2015 08:42   Dg Chest Port 1 View  03/05/2015   CLINICAL DATA:  Acute onset of respiratory failure. Initial encounter.  EXAM: PORTABLE CHEST - 1 VIEW  COMPARISON:  Chest radiograph performed 03/03/2015  FINDINGS: The patient's  endotracheal tube is seen ending 3 cm above the carina. An enteric tube is noted extending below the diaphragm. A right subclavian line is noted ending about the cavoatrial junction.  The lungs are mildly hypoexpanded. Patchy bibasilar airspace opacities may reflect multifocal pneumonia or pulmonary edema, perhaps slightly worsened from the prior study. No definite pleural effusion or pneumothorax is seen.  The cardiomediastinal silhouette is mildly enlarged. Previously suggested pneumomediastinum is less readily apparent, and may be artifactual in nature. Air overlying the left side of the mediastinum appears to reflect bowel. No acute osseous abnormalities are identified.  IMPRESSION: 1. Endotracheal tube seen ending 3 cm above the carina. 2. Lungs mildly hypoexpanded. Patchy bibasilar airspace opacities may reflect multifocal pneumonia or pulmonary edema, perhaps slightly worsened from the prior study. 3. Mild cardiomegaly. Previously suggested pneumomediastinum is less readily characterized and may have been artifactual in nature.   Electronically Signed   By: Roanna Raider M.D.   On: 03/05/2015 07:29   Dg Chest Port 1 View  03/03/2015   CLINICAL DATA:  64 year old male status post re-intubation.  EXAM: PORTABLE CHEST - 1 VIEW  COMPARISON:  Chest x-ray 03/03/2015.  FINDINGS: An endotracheal tube is in place with tip 1.6 cm above the carina. There is a right-sided subclavian central venous catheter with tip terminating in the superior cavoatrial junction. A nasogastric tube is seen extending into the stomach, however, the tip of the nasogastric tube extends below the lower  margin of the image. Lung volumes remain low. There are bibasilar opacities that may reflect areas of atelectasis and/or consolidation, likely with superimposed small bilateral pleural effusions. Unusual lucencies in the medial aspect of both sides of the thorax, concerning for potential pneumomediastinum. Heart size is mildly enlarged.  Upper mediastinal contours are within normal limits. Atherosclerosis in the thoracic aorta.  IMPRESSION: 1. Support apparatus, as above. Withdrawal of endotracheal tube approximately 3 4 cm for more optimal placement is recommended. 2. Interval development of unusual lucencies projecting over the lower mediastinum. This could be artifactual, however, the possibility of pneumomediastinum should be considered, and close attention at time of repeat radiographs falling repositioning of endotracheal tube is recommended to ensure resolution of this finding. 3. Persistent bibasilar areas of atelectasis and/or consolidation with small bilateral pleural effusions. These results will be called to the ordering clinician or representative by the Radiologist Assistant, and communication documented in the PACS or zVision Dashboard.   Electronically Signed   By: Trudie Reed M.D.   On: 03/03/2015 16:04   Dg Chest Port 1 View  03/03/2015   CLINICAL DATA:  Respiratory failure.  EXAM: PORTABLE CHEST - 1 VIEW  COMPARISON:  03/02/2015.  FINDINGS: Endotracheal tube, NG tube, right subclavian line in stable position. Stable cardiomegaly. Persistent bilateral pulmonary infiltrates/edema and small pleural effusions. No interim change. No pneumothorax.  IMPRESSION: 1. Lines and tubes in stable position. 2. Stable cardiomegaly. 3. Persistent bibasilar atelectasis and/or infiltrates/edema. Persistent small bilateral pleural effusions.   Electronically Signed   By: Maisie Fus  Register   On: 03/03/2015 07:18   Dg Chest Port 1 View  03/02/2015   CLINICAL DATA:  Respiratory failure  EXAM: PORTABLE CHEST - 1 VIEW  COMPARISON:  03/01/2015  FINDINGS: The endotracheal tube tip remains between the clavicular heads and carina. Right subclavian central line, tip at the distal SVC. The orogastric tube enters the stomach at least.  Slightly improved left basilar aeration, although there is still extensive bibasilar airspace opacity with a hazy  appearance. No evidence of edema in the upper lungs. No air leak.  Stable cardiomegaly.  IMPRESSION: 1. Extensive bibasilar atelectasis or pneumonia with probable small effusions. Slight improvement in left base aeration since yesterday. 2. Stable positioning of tubes and central line.   Electronically Signed   By: Marnee Spring M.D.   On: 03/02/2015 07:09   Dg Chest Port 1 View  03/01/2015   CLINICAL DATA:  64 year old male with respiratory failure and shortness of Breath. Initial encounter.  EXAM: PORTABLE CHEST - 1 VIEW  COMPARISON:  02/28/2015 and earlier.  FINDINGS: Portable AP semi upright view at 0448 hours. Stable endotracheal tube tip between the level of the clavicles and carina. Enteric tube courses to the abdomen as before, tip not included. Stable right subclavian central line.  Increased veiling opacity at both lung bases. Stable cardiac size and mediastinal contours. No pneumothorax or acute pulmonary edema. No other confluent pulmonary opacity.  IMPRESSION: 1.  Stable lines and tubes. 2. Increased bilateral pleural effusions and lower lobe collapse/consolidation.   Electronically Signed   By: Odessa Fleming M.D.   On: 03/01/2015 07:21   Dg Chest Port 1 View  02/28/2015   CLINICAL DATA:  Renal failure.  EXAM: PORTABLE CHEST - 1 VIEW  COMPARISON:  02/27/2015.  FINDINGS: Endotracheal tube terminates 4.7 cm above the carina. Nasogastric tube is followed into the stomach. Right subclavian central line tip projects over the low SVC. There is mild patchy airspace opacification in  the left lower lobe, similar to 02/27/2015. Small right pleural effusion.  IMPRESSION: 1. Left lower lobe patchy airspace opacification may be due to atelectasis or pneumonia. 2. Small right pleural effusion.   Electronically Signed   By: Leanna Battles M.D.   On: 02/28/2015 07:18   Dg Chest Port 1 View  02/27/2015   CLINICAL DATA:  Check endotracheal tube position  EXAM: PORTABLE CHEST - 1 VIEW  COMPARISON:  02/26/2015   FINDINGS: Endotracheal tube is again noted in satisfactory position. Nasogastric catheter is seen within the stomach new from the prior exam. Right central venous line is again noted and stable. Cardiac shadow is within normal limits. New patchy left basilar infiltrate is seen.  IMPRESSION: New left basilar infiltrate.  Tubes and lines as described.   Electronically Signed   By: Alcide Clever M.D.   On: 02/27/2015 08:35   Dg Chest Port 1 View  02/26/2015   CLINICAL DATA:  Central line and endotracheal tube placements.  EXAM: PORTABLE CHEST - 1 VIEW  COMPARISON:  01/26/2015  FINDINGS: Endotracheal tube has been placed and terminates approximately 4 cm above the carina. Right subclavian central venous catheter has been placed with tip overlying the lower SVC. Cardiomediastinal silhouette is unchanged and within normal limits. The lungs are well inflated and clear. No pleural effusion or pneumothorax is identified. No acute osseous abnormality is seen.  IMPRESSION: 1. Support devices as above. 2. Clear lungs.   Electronically Signed   By: Sebastian Ache   On: 02/26/2015 09:07   Dg Abd Portable 1v  03/16/2015   CLINICAL DATA:  Feeding tube placement.  EXAM: PORTABLE ABDOMEN - 1 VIEW  COMPARISON:  03/16/2015.  FINDINGS: Feeding tube terminates in the gastric antrum. Retained oral contrast is seen in the transverse colon.  IMPRESSION: Feeding tube terminates in the gastric antrum.   Electronically Signed   By: Leanna Battles M.D.   On: 03/16/2015 14:17   Dg Abd Portable 1v  03/16/2015   CLINICAL DATA:  Evaluate feeding tube placement.  EXAM: PORTABLE ABDOMEN - 1 VIEW  COMPARISON:  03/15/2015  FINDINGS: Feeding tube is coiled in the region of the stomach. There is oral contrast in the transverse colon. Nonobstructive bowel gas pattern.  IMPRESSION: Feeding tube is coiled in the stomach.   Electronically Signed   By: Richarda Overlie M.D.   On: 03/16/2015 10:09   Dg Abd Portable 1v  03/16/2015   CLINICAL DATA:   Nasogastric tube placement  EXAM: PORTABLE ABDOMEN - 1 VIEW  COMPARISON:  None.  FINDINGS: The nasogastric tube tip is in the region of the proximal gastric body. The side port is at the expected location of the EG junction. The tube should be advanced 5-10 cm for optimal placement.  IMPRESSION: NG tube just reaches the stomach, with side-port in the region of the EG junction. Recommend advancement 5-10 cm. These results were called by telephone at the time of interpretation on 03/16/2015 at 3:14 am to nurse Herbert Seta, who verbally acknowledged these results.   Electronically Signed   By: Ellery Plunk M.D.   On: 03/16/2015 03:14   Dg Abd Portable 1v  03/09/2015   CLINICAL DATA:  Nasogastric tube placement  EXAM: PORTABLE ABDOMEN - 1 VIEW  COMPARISON:  March 04, 2015  FINDINGS: Nasogastric tube tip and side port are in the stomach. There is no bowel dilatation or air-fluid level suggesting obstruction. No free air. There is consolidation in the left lower lobe.  IMPRESSION:  Nasogastric tube tip and side port in stomach. Bowel gas pattern unremarkable. Left lower lobe airspace consolidation.   Electronically Signed   By: Bretta Bang III M.D.   On: 03/09/2015 17:23   Dg Abd Portable 1v  03/04/2015   CLINICAL DATA:  Nasogastric tube placement  EXAM: PORTABLE ABDOMEN - 1 VIEW  COMPARISON:  Portable exam 1257 hours without priors for comparison.  FINDINGS: Tip of nasogastric tube projects over distal gastric antrum.  Normal bowel gas pattern.  No bowel dilatation or bowel wall thickening.  Bones unremarkable.  IMPRESSION: Tip of nasogastric tube projects over distal gastric antrum.   Electronically Signed   By: Ulyses Southward M.D.   On: 03/04/2015 13:43   Dg Swallowing Func-speech Pathology  03/22/2015    Objective Swallowing Evaluation:    Patient Details  Name: DARA CAMARGO MRN: [id number] Date of Birth: 1951-03-10  Today's Date: 03/22/2015 Time: SLP Start Time (ACUTE ONLY): 1330-SLP Stop Time (ACUTE ONLY):  1400 SLP Time Calculation (min) (ACUTE ONLY): 30 min  Past Medical History:  Past Medical History  Diagnosis Date  . Chronic kidney disease     Stage IV  . Hypokalemia   . Thrombocytopenia   . Hypertension 01/2015.    Hypertensive emergency.  . Hepatitis C antibody test positive 01/25/2015    HIV testing negative on the same date.  . Elevated troponin I level 01/2015.    Felt to be secondary to demand ischemia in setting of hypertension and  cocaine use. Cardiac cath not planned given kidney disease  . Vitamin D deficiency 01/2015.  . Barrett's esophagus 02/05/2015    EGD 01/2015 - anticipate repeat EGD 2019   Past Surgical History:  Past Surgical History  Procedure Laterality Date  . Esophagogastroduodenoscopy N/A 01/27/2015    Procedure: ESOPHAGOGASTRODUODENOSCOPY (EGD);  Surgeon: Iva Boop,  MD;  Location: [facility] ENDOSCOPY;  Service: Endoscopy;  Laterality: N/A;   HPI:  Other Pertinent Information: 64 yr old with hx of substance abuse,  medical non compliance, HTN. CRD, suicidal Ideation admitted after being  found down for unknown time, extremely hypertensive 250/155 and intubated.  Recieved trach 6/22, trach collar 6/23. Per MD note pt cocaine + on admit.  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 Repeat MRI progressive infarcts with new infarcts  posterior right frontal, left parietal regin, right thalamus and bilateral  cerebellum.  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 Acute left perimesencephalic  subarachnoid hemorrhage.  No Data Recorded  Assessment / Plan / Recommendation CHL IP CLINICAL IMPRESSIONS 03/22/2015  Therapy Diagnosis Mild pharyngeal phase dysphagia;Mild oral phase  dysphagia  Clinical Impression Pt presents with improved swallow function since MBS   6/28, such that a modified oral diet may be started.  Pt now with #4  cuffless trach; he used PMV during the assessment.  He had difficulty  masticating mechanical solids, which led to piecemeal bolusing and  unchewed bolus particles residing in pharynx.  Purees were tolerated with  excellent clearance through pharyngeal space.  Thin liquids were  immediately aspirated after spilling prematurely into the pyriforms;  nectar-thick liquids were also intermittently aspirated.  Aspiration did  not consistently elicit a cough response.  Honey thick liquids were   consumed over multiple trials with no penetration/aspiration.  Recommend  initiating a dysphagia  1 diet with honey-thick liquids; crush meds in  puree; D/C NG.  PEG no longer indicated.  SLP will follow for  toleration/diet advancement.       CHL IP TREATMENT RECOMMENDATION 03/22/2015  Treatment Recommendations Therapy as outlined in treatment plan below     CHL IP DIET RECOMMENDATION 03/22/2015  SLP Diet Recommendations Dysphagia 1 (Puree)  Liquid Administration via (None)  Medication Administration Crushed with puree  Compensations Slow rate;Small sips/bites  Postural Changes and/or Swallow Maneuvers (None)     CHL IP OTHER RECOMMENDATIONS 03/22/2015  Recommended Consults (None)  Oral Care Recommendations Oral care BID  Other Recommendations Order thickener from pharmacy     CHL IP FOLLOW UP RECOMMENDATIONS 03/22/2015  Follow up Recommendations Skilled Nursing facility     [facility] IP FREQUENCY AND DURATION 03/11/2015  Speech Therapy Frequency (ACUTE ONLY) min 2x/week  Treatment Duration (None)         SLP Swallow Goals No flowsheet data found.  No flowsheet data found.              No flowsheet data found.  No flowsheet data found.         Blenda Mounts Laurice 03/22/2015, 2:16 PM    Dg Swallowing Func-speech Pathology  03/15/2015    Objective Swallowing Evaluation:    Patient Details  Name: Lee Swanson MRN: [id number] Date of Birth: January 08, 1951  Today's Date:  03/15/2015 Time: SLP Start Time (ACUTE ONLY): 1330-SLP Stop Time (ACUTE ONLY): 1545 SLP Time Calculation (min) (ACUTE ONLY): 135 min  Past Medical History:  Past Medical History  Diagnosis Date  . Chronic kidney disease     Stage IV  . Hypokalemia   . Thrombocytopenia   . Hypertension 01/2015.    Hypertensive emergency.  . Hepatitis C antibody test positive 01/25/2015    HIV testing negative on the same date.  . Elevated troponin I level 01/2015.    Felt to be secondary to demand ischemia in setting of hypertension and  cocaine use. Cardiac cath not planned given kidney disease  . Vitamin D deficiency 01/2015.  . Barrett's esophagus 02/05/2015    EGD 01/2015 - anticipate repeat EGD 2019   Past Surgical History:  Past Surgical History  Procedure Laterality Date  . Esophagogastroduodenoscopy N/A 01/27/2015    Procedure: ESOPHAGOGASTRODUODENOSCOPY (EGD);  Surgeon: Iva Boop,  MD;  Location: [facility] ENDOSCOPY;  Service: Endoscopy;  Laterality: N/A;   HPI:  Other Pertinent Information: 64 yr old with hx of substance abuse,  medical non compliance, HTN. CRD, suicidal Ideation admitted after being  found down for unknown time, extremely hypertensive 250/155 and intubated.  Recieved trach 6/22, trach collar 6/23. Per MD note pt cocaine + on admit.  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 Repeat MRI progressive infarcts with new infarcts  posterior right frontal, left parietal regin, right thalamus and bilateral  cerebellum.  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 Acute left perimesencephalic  subarachnoid hemorrhage.  No Data Recorded  Assessment / Plan / Recommendation CHL IP CLINICAL IMPRESSIONS 03/15/2015  Therapy Diagnosis (None)  Clinical Impression Pt demosntrates  oropharyngeal dysphagia with severe  weakness and decreased negative pressure. The oropharyngeal mechanism is  atypical and pt is unable to protect his airway due to lack of redirection  of air to the  upper airway for cough or sensation. Pt is not safe for PO  intake at the time. He will need a smaller trach prior to diet tolerance.  SLP will continue to follow.       CHL IP TREATMENT RECOMMENDATION 03/15/2015  Treatment Recommendations F/U MBS in --- days (Comment)     CHL IP DIET RECOMMENDATION 03/15/2015  SLP Diet Recommendations NPO  Liquid Administration via (None)  Medication Administration (None)  Compensations (None)  Postural Changes and/or Swallow Maneuvers (None)     CHL IP OTHER RECOMMENDATIONS 03/15/2015  Recommended Consults (None)  Oral Care Recommendations Oral care QID  Other Recommendations (None)     CHL IP FOLLOW UP RECOMMENDATIONS 03/14/2015  Follow up Recommendations Skilled Nursing facility     [facility] IP FREQUENCY AND DURATION 03/11/2015  Speech Therapy Frequency (ACUTE ONLY) min 2x/week  Treatment Duration (None)     Pertinent Vitals/Pain NA    SLP Swallow Goals No flowsheet data found.  No flowsheet data found.    CHL IP REASON FOR REFERRAL 03/15/2015  Reason for Referral Objectively evaluate swallowing function     CHL IP ORAL PHASE 03/15/2015  Lips (None)  Tongue (None)  Mucous membranes (None)  Nutritional status (None)  Other (None)  Oxygen therapy (None)  Oral Phase Impaired  Oral - Pudding Teaspoon (None)  Oral - Pudding Cup (None)  Oral - Honey Teaspoon (None)  Oral - Honey Cup (None)  Oral - Honey Syringe (None)  Oral - Nectar Teaspoon (None)  Oral - Nectar Cup (None)  Oral - Nectar Straw (None)  Oral - Nectar Syringe (None)  Oral - Ice Chips (None)  Oral - Thin Teaspoon (None)  Oral - Thin Cup (None)  Oral - Thin Straw (None)  Oral - Thin Syringe (None)  Oral - Puree (None)  Oral - Mechanical Soft (None)  Oral - Regular (None)  Oral - Multi-consistency (None)  Oral - Pill (None)  Oral Phase -  Comment (None)      CHL IP PHARYNGEAL PHASE 03/15/2015  Pharyngeal Phase Impaired  Pharyngeal - Pudding Teaspoon (None)  Penetration/Aspiration details (pudding teaspoon) (None)  Pharyngeal - Pudding Cup (None)  Penetration/Aspiration details (pudding cup) (None)  Pharyngeal - Honey Teaspoon (None)  Penetration/Aspiration details (honey teaspoon) (None)  Pharyngeal - Honey Cup (None)  Penetration/Aspiration details (honey cup) (None)  Pharyngeal - Honey Syringe (None)  Penetration/Aspiration details (honey syringe) (None)  Pharyngeal - Nectar Teaspoon (None)  Penetration/Aspiration details (nectar teaspoon) (None)  Pharyngeal - Nectar Cup (None)  Penetration/Aspiration details (nectar cup) (None)  Pharyngeal - Nectar Straw (None)  Penetration/Aspiration details (nectar straw) (None)  Pharyngeal - Nectar Syringe (None)  Penetration/Aspiration details (nectar syringe) (None)  Pharyngeal - Ice Chips (None)  Penetration/Aspiration details (ice chips) (None)  Pharyngeal - Thin Teaspoon (None)  Penetration/Aspiration details (thin teaspoon) (None)  Pharyngeal - Thin Cup (None)  Penetration/Aspiration details (thin cup) (None)  Pharyngeal - Thin Straw (None)  Penetration/Aspiration details (thin straw) (None)  Pharyngeal - Thin Syringe (None)  Penetration/Aspiration details (thin syringe') (None)  Pharyngeal - Puree (None)  Penetration/Aspiration details (puree) (None)  Pharyngeal - Mechanical Soft (None)  Penetration/Aspiration details (mechanical soft) (None)  Pharyngeal - Regular (None)  Penetration/Aspiration details (regular) (None)  Pharyngeal - Multi-consistency (None)  Penetration/Aspiration details (multi-consistency) (None)  Pharyngeal - Pill (None)  Penetration/Aspiration details (pill) (None)  Pharyngeal Comment (None)      No flowsheet data found.  No flowsheet data found.        [employer],  MA CCC-SLP [phone]  [place], [name] 03/15/2015, 3:21 PM     Microbiology: Recent Results (from the past  240 hour(s))  Culture, respiratory (NON-Expectorated)     Status: None   Collection Time: 03/16/15 11:15 AM  Result Value Ref Range Status   Specimen Description TRACHEAL ASPIRATE  Final   Special Requests Normal  Final   Gram Stain   Final    FEW WBC PRESENT, PREDOMINANTLY MONONUCLEAR RARE SQUAMOUS EPITHELIAL CELLS PRESENT FEW GRAM NEGATIVE RODS Performed at [facility]    Culture   Final    MODERATE ACALIGENES FAECALIS Performed at [facility]    Report Status 03/21/2015 FINAL  Final   Organism ID, Bacteria ACALIGENES FAECALIS  Final      Susceptibility   Acaligenes faecalis - MIC*    CEFEPIME <=1 SENSITIVE Sensitive     CEFTAZIDIME 4 SENSITIVE Sensitive     CIPROFLOXACIN 1 SENSITIVE Sensitive     GENTAMICIN 4 SENSITIVE Sensitive     IMIPENEM 0.5 SENSITIVE Sensitive     PIP/TAZO <=4 SENSITIVE Sensitive     TOBRAMYCIN 4 SENSITIVE Sensitive     * MODERATE ACALIGENES FAECALIS     Labs: Basic Metabolic Panel:  Recent Labs Lab 03/21/15 0425 03/23/15 0506 03/24/15 0300  NA 139 142 141  K 4.9 5.1 5.5*  CL 106 108 108  CO2 24 25 23   GLUCOSE 108* 85 73  BUN 66* 70* 73*  CREATININE 3.77* 4.43* 4.59*  CALCIUM 8.6* 8.7* 8.6*   Liver Function Tests: No results for input(s): AST, ALT, ALKPHOS, BILITOT, PROT, ALBUMIN in the last 168 hours. No results for input(s): LIPASE, AMYLASE in the last 168 hours. No results for input(s): AMMONIA in the last 168 hours. CBC:  Recent Labs Lab 03/19/15 0455 03/21/15 0425 03/23/15 0506 03/24/15 0300  WBC 9.0 12.0* 10.9* 9.0  HGB 7.7* 7.8* 7.5* 7.5*  HCT 24.8* 24.5* 24.3* 23.8*  MCV 88.6 88.8 89.3 90.8  PLT 312 329 333 352   Cardiac Enzymes: No results for input(s): CKTOTAL, CKMB, CKMBINDEX, TROPONINI in the last 168 hours. BNP: BNP (last 3 results) No results for input(s): BNP in the last 8760 hours.  ProBNP (last 3 results) No results for input(s): PROBNP in the last 8760 hours.  CBG:  Recent  Labs Lab 03/24/15 1214 03/24/15 1823 03/24/15 2215 03/25/15 0015 03/25/15 0421  GLUCAP 124* 117* 97 90 100*       Signed:  Meha Vidrine A  [facility] 03/25/2015, 10:27 AM

## 2015-03-25 NOTE — Progress Notes (Signed)
PT Cancellation Note  Patient Details Name: Armandina Stammerreston R Ebey MRN: [id number] DOB: [birth year]   Cancelled Treatment:    Reason Eval/Treat Not Completed: Patient declined, no reason specified Refuses to work with therapy. Apparently upset that he cannot drink milk without thickener. Educated on importance of being out of bed and working with therapy to improve functional independence and reduce risk of secondary complications. Still upset about milk.  Berton MountBarbour, Joseff Luckman S 03/25/2015, 10:49 AM Charlsie MerlesLogan Secor Armstrong [place], [place] [phone]

## 2015-03-25 NOTE — Progress Notes (Signed)
Patient is being d/c to a nursing home. Report called to the receiving nurse Toribio HarbourSteve Daniel.

## 2015-03-25 NOTE — Clinical Social Work Placement (Signed)
   CLINICAL SOCIAL WORK PLACEMENT  NOTE  Date:  03/25/2015  Patient Details  Name: Lee Swanson MRN: [id number] Date of Birth: 1951/06/21  Clinical Social Work is seeking post-discharge placement for this patient at the Skilled  Nursing Facility level of care (*CSW will initial, date and re-position this form in  chart as items are completed):  Yes   Patient/family provided with [place] Clinical Social Work Department's list of facilities offering this level of care within the geographic area requested by the patient (or if unable, by the patient's family).  Yes   Patient/family informed of their freedom to choose among providers that offer the needed level of care, that participate in Medicare, Medicaid or managed care program needed by the patient, have an available bed and are willing to accept the patient.  Yes   Patient/family informed of [facility]'s ownership interest in [facility] and [facility], as well as of the fact that they are under no obligation to receive care at these facilities.  PASRR submitted to EDS on 03/25/15     PASRR number received on 03/25/15     Existing PASRR number confirmed on       FL2 transmitted to all facilities in geographic area requested by pt/family on       FL2 transmitted to all facilities within larger geographic area on 03/18/15     Patient informed that his/her managed care company has contracts with or will negotiate with certain facilities, including the following:        Yes   Patient/family informed of bed offers received.  [facility]     Physician recommends and patient chooses bed at      Patient to be [facility] on 03/25/15.  Patient to be transferred to facility by [facility]      Patient family notified on 03/25/15 of transfer.  Name of family member notified:  Pt decline to notify family      PHYSICIAN       Additional Comment:     _______________________________________________ Gwynne EdingerBibbs, Lin Hackmann, LCSW 03/25/2015, 11:02 AM

## 2015-03-28 LAB — TYPE AND SCREEN
ABO/RH(D): B POS
Antibody Screen: NEGATIVE
Unit division: 0

## 2015-04-04 ENCOUNTER — Inpatient Hospital Stay (HOSPITAL_COMMUNITY): Payer: Medicare Other | Admitting: Registered Nurse

## 2015-04-04 ENCOUNTER — Encounter (HOSPITAL_COMMUNITY): Payer: Self-pay | Admitting: Internal Medicine

## 2015-04-04 ENCOUNTER — Encounter (HOSPITAL_COMMUNITY)
Admission: AD | Disposition: A | Discharge status: Skilled Nursing Facility | Payer: Self-pay | Source: Other Acute Inpatient Hospital | Attending: Internal Medicine

## 2015-04-04 ENCOUNTER — Inpatient Hospital Stay (HOSPITAL_COMMUNITY)
Admission: AD | Admit: 2015-04-04 | Discharge: 2015-04-07 | DRG: 668 | Disposition: A | Discharge status: Skilled Nursing Facility | Payer: Medicare Other | Source: Other Acute Inpatient Hospital | Attending: Family Medicine | Admitting: Family Medicine

## 2015-04-04 DIAGNOSIS — I129 Hypertensive chronic kidney disease with stage 1 through stage 4 chronic kidney disease, or unspecified chronic kidney disease: Secondary | ICD-10-CM | POA: Diagnosis present

## 2015-04-04 DIAGNOSIS — N401 Enlarged prostate with lower urinary tract symptoms: Secondary | ICD-10-CM | POA: Diagnosis present

## 2015-04-04 DIAGNOSIS — F4323 Adjustment disorder with mixed anxiety and depressed mood: Secondary | ICD-10-CM | POA: Diagnosis present

## 2015-04-04 DIAGNOSIS — K2270 Barrett's esophagus without dysplasia: Secondary | ICD-10-CM | POA: Diagnosis present

## 2015-04-04 DIAGNOSIS — N184 Chronic kidney disease, stage 4 (severe): Secondary | ICD-10-CM | POA: Diagnosis present

## 2015-04-04 DIAGNOSIS — R310 Gross hematuria: Principal | ICD-10-CM | POA: Diagnosis present

## 2015-04-04 DIAGNOSIS — Z638 Other specified problems related to primary support group: Secondary | ICD-10-CM | POA: Diagnosis not present

## 2015-04-04 DIAGNOSIS — Z9119 Patient's noncompliance with other medical treatment and regimen: Secondary | ICD-10-CM | POA: Diagnosis present

## 2015-04-04 DIAGNOSIS — N320 Bladder-neck obstruction: Secondary | ICD-10-CM | POA: Diagnosis present

## 2015-04-04 DIAGNOSIS — Z681 Body mass index (BMI) 19 or less, adult: Secondary | ICD-10-CM | POA: Diagnosis not present

## 2015-04-04 DIAGNOSIS — F1410 Cocaine abuse, uncomplicated: Secondary | ICD-10-CM | POA: Diagnosis present

## 2015-04-04 DIAGNOSIS — Z79899 Other long term (current) drug therapy: Secondary | ICD-10-CM

## 2015-04-04 DIAGNOSIS — I517 Cardiomegaly: Secondary | ICD-10-CM | POA: Diagnosis present

## 2015-04-04 DIAGNOSIS — F17210 Nicotine dependence, cigarettes, uncomplicated: Secondary | ICD-10-CM | POA: Diagnosis present

## 2015-04-04 DIAGNOSIS — D500 Iron deficiency anemia secondary to blood loss (chronic): Secondary | ICD-10-CM | POA: Diagnosis present

## 2015-04-04 DIAGNOSIS — N400 Enlarged prostate without lower urinary tract symptoms: Secondary | ICD-10-CM | POA: Diagnosis present

## 2015-04-04 DIAGNOSIS — I6783 Posterior reversible encephalopathy syndrome: Secondary | ICD-10-CM | POA: Diagnosis present

## 2015-04-04 DIAGNOSIS — L8990 Pressure ulcer of unspecified site, unspecified stage: Secondary | ICD-10-CM | POA: Diagnosis present

## 2015-04-04 DIAGNOSIS — R338 Other retention of urine: Secondary | ICD-10-CM | POA: Diagnosis present

## 2015-04-04 DIAGNOSIS — B1920 Unspecified viral hepatitis C without hepatic coma: Secondary | ICD-10-CM | POA: Diagnosis present

## 2015-04-04 DIAGNOSIS — Z7982 Long term (current) use of aspirin: Secondary | ICD-10-CM | POA: Diagnosis not present

## 2015-04-04 DIAGNOSIS — Z8673 Personal history of transient ischemic attack (TIA), and cerebral infarction without residual deficits: Secondary | ICD-10-CM | POA: Diagnosis not present

## 2015-04-04 DIAGNOSIS — I252 Old myocardial infarction: Secondary | ICD-10-CM

## 2015-04-04 DIAGNOSIS — I10 Essential (primary) hypertension: Secondary | ICD-10-CM | POA: Diagnosis present

## 2015-04-04 DIAGNOSIS — D638 Anemia in other chronic diseases classified elsewhere: Secondary | ICD-10-CM | POA: Diagnosis present

## 2015-04-04 DIAGNOSIS — E43 Unspecified severe protein-calorie malnutrition: Secondary | ICD-10-CM | POA: Diagnosis present

## 2015-04-04 HISTORY — DX: Cerebral infarction due to embolism of unspecified cerebral artery: I63.40

## 2015-04-04 HISTORY — DX: Cocaine abuse, uncomplicated: F14.10

## 2015-04-04 HISTORY — DX: Cerebral infarction, unspecified: I63.9

## 2015-04-04 HISTORY — DX: Nontraumatic subarachnoid hemorrhage, unspecified: I60.9

## 2015-04-04 HISTORY — DX: Benign prostatic hyperplasia without lower urinary tract symptoms: N40.0

## 2015-04-04 HISTORY — DX: Posterior reversible encephalopathy syndrome: I67.83

## 2015-04-04 HISTORY — PX: CYSTOSCOPY: SHX5120

## 2015-04-04 HISTORY — DX: Other fecal abnormalities: R19.5

## 2015-04-04 HISTORY — DX: Anemia in other chronic diseases classified elsewhere: D63.8

## 2015-04-04 HISTORY — DX: Dysphagia, unspecified: R13.10

## 2015-04-04 LAB — PREPARE RBC (CROSSMATCH)

## 2015-04-04 LAB — ABO/RH: ABO/RH(D): B POS

## 2015-04-04 LAB — GLUCOSE, CAPILLARY: Glucose-Capillary: 120 mg/dL — ABNORMAL HIGH (ref 65–99)

## 2015-04-04 LAB — CBC
HCT: 18 % — ABNORMAL LOW (ref 39.0–52.0)
Hemoglobin: 5.9 g/dL — CL (ref 13.0–17.0)
MCH: 29.1 pg (ref 26.0–34.0)
MCHC: 32.2 g/dL (ref 30.0–36.0)
MCV: 90.5 fL (ref 78.0–100.0)
Platelets: 305 10*3/uL (ref 150–400)
RBC: 1.99 MIL/uL — ABNORMAL LOW (ref 4.22–5.81)
RDW: 16.6 % — AB (ref 11.5–15.5)
WBC: 38.5 10*3/uL — ABNORMAL HIGH (ref 4.0–10.5)

## 2015-04-04 LAB — SURGICAL PCR SCREEN
MRSA, PCR: NEGATIVE
STAPHYLOCOCCUS AUREUS: NEGATIVE

## 2015-04-04 SURGERY — CYSTOSCOPY
Anesthesia: General | Site: Bladder

## 2015-04-04 MED ORDER — 0.9 % SODIUM CHLORIDE (POUR BTL) OPTIME
TOPICAL | Status: DC | PRN
  Administered 2015-04-04: 1000 mL

## 2015-04-04 MED ORDER — ONDANSETRON HCL 4 MG/2ML IJ SOLN
4.0000 mg | Freq: Four times a day (QID) | INTRAMUSCULAR | Status: DC | PRN
Start: 2015-04-04 — End: 2015-04-07

## 2015-04-04 MED ORDER — CEFAZOLIN SODIUM-DEXTROSE 2-3 GM-% IV SOLR
2.0000 g | INTRAVENOUS | Status: AC
  Administered 2015-04-04: 2 g via INTRAVENOUS

## 2015-04-04 MED ORDER — PROMETHAZINE HCL 25 MG/ML IJ SOLN: 6.2500 mg | INTRAMUSCULAR | Status: DC | PRN

## 2015-04-04 MED ORDER — CEFAZOLIN SODIUM-DEXTROSE 2-3 GM-% IV SOLR
INTRAVENOUS | Status: AC
  Filled 2015-04-04: qty 50

## 2015-04-04 MED ORDER — MEPERIDINE HCL 50 MG/ML IJ SOLN: 6.2500 mg | INTRAMUSCULAR | Status: DC | PRN

## 2015-04-04 MED ORDER — PROPOFOL 10 MG/ML IV BOLUS
INTRAVENOUS | Status: AC
  Filled 2015-04-04: qty 20

## 2015-04-04 MED ORDER — PROPOFOL 10 MG/ML IV BOLUS
INTRAVENOUS | Status: DC | PRN
  Administered 2015-04-04: 60 mg via INTRAVENOUS

## 2015-04-04 MED ORDER — SUGAMMADEX SODIUM 200 MG/2ML IV SOLN
INTRAVENOUS | Status: DC | PRN
  Administered 2015-04-04: 100 mg via INTRAVENOUS

## 2015-04-04 MED ORDER — ONDANSETRON HCL 4 MG/2ML IJ SOLN
INTRAMUSCULAR | Status: DC | PRN
  Administered 2015-04-04: 4 mg via INTRAVENOUS

## 2015-04-04 MED ORDER — ROCURONIUM BROMIDE 100 MG/10ML IV SOLN
INTRAVENOUS | Status: AC
  Filled 2015-04-04: qty 1

## 2015-04-04 MED ORDER — ONDANSETRON HCL 4 MG/2ML IJ SOLN
INTRAMUSCULAR | Status: AC
  Filled 2015-04-04: qty 2

## 2015-04-04 MED ORDER — SEVELAMER CARBONATE 800 MG PO TABS
800.0000 mg | ORAL_TABLET | Freq: Three times a day (TID) | ORAL | Status: DC
  Administered 2015-04-05 – 2015-04-06 (×4): 800 mg via ORAL
  Filled 2015-04-04 (×9): qty 1

## 2015-04-04 MED ORDER — FENTANYL CITRATE (PF) 100 MCG/2ML IJ SOLN
INTRAMUSCULAR | Status: DC | PRN
  Administered 2015-04-04: 50 ug via INTRAVENOUS

## 2015-04-04 MED ORDER — ONDANSETRON HCL 4 MG PO TABS: 4.0000 mg | ORAL_TABLET | Freq: Four times a day (QID) | ORAL | Status: DC | PRN

## 2015-04-04 MED ORDER — LIDOCAINE HCL 2 % EX GEL
CUTANEOUS | Status: AC
  Filled 2015-04-04: qty 10

## 2015-04-04 MED ORDER — VITAMIN D (ERGOCALCIFEROL) 1.25 MG (50000 UNIT) PO CAPS
50000.0000 [IU] | ORAL_CAPSULE | ORAL | Status: DC
  Administered 2015-04-06: 50000 [IU] via ORAL
  Filled 2015-04-04: qty 1

## 2015-04-04 MED ORDER — SODIUM CHLORIDE 0.9 % IV SOLN
Freq: Once | INTRAVENOUS | Status: AC
  Administered 2015-04-04: 18:00:00 via INTRAVENOUS

## 2015-04-04 MED ORDER — SODIUM CHLORIDE 0.9 % IR SOLN
Status: DC | PRN
  Administered 2015-04-04: 6000 mL

## 2015-04-04 MED ORDER — SODIUM CHLORIDE 0.9 % IV SOLN
INTRAVENOUS | Status: DC | PRN
  Administered 2015-04-04: 19:00:00 via INTRAVENOUS

## 2015-04-04 MED ORDER — PANTOPRAZOLE SODIUM 40 MG PO TBEC
40.0000 mg | DELAYED_RELEASE_TABLET | Freq: Two times a day (BID) | ORAL | Status: DC
  Administered 2015-04-05 – 2015-04-06 (×3): 40 mg via ORAL
  Filled 2015-04-04 (×7): qty 1

## 2015-04-04 MED ORDER — FENTANYL CITRATE (PF) 100 MCG/2ML IJ SOLN: 25.0000 ug | INTRAMUSCULAR | Status: DC | PRN

## 2015-04-04 MED ORDER — SUGAMMADEX SODIUM 200 MG/2ML IV SOLN
INTRAVENOUS | Status: AC
  Filled 2015-04-04: qty 2

## 2015-04-04 MED ORDER — FENTANYL CITRATE (PF) 100 MCG/2ML IJ SOLN
INTRAMUSCULAR | Status: AC
  Filled 2015-04-04: qty 2

## 2015-04-04 MED ORDER — LACTATED RINGERS IV SOLN: INTRAVENOUS | Status: DC | PRN

## 2015-04-04 MED ORDER — LIDOCAINE HCL (CARDIAC) 20 MG/ML IV SOLN
INTRAVENOUS | Status: AC
  Filled 2015-04-04: qty 5

## 2015-04-04 MED ORDER — ROCURONIUM BROMIDE 100 MG/10ML IV SOLN
INTRAVENOUS | Status: DC | PRN
  Administered 2015-04-04: 20 mg via INTRAVENOUS

## 2015-04-04 MED ORDER — MIDAZOLAM HCL 2 MG/2ML IJ SOLN
INTRAMUSCULAR | Status: AC
  Filled 2015-04-04: qty 2

## 2015-04-04 MED ORDER — ETOMIDATE 2 MG/ML IV SOLN
INTRAVENOUS | Status: DC | PRN
  Administered 2015-04-04: 6 mg via INTRAVENOUS

## 2015-04-04 MED ORDER — LIDOCAINE HCL (CARDIAC) 20 MG/ML IV SOLN
INTRAVENOUS | Status: DC | PRN
  Administered 2015-04-04: 60 mg via INTRAVENOUS

## 2015-04-04 MED ORDER — VERAPAMIL HCL 120 MG PO TABS
120.0000 mg | ORAL_TABLET | Freq: Three times a day (TID) | ORAL | Status: DC
Start: 2015-04-04 — End: 2015-04-07
  Administered 2015-04-05 – 2015-04-06 (×5): 120 mg via ORAL
  Filled 2015-04-04 (×11): qty 1

## 2015-04-04 MED ORDER — ADULT MULTIVITAMIN W/MINERALS CH
1.0000 | ORAL_TABLET | Freq: Every day | ORAL | Status: DC
  Filled 2015-04-04 (×3): qty 1

## 2015-04-04 SURGICAL SUPPLY — 25 items
BAG URINE DRAINAGE (UROLOGICAL SUPPLIES) ×3 IMPLANT
BAG URO CATCHER STRL LF (DRAPE) ×3 IMPLANT
BLADE SURG 15 STRL LF DISP TIS (BLADE) IMPLANT
BLADE SURG 15 STRL SS (BLADE)
CATH FOLEY 3WAY 30CC 22FR (CATHETERS) ×3 IMPLANT
CATH ROBINSON RED A/P 16FR (CATHETERS) IMPLANT
CATH URET 5FR 28IN OPEN ENDED (CATHETERS) IMPLANT
CLOTH BEACON ORANGE TIMEOUT ST (SAFETY) ×3 IMPLANT
ELECT BIVAP BIPO 22/24 DONUT (ELECTROSURGICAL) ×3
ELECT REM PT RETURN 9FT ADLT (ELECTROSURGICAL) ×3
ELECTRD BIVAP BIPO 22/24 DONUT (ELECTROSURGICAL) ×2 IMPLANT
ELECTRODE REM PT RTRN 9FT ADLT (ELECTROSURGICAL) ×2 IMPLANT
GLOVE SURG SS PI 8.0 STRL IVOR (GLOVE) ×3 IMPLANT
GOWN STRL REUS W/TWL XL LVL3 (GOWN DISPOSABLE) ×3 IMPLANT
HOLDER FOLEY CATH W/STRAP (MISCELLANEOUS) ×3 IMPLANT
IV NS IRRIG 3000ML ARTHROMATIC (IV SOLUTION) ×6 IMPLANT
KIT ASPIRATION TUBING (SET/KITS/TRAYS/PACK) IMPLANT
LOOP CUT BIPOLAR 24F LRG (ELECTROSURGICAL) IMPLANT
MANIFOLD NEPTUNE II (INSTRUMENTS) ×3 IMPLANT
NS IRRIG 1000ML POUR BTL (IV SOLUTION) ×3 IMPLANT
PACK CYSTO (CUSTOM PROCEDURE TRAY) ×3 IMPLANT
SUT ETHILON 3 0 PS 1 (SUTURE) IMPLANT
SYR 30ML LL (SYRINGE) ×3 IMPLANT
SYRINGE IRR TOOMEY STRL 70CC (SYRINGE) ×3 IMPLANT
TUBING CONNECTING 10 (TUBING) ×3 IMPLANT

## 2015-04-04 NOTE — Progress Notes (Signed)
Partial Charting under Quinn AxeRebecca Kiaan Overholser however Devra Doppathy Jablonski, RN was solely responsible for all care in pacu.

## 2015-04-04 NOTE — H&P (Addendum)
[facility] History and Physical  Armandina Stammerreston R Puello ZOX:[id number] DOB: 11/11/1950 DOA: 04/04/2015   PCP: No primary care provider on file.    Chief Complaint: hematuria  HPI: Armandina Stammerreston R Lasecki is a 64 y.o. male who [facility] after a 1 month stay to rehab. He was hospitalized for hypertensive emergency, cocaine use, Press syndrome, Small SAH, multifocal infarcts. Had VDRF- trach removed just prior to discharge.  He presents from the nursing home with gross hematuria starting today. Hb 7.3- was 7,7 when discharged. [facility]. Currently in pain from distended bladder. Poor historian, answering questions only intermittently.    Unable to obtain ROS due to poor cooperation from patient  Past Medical History  Diagnosis Date  . Chronic kidney disease     Stage IV  . Hypokalemia   . Thrombocytopenia   . Hypertension 01/2015.    Hypertensive emergency.  . Hepatitis C antibody test positive 01/25/2015    HIV testing negative on the same date.  . Elevated troponin I level 01/2015.    Felt to be secondary to demand ischemia in setting of hypertension and cocaine use. Cardiac cath not planned given kidney disease  . Vitamin D deficiency 01/2015.  . Barrett's esophagus 02/05/2015    EGD 01/2015 - anticipate repeat EGD 2019  . Stroke with cerebral ischemia   . SAH (subarachnoid hemorrhage) 02/26/2015  . PRES (posterior reversible encephalopathy syndrome)   . Embolic cerebral infarction   . Dysphagia   . Cocaine abuse 01/24/2015  . Barrett's esophagus 02/05/2015    EGD 01/2015 - anticipate repeat EGD 2019   . Anemia of chronic disease   . Enlarged prostate 01/24/2015  . Heme + stool     Past Surgical History  Procedure Laterality Date  . Esophagogastroduodenoscopy N/A 01/27/2015    Procedure: ESOPHAGOGASTRODUODENOSCOPY (EGD);  Surgeon: Iva Booparl E Gessner, MD;  [facility];  Service: Endoscopy;  Laterality: N/A;    Social History:  h/o cocaine abuse- - currently residing in nursing home- not smoking or drinking     No Known Allergies  Family history:  No family history on file. patient will not tell me of any medical problems in his family    Prior to Admission medications   Medication Sig Start Date End Date Taking? Authorizing Provider  aspirin EC 81 MG EC tablet Take 1 tablet (81 mg total) by mouth daily. 01/29/15   Su Hoffarly J Rivet, MD  Multiple Vitamin (MULTIVITAMIN WITH MINERALS) TABS tablet Take 1 tablet by mouth daily. 03/25/15   Clydia LlanoMutaz Elmahi, MD  pantoprazole (PROTONIX) 40 MG tablet Take 1 tablet (40 mg total) by mouth 2 (two) times daily. 01/29/15   Su Hoffarly J Rivet, MD  sevelamer carbonate (RENVELA) 800 MG tablet Take 1 tablet (800 mg total) by mouth 3 (three) times daily with meals. 01/29/15   Carly Arlyce HarmanJ Rivet, MD  verapamil (CALAN) 120 MG tablet Take 1 tablet (120 mg total) by mouth every 8 (eight) hours. 01/29/15   Su Hoffarly J Rivet, MD  Vitamin D, Ergocalciferol, (DRISDOL) 50000 UNITS CAPS capsule Take 1 capsule (50,000 Units total) by mouth every 7 (seven) days. TAKE ONE PILL EVERY [facility] 01/29/15   Su Hoffarly J Rivet, MD     Physical Exam: Filed Vitals:   04/04/15 1734  BP: 139/85  Pulse: 88  Resp: 18  SpO2: 100%     General: AAO x 3, mild distress HEENT: Normocephalic and Atraumatic, Mucous membranes pink  PERRLA; EOM intact; No scleral icterus,                 Nares: Patent, Oropharynx: Clear, Fair Dentition                 Neck: FROM, no cervical lymphadenopathy, thyromegaly, carotid bruit or JVD; has a scar in the center of his neck from recent tracheotomy Breasts: deferred CHEST WALL: No tenderness  CHEST: Normal respiration, clear to auscultation bilaterally  HEART: Regular rate and rhythm; no murmurs rubs or gallops  BACK: No kyphosis or scoliosis; no CVA tenderness  GI: Poor Bowel Sounds, soft, non-tender;tender large mass felt in supapubic area (bladdar) , no organomegaly Rectal Exam:  deferred MSK: No cyanosis, clubbing, or edema Genitalia: not examined  SKIN:  no rash or ulceration  CNS: Alert and Oriented x 4, Nonfocal exam, CN 2-12 intact  Labs on Admission:  Basic Metabolic Panel: Sodium 133, potassium 4.1, glucose 165, BUN 65, creatinine 3.10, bicarbonate 23  Liver Function Tests:  AST 37 ALT 44 alkaline phosphatase 101 total bili 0.5  CBC: WBC count 13.1, hemoglobin 7.8, hematocrit 22.6, platelets 449  UA Specific gravity 1.005 pH 6.0, protein 2+, glucose trace, ketones negative, bilirubin negative, occult blood 3+, urobilinogen less than 2.0, leukocytes trace, nitrites negative, wbc's 30-40, RBCs too numerous to count, bacteria 2+, calcium oxalate 3+  Radiological Exams on Admission: CT abdomen and pelvis-large volume of hemorrhage within the urinary bladder source for bleed is not identified. Market prostate or megaly, negative urinary tract stone  EKG: Independently reviewed. Sinus tach at 138 , LVH  Assessment/Plan Principal Problem:   Hematuria, gross -as been evaluated by urology, Dr. Annabell Howells who feels this is likely secondary to his severely enlarged prostate-prostate measures close to 6-7 cm on CT scan -plans to to take the patient to the OR to evacuate the bladder and place catheter -hold aspirin -UA was done but it does not appear culture was sent-we'll order a culture now-he already received Rocephin in the ER at [place]-I will not resume this unless urology thinks it is needed  Active Problems: Anemia: -Has chronic anemia with hemoglobin of 7.7 when discharged, this was 7.3 when checked this morning-suspect it will likely drop further and therefore we'll transfuse 1 unit packed red cells now and follow hemoglobin every 8 hours -last anemia panel consistent with anemia of chronic disease -need to consider giving him iron and procrit prior to discharge    CKD (chronic kidney disease) stage 4, GFR 15-29 ml/min -Creatinine actually slightly  improved from when he was discharged when it was 4.59    LVH (left ventricular hypertrophy)/   Essential hypertension/ PRESS  last admission -resume verapamil --he states he took his medications today-  Barrett's esophagus -Resume PPI  Dysphagia -disharged on a honey thick liquid diet last time  CVA thought to be embolic -holding aspirin -TEE could not be performed last admission -he is to follow up with neurology as outpatient  Recent tracheotomy  O cane abuse  Consulted: urology  Code Status: full code   Family Communication:  DVT Prophylaxis:SCDs  Time spent: 50 minutes  Currie Dennin, MD [facility]  If 7PM-7AM, please contact night-coverage [email or web address] 04/04/2015, 5:38 PM

## 2015-04-04 NOTE — Anesthesia Procedure Notes (Signed)
Procedure Name: Intubation Date/Time: 04/04/2015 7:23 PM Performed by: Jarvis NewcomerARMISTEAD, Arihana Ambrocio A Pre-anesthesia Checklist: Patient identified, Emergency Drugs available, Suction available, Patient being monitored and Timeout performed Patient Re-evaluated:Patient Re-evaluated prior to inductionOxygen Delivery Method: Circle system utilized Preoxygenation: Pre-oxygenation with 100% oxygen Intubation Type: IV induction Ventilation: Mask ventilation without difficulty Laryngoscope Size: Mac and 4 Grade View: Grade I Tube type: Oral Tube size: 7.5 mm Number of attempts: 1 Airway Equipment and Method: Stylet Placement Confirmation: positive ETCO2,  breath sounds checked- equal and bilateral and ETT inserted through vocal cords under direct vision Secured at: 21 cm Tube secured with: Tape Dental Injury: Teeth and Oropharynx as per pre-operative assessment

## 2015-04-04 NOTE — Progress Notes (Signed)
pacu note  All nurses notes and assessment done by Glennon Kopko rn

## 2015-04-04 NOTE — Consult Note (Signed)
Day of Surgery  Subjective: Lee Swanson is a 64 yo male who was sent [place] [facility] [place] [facility] this morning for hematuria.    A CT was done which shows a very large clot in the bladder with a massive prostate that is 7cm in diameter.   He is a very poor historian but is arousable and reports suprapubic pain.  He is on ASA but no other anticoagulants.   He was discharged 10 days ago after a long an complicated hospitalization for cocaine induced hypertensive strokes.     ROS:  Review of Systems  Unable to perform ROS: mental acuity   No Known Allergies  Past Medical History  Diagnosis Date  . Chronic kidney disease     Stage IV  . Hypokalemia   . Thrombocytopenia   . Hypertension 01/2015.    Hypertensive emergency.  . Hepatitis C antibody test positive 01/25/2015    HIV testing negative on the same date.  . Elevated troponin I level 01/2015.    Felt to be secondary to demand ischemia in setting of hypertension and cocaine use. Cardiac cath not planned given kidney disease  . Vitamin D deficiency 01/2015.  . Barrett's esophagus 02/05/2015    EGD 01/2015 - anticipate repeat EGD 2019  . Stroke with cerebral ischemia   . SAH (subarachnoid hemorrhage) 02/26/2015  . PRES (posterior reversible encephalopathy syndrome)   . Embolic cerebral infarction   . Dysphagia   . Cocaine abuse 01/24/2015  . Barrett's esophagus 02/05/2015    EGD 01/2015 - anticipate repeat EGD 2019   . Anemia of chronic disease   . Enlarged prostate 01/24/2015  . Heme + stool     Past Surgical History  Procedure Laterality Date  . Esophagogastroduodenoscopy N/A 01/27/2015    Procedure: ESOPHAGOGASTRODUODENOSCOPY (EGD);  Surgeon: Iva Booparl E Gessner, MD;  [facility];  Service: Endoscopy;  Laterality: N/A;    History   Social History  . Marital Status: Unknown    Spouse Name: N/A  . Number of Children: N/A  . Years of Education: N/A   Occupational History  . Not on file.    Social History Main Topics  . Smoking status: Current Every Day Smoker -- 0.50 packs/day    Types: Cigarettes  . Smokeless tobacco: Never Used  . Alcohol Use: Yes  . Drug Use: Yes    Special: Cocaine  . Sexual Activity: Not on file   Other Topics Concern  . 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   No family history on file.  Anti-infectives: Anti-infectives    Start     Dose/Rate Route Frequency Ordered Stop   04/04/15 1728  ceFAZolin (ANCEF) IVPB 2 g/50 mL premix     2 g 100 mL/hr over 30 Minutes Intravenous 30 min pre-op 04/04/15 1728        Current Facility-Administered Medications  Medication Dose Route Frequency Provider Last Rate Last Dose  .  0.9 %  sodium chloride infusion   Intravenous Once [name], MD      . ceFAZolin (ANCEF) IVPB 2 g/50 mL premix  2 g Intravenous 30 min Pre-Op [name], MD      . ondansetron [facility]) tablet 4 mg  4 mg Oral Q6H PRN [name], MD       Or  . ondansetron (ZOFRAN) injection 4 mg  4 mg Intravenous Q6H PRN [name], MD      . pantoprazole (PROTONIX) EC tablet 40 mg  40 mg Oral BID [name], MD      . verapamil (CALAN) tablet 120 mg  120 mg Oral 3 times per day [name], MD        Med review not complete by pharmacy.   Objective: Vital signs in last 24 hours: Pulse Rate:  [88] 88 (07/18 1734) Resp:  [18] 18 (07/18 1734) BP: (139)/(85) 139/85 mmHg (07/18 1734) SpO2:  [100 %] 100 % (07/18 1734)  Intake/Output from previous day:   Intake/Output this shift:     Physical Exam  Constitutional:  Thin BM in NAD but  is only A/0 to person.   HENT:  Head: Normocephalic and atraumatic.  Neck: Normal range of motion. Neck supple.  Cardiovascular: Normal rate and regular rhythm.   Pulmonary/Chest: Effort normal and breath sounds normal.  Abdominal: He exhibits mass (suprapubic). There is tenderness.  Musculoskeletal: He exhibits no edema or tenderness.  Neurological:  He is somewhat responsive but doesn't follow commands well.   Skin: Skin is warm and dry.  Psychiatric:  Un able to assess  I will do a rectal in the OR.   Lab Results:  No results for input(s): WBC, HGB, HCT, PLT in the last 72 hours. BMET No results for input(s): NA, K, CL, CO2, GLUCOSE, BUN, CREATININE, CALCIUM in the last 72 hours. PT/INR No results for input(s): LABPROT, INR in the last 72 hours. ABG No results for input(s): PHART, HCO3 in the last 72 hours.  Invalid input(s): PCO2, PO2  Studies/Results: No results found.  I have reviewed his CT films from [place] and his most recent labs from 03/24/15.   Assessment: He has clot retention with a massive prostate which is the probable source of the bleeding.    Plan: I need to take him to the OR to do cystoscopy and clot evacuation.   I was able to review the risks with him including further bleeding, infection, injury to the bladder, need for an SP tube, thrombotic events and anesthetic complications and he is agreeable to proceed.   CC: Dr. Billey Co    LOS: 0 days    Anner Crete 04/04/2015 . [place]

## 2015-04-04 NOTE — Anesthesia Preprocedure Evaluation (Addendum)
Anesthesia Evaluation  Patient identified by MRN, date of birth, ID band Patient confused    Reviewed: Allergy & Precautions, NPO status , Patient's Chart, lab work & pertinent test results  Airway Mallampati: II  TM Distance: >3 FB Neck ROM: Full    Dental no notable dental hx. (+) Poor Dentition   Pulmonary neg pulmonary ROS, Current Smoker,  breath sounds clear to auscultation  Pulmonary exam normal       Cardiovascular hypertension, Pt. on medications + CAD negative cardio ROS Normal cardiovascular examRhythm:Regular Rate:Normal     Neuro/Psych CVA, Residual Symptoms negative neurological ROS  negative psych ROS   GI/Hepatic negative GI ROS, Neg liver ROS, (+)     substance abuse  cocaine use,   Endo/Other  negative endocrine ROS  Renal/GU Renal diseasenegative Renal ROS  negative genitourinary   Musculoskeletal negative musculoskeletal ROS (+)   Abdominal   Peds negative pediatric ROS (+)  Hematology negative hematology ROS (+)   Anesthesia Other Findings   Reproductive/Obstetrics negative OB ROS                           Anesthesia Physical Anesthesia Plan  ASA: III and emergent  Anesthesia Plan: General   Post-op Pain Management:    Induction: Intravenous  Airway Management Planned: Oral ETT  Additional Equipment:   Intra-op Plan:   Post-operative Plan: Extubation in OR  Informed Consent: I have reviewed the patients History and Physical, chart, labs and discussed the procedure including the risks, benefits and alternatives for the proposed anesthesia with the patient or authorized representative who has indicated his/her understanding and acceptance.   Dental advisory given  Plan Discussed with: CRNA  Anesthesia Plan Comments:         Anesthesia Quick Evaluation

## 2015-04-04 NOTE — Anesthesia Postprocedure Evaluation (Signed)
  Anesthesia Post-op Note  Patient: Lee Swanson  Procedure(s) Performed: Procedure(s) (LRB): CYSTOSCOPY WITH CLOT EVACUATION/ FULGERATION OF PROSTATE (N/A)  Patient Location: PACU  Anesthesia Type: General  Level of Consciousness: awake and alert   Airway and Oxygen Therapy: Patient Spontanous Breathing  Post-op Pain: mild  Post-op Assessment: Post-op Vital signs reviewed, Patient's Cardiovascular Status Stable, Respiratory Function Stable, Patent Airway and No signs of Nausea or vomiting  Last Vitals:  Filed Vitals:   04/04/15 2045  BP: 160/101  Pulse: 80  Temp:   Resp: 7    Post-op Vital Signs: stable   Complications: No apparent anesthesia complications

## 2015-04-04 NOTE — Progress Notes (Signed)
[facility], patient alert, noted bloody urine and clots on the bedpad, Stage II pressure ulcer noyed on patient sacrum, no drainage noted. patient C/O of bladder pain, Dr Butler Denmarkizwan and Annabell HowellsWrenn aware, in the room assessing patient, Dr. Annabell HowellsWrenn ordering for patient to go to OR. Getting patient ready for OR, unable to complete admission, report off to night shift nurse to F/U.

## 2015-04-04 NOTE — Transfer of Care (Signed)
Immediate Anesthesia Transfer of Care Note  Patient: Lee Swanson  Procedure(s) Performed: Procedure(s): CYSTOSCOPY WITH CLOT EVACUATION, POSSIBLE FULGERATION OF PROSTATE (N/A)  Patient Location: PACU  Anesthesia Type:General  Level of Consciousness: awake, alert , oriented and patient cooperative  Airway & Oxygen Therapy: Patient Spontanous Breathing and Patient connected to face mask oxygen  Post-op Assessment: Report given to RN, Post -op Vital signs reviewed and stable and Patient moving all extremities  Post vital signs: Reviewed and stable  Last Vitals:  Filed Vitals:   04/04/15 1734  BP: 139/85  Pulse: 88  Temp: 34.1 C  Resp: 18    Complications: No apparent anesthesia complications

## 2015-04-04 NOTE — Progress Notes (Signed)
Unable to get consent from patient, patient's daughter Lee Swanson called to get telephone consent.

## 2015-04-04 NOTE — Brief Op Note (Signed)
04/04/2015  7:58 PM  PATIENT:  Armandina StammerPreston R Franko  64 y.o. male  PRE-OPERATIVE DIAGNOSIS:  clot retention.  POST-OPERATIVE DIAGNOSIS:  same  PROCEDURE:  Procedure(s): CYSTOSCOPY WITH CLOT EVACUATION, POSSIBLE FULGERATION OF PROSTATE (N/A)  SURGEON:  Surgeon(s) and Role:    * Bjorn PippinJohn Zarea Diesing, MD - Primary  PHYSICIAN ASSISTANT:   ASSISTANTS: none   ANESTHESIA:   general  EBL:     BLOOD ADMINISTERED:none  DRAINS: Urinary Catheter (Foley)   LOCAL MEDICATIONS USED:  NONE  SPECIMEN:  No Specimen  DISPOSITION OF SPECIMEN:  N/A  COUNTS:  YES  TOURNIQUET:  * No tourniquets in log *  DICTATION: .Other Dictation: Dictation Number [id number]  PLAN OF CARE: Admit to inpatient   PATIENT DISPOSITION:  PACU - hemodynamically stable.   Delay start of Pharmacological VTE agent (>24hrs) due to surgical blood loss or risk of bleeding: not applicable

## 2015-04-05 ENCOUNTER — Encounter (HOSPITAL_COMMUNITY): Payer: Self-pay | Admitting: Urology

## 2015-04-05 DIAGNOSIS — I517 Cardiomegaly: Secondary | ICD-10-CM

## 2015-04-05 DIAGNOSIS — L8990 Pressure ulcer of unspecified site, unspecified stage: Secondary | ICD-10-CM | POA: Insufficient documentation

## 2015-04-05 LAB — BASIC METABOLIC PANEL
Anion gap: 9 (ref 5–15)
Anion gap: 7 (ref 5–15)
BUN: 59 mg/dL — AB (ref 6–20)
BUN: 61 mg/dL — ABNORMAL HIGH (ref 6–20)
CO2: 19 mmol/L — AB (ref 22–32)
CO2: 21 mmol/L — ABNORMAL LOW (ref 22–32)
CREATININE: 3 mg/dL — AB (ref 0.61–1.24)
CREATININE: 3.01 mg/dL — AB (ref 0.61–1.24)
Calcium: 7.8 mg/dL — ABNORMAL LOW (ref 8.9–10.3)
Calcium: 7.9 mg/dL — ABNORMAL LOW (ref 8.9–10.3)
Chloride: 108 mmol/L (ref 101–111)
Chloride: 109 mmol/L (ref 101–111)
GFR calc Af Amer: 24 mL/min — ABNORMAL LOW (ref >60)
GFR calc Af Amer: 24 mL/min — ABNORMAL LOW (ref >60)
GFR calc non Af Amer: 20 mL/min — ABNORMAL LOW (ref >60)
GFR calc non Af Amer: 20 mL/min — ABNORMAL LOW (ref >60)
Glucose, Bld: 76 mg/dL (ref 65–99)
Glucose, Bld: 65 mg/dL (ref 65–99)
POTASSIUM: 4.7 mmol/L (ref 3.5–5.1)
POTASSIUM: 4.7 mmol/L (ref 3.5–5.1)
SODIUM: 136 mmol/L (ref 135–145)
SODIUM: 137 mmol/L (ref 135–145)

## 2015-04-05 LAB — HEMOGLOBIN AND HEMATOCRIT, BLOOD
HEMATOCRIT: 21.8 % — AB (ref 39.0–52.0)
Hemoglobin: 7.4 g/dL — ABNORMAL LOW (ref 13.0–17.0)

## 2015-04-05 LAB — CBC
HCT: 19.3 % — ABNORMAL LOW (ref 39.0–52.0)
HCT: 23.3 % — ABNORMAL LOW (ref 39.0–52.0)
HEMOGLOBIN: 6.5 g/dL — AB (ref 13.0–17.0)
Hemoglobin: 7.8 g/dL — ABNORMAL LOW (ref 13.0–17.0)
MCH: 29.4 pg (ref 26.0–34.0)
MCH: 30 pg (ref 26.0–34.0)
MCHC: 33.7 g/dL (ref 30.0–36.0)
MCHC: 33.5 g/dL (ref 30.0–36.0)
MCV: 87.3 fL (ref 78.0–100.0)
MCV: 89.6 fL (ref 78.0–100.0)
PLATELETS: 212 10*3/uL (ref 150–400)
Platelets: 196 10*3/uL (ref 150–400)
RBC: 2.21 MIL/uL — AB (ref 4.22–5.81)
RBC: 2.6 MIL/uL — ABNORMAL LOW (ref 4.22–5.81)
RDW: 15.7 % — AB (ref 11.5–15.5)
RDW: 16.8 % — ABNORMAL HIGH (ref 11.5–15.5)
WBC: 26.5 10*3/uL — ABNORMAL HIGH (ref 4.0–10.5)
WBC: 18 10*3/uL — AB (ref 4.0–10.5)

## 2015-04-05 LAB — PREPARE RBC (CROSSMATCH)

## 2015-04-05 LAB — PROTIME-INR
INR: 1.21 (ref 0.00–1.49)
Prothrombin Time: 15.4 seconds — ABNORMAL HIGH (ref 11.6–15.2)

## 2015-04-05 LAB — APTT: APTT: 29 s (ref 24–37)

## 2015-04-05 MED ORDER — SODIUM CHLORIDE 0.9 % IV SOLN
INTRAVENOUS | Status: DC
  Administered 2015-04-05: 01:00:00 via INTRAVENOUS

## 2015-04-05 MED ORDER — DARBEPOETIN ALFA 200 MCG/0.4ML IJ SOSY
200.0000 ug | PREFILLED_SYRINGE | Freq: Once | INTRAMUSCULAR | Status: AC
  Administered 2015-04-05: 200 ug via SUBCUTANEOUS
  Filled 2015-04-05: qty 0.4

## 2015-04-05 MED ORDER — HYDRALAZINE HCL 20 MG/ML IJ SOLN
10.0000 mg | Freq: Four times a day (QID) | INTRAMUSCULAR | Status: DC | PRN
  Administered 2015-04-06: 10 mg via INTRAVENOUS
  Filled 2015-04-05: qty 1

## 2015-04-05 MED ORDER — STARCH (THICKENING) PO POWD
ORAL | Status: DC | PRN
  Filled 2015-04-05: qty 227

## 2015-04-05 MED ORDER — SODIUM CHLORIDE 0.9 % IV SOLN
Freq: Once | INTRAVENOUS | Status: DC
Start: 2015-04-05 — End: 2015-04-07

## 2015-04-05 MED ORDER — ENSURE ENLIVE PO LIQD
237.0000 mL | Freq: Two times a day (BID) | ORAL | Status: DC
  Administered 2015-04-05 – 2015-04-06 (×4): 237 mL via ORAL

## 2015-04-05 MED ORDER — FINASTERIDE 5 MG PO TABS
5.0000 mg | ORAL_TABLET | Freq: Every day | ORAL | Status: DC
  Administered 2015-04-05 – 2015-04-06 (×2): 5 mg via ORAL
  Filled 2015-04-05 (×3): qty 1

## 2015-04-05 NOTE — Progress Notes (Signed)
Notified on call,  Marchelle Folksmanda, that patient is continuously oozing blood and clots around catheter insertion site.  Informed her that CBI was flowing through without any problems currently.  Orders to just keep an eye on bleeding. Will continue to monitor patient.

## 2015-04-05 NOTE — Progress Notes (Signed)
Patient admitted to floor from PACU.  Very sleepy post-op, open eyes to name being called but very limited verbal responses.  Unable to assess mentation. Bleeding and bloody clots noted to foley insertion sight requiring bed linens to be changed.  CBI infusing as well as a unit of blood that was started in PACU.  Telemetry discontinued per most recent admit order.  BP elevated on floor and temp 94.2 rectally.  Notified NP on call.  Orders to continuing monitoring temp. Will keep an close eye on patient.

## 2015-04-05 NOTE — Progress Notes (Signed)
[facility] Progress Note   Lee Swanson:[id number] DOB: December 28, 1950 DOA: 04/04/2015 PCP: No primary care provider on file.  Brief narrative: Lee Swanson is a 64 y.o. male who was discharged from [facility] on 7/8 after a 1 month stay to rehab. He was hospitalized for hypertensive emergency, cocaine use, Press syndrome, Small SAH, multifocal infarcts. Had VDRF- trach removed just prior to discharge.  He presents from the nursing home with gross hematuria starting on the morning of admission. Hb 7.3- was 7,7 when discharged. Send to [facility] for Urology assistance.   Subjective: No c/o pain, nausea, vomiting, cough.  He thinks the "catheter" has been removed. No longer has CBI but still has foley. Per staff, he has been quite rude, cursing, spitting.   Assessment/Plan: Principal Problem:  Hematuria/ bladder outlet obstruction due to clots - evaluated by urology, Dr. Annabell Howells who feels this is likely secondary to his severely enlarged prostate-prostate measures close to 6-7 cm on CT scan -hold aspirin - taken to OR on evening of 7/18- clots evacuated- bleeders in neck of bladder fulgurated- started on CBI which has been discontinue- blood tinged urine in foley bag- started on Finasteride- Dr Annabell Howells recommends he follow up with urology at [facility]- patient states he does not have a urologist -UA was done and was positive but it does not appear culture was sent- ordered a culture now-he already received Rocephin in the ER at [facility]- given Ancef prior to OR yesterday- will not resume antibiotics yet  Active Problems: Anemia: -Has chronic anemia with hemoglobin of 7.7 when discharged, this was 7.3 when in ER at [place]- given 1 U PRBC last night - CBC 6.5 this AM- on 2nd unit of blood- f/u Hb  -last anemia panel consistent with anemia of chronic disease - due to CKD 4, will give 200 mcg of procrit now   CKD (chronic kidney disease) stage 4, GFR 15-29 ml/min -Creatinine actually  slightly improved from when he was discharged when it was 4.59   LVH (left ventricular hypertrophy)/ Essential hypertension/ PRESS last admission -resumed verapamil - d/c IVF- follow BP   Barrett's esophagus -Resumed PPI  Dysphagia -disharged on a honey thick liquid diet last time- have resumed this  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 - cont supplements  Recent tracheotomy  Cocaine abuse  Pressure ulcer   Code Status: full code Family Communication:  Disposition Plan: back to NS in [place] when OK with urology- will need to request nursing facility to make app with local urologist  DVT prophylaxis: SCDs Consultants:Urology Procedures:cystoscopy   Antibiotics: Anti-infectives    Start     Dose/Rate Route Frequency Ordered Stop   04/04/15 1728  ceFAZolin (ANCEF) IVPB 2 g/50 mL premix     2 g 100 mL/hr over 30 Minutes Intravenous 30 min pre-op 04/04/15 1728 04/04/15 1925      Objective: Filed Weights   04/04/15 2230  Weight: 44.5 kg (98 lb 1.7 oz)    Intake/Output Summary (Last 24 hours) at 04/05/15 1401 Last data filed at 04/05/15 1236  Gross per 24 hour  Intake  95621 ml  Output   8725 ml  Net   2680 ml     Vitals Filed Vitals:   04/05/15 0900 04/05/15 0940 04/05/15 1145 04/05/15 1322  BP: 129/87 127/99 162/88 183/91  Pulse: 78 77 74 76  Temp: 97.5 F (36.4 C) 98 F (36.7 C) 97.7 F (36.5 C) 97.5 F (36.4 C)  TempSrc: Axillary Oral Oral Axillary  Resp: 12 12 14 16   Height:      Weight:      SpO2: 100%       Exam:  General:  Pt is alert, not in acute distress- cachectic  HEENT: No icterus, No thrush, oral mucosa moist  Cardiovascular: regular rate and rhythm, S1/S2 No murmur  Respiratory: clear to auscultation bilaterally   Abdomen: Soft, +Bowel sounds, non tender, non distended, no  guarding  MSK: No LE edema, cyanosis or clubbing  Data Reviewed: Basic Metabolic Panel:  Recent Labs Lab 04/05/15 0101 04/05/15 0403  NA 136 137  K 4.7 4.7  CL 108 109  CO2 19* 21*  GLUCOSE 76 65  BUN 59* 61*  CREATININE 3.00* 3.01*  CALCIUM 7.8* 7.9*   Liver Function Tests: No results for input(s): AST, ALT, ALKPHOS, BILITOT, PROT, ALBUMIN in the last 168 hours. No results for input(s): LIPASE, AMYLASE in the last 168 hours. No results for input(s): AMMONIA in the last 168 hours. CBC:  Recent Labs Lab 04/04/15 2108 04/05/15 0130 04/05/15 0403  WBC 38.5*  --  26.5*  HGB 5.9* 7.4* 6.5*  HCT 18.0* 21.8* 19.3*  MCV 90.5  --  87.3  PLT 305  --  212   Cardiac Enzymes: No results for input(s): CKTOTAL, CKMB, CKMBINDEX, TROPONINI in the last 168 hours. BNP (last 3 results) No results for input(s): BNP in the last 8760 hours.  ProBNP (last 3 results) No results for input(s): PROBNP in the last 8760 hours.  CBG:  Recent Labs Lab 04/04/15 2108  GLUCAP 120*    Recent Results (from the past 240 hour(s))  Surgical pcr screen     Status: None   Collection Time: 04/04/15  5:41 PM  Result Value Ref Range Status   MRSA, PCR NEGATIVE NEGATIVE Final   Staphylococcus aureus NEGATIVE NEGATIVE Final    Comment:        The Xpert SA Assay (FDA approved for NASAL specimens in patients over 65 years of age), is one component of a comprehensive surveillance program.  Test performance has been validated by [facility] for patients greater than or equal to 651 year old. It is not intended to diagnose infection nor to guide or monitor treatment.      Studies: No results found.  Scheduled Meds:  Scheduled Meds: . sodium chloride   Intravenous Once  . feeding supplement (ENSURE ENLIVE)  237 mL Oral BID BM  . finasteride  5 mg Oral Daily  . multivitamin with minerals  1 tablet Oral Daily  . pantoprazole  40 mg Oral BID  . sevelamer carbonate  800 mg Oral TID WC  .  verapamil  120 mg Oral 3 times per day  . [START ON 04/06/2015] Vitamin D (Ergocalciferol)  50,000 Units Oral Q7 days   Continuous Infusions: . sodium chloride 50 mL/hr at 04/05/15 0110    Time spent on care of this patient: 35 min   Kasarah Sitts, MD 04/05/2015, 2:01 PM  LOS: 1 day   [facility] Office  [phone] Pager - Text Page per [email or web address] If 7PM-7AM, please contact night-coverage [email or web address]

## 2015-04-05 NOTE — Progress Notes (Signed)
CRITICAL VALUE ALERT  Critical value received:  Hgb 6.5 Glucose 65  Date of notification:  04/05/15  Time of notification:  0445  Critical value read back:Yes.    Nurse who received alert:  Lorretta Harp Yang Rack, RN  MD notified (1st page):  Donnamarie PoagK Kirby  Time of first page:  0500  MD notified (2nd page):  Time of second page:  Responding MD:  Donnamarie PoagK Kirby  Time MD responded:  [phone]

## 2015-04-05 NOTE — Progress Notes (Signed)
Patient ID: Lee Swanson, male   DOB: 09-Aug-1951, 64 y.o.   MRN: [id number] 1 Day Post-Op  Subjective: Lee Swanson feels better following the procedure.  His urine is blush pink on minimal irrigation.   He has some residual suprapubic pain.  He reports that he did have a foley at the rehab center and that it was pulled out by accident which would explain the urethral findings at cystoscopy.  ROS:  Review of Systems  Constitutional: Negative for fever.  Gastrointestinal: Negative for nausea.    Anti-infectives: Anti-infectives    Start     Dose/Rate Route Frequency Ordered Stop   04/04/15 1728  ceFAZolin (ANCEF) IVPB 2 g/50 mL premix     2 g 100 mL/hr over 30 Minutes Intravenous 30 min pre-op 04/04/15 1728 04/04/15 1925      Current Facility-Administered Medications  Medication Dose Route Frequency Provider Last Rate Last Dose  . 0.9 %  sodium chloride infusion   Intravenous Continuous [name], NP 50 mL/hr at 04/05/15 0110    . 0.9 %  sodium chloride infusion   Intravenous Once [name], NP      . multivitamin with minerals tablet 1 tablet  1 tablet Oral Daily [name], MD      . ondansetron [facility]) tablet 4 mg  4 mg Oral Q6H PRN [name], MD       Or  . ondansetron (ZOFRAN) injection 4 mg  4 mg Intravenous Q6H PRN [name], MD      . pantoprazole (PROTONIX) EC tablet 40 mg  40 mg Oral BID [name], MD      . sevelamer carbonate (RENVELA) tablet 800 mg  800 mg Oral TID WC [name], MD      . verapamil (CALAN) tablet 120 mg  120 mg Oral 3 times per day [name], MD   120 mg at 04/05/15 0707  . [START ON 04/06/2015] Vitamin D (Ergocalciferol) (DRISDOL) capsule 50,000 Units  50,000 Units Oral Q7 days [name], MD         Objective: Vital signs in last 24 hours: Temp:  [93.3 F (34.1 C)-98 F (36.7 C)] 98 F (36.7 C) (07/19 0521) Pulse Rate:  [68-88] 79 (07/19 0521) Resp:  [6-18] 7 (07/19 0521) BP: (139-189)/(85-104) 180/89 mmHg  (07/19 0521) SpO2:  [97 %-100 %] 100 % (07/19 0521) Weight:  [44.5 kg (98 lb 1.7 oz)] 44.5 kg (98 lb 1.7 oz) (07/18 2230)  Intake/Output from previous day: 07/18 0701 - 07/19 0700 In: 7600 [I.V.:1141.7; Blood:458.3] Out: 7175 [Urine:7175] Intake/Output this shift: Total I/O In: -  Out: 950 [Urine:950]   Physical Exam  Constitutional: He is well-developed, well-nourished, and in no distress.  Abdominal: Soft. There is tenderness (suprapubic).  Genitourinary:  Foley draining light pink urine.  Vitals reviewed.   Lab Results:   Recent Labs  04/04/15 2108 04/05/15 0130 04/05/15 0403  WBC 38.5*  --  26.5*  HGB 5.9* 7.4* 6.5*  HCT 18.0* 21.8* 19.3*  PLT 305  --  212   BMET  Recent Labs  04/05/15 0101 04/05/15 0403  NA 136 137  K 4.7 4.7  CL 108 109  CO2 19* 21*  GLUCOSE 76 65  BUN 59* 61*  CREATININE 3.00* 3.01*  CALCIUM 7.8* 7.9*   PT/INR  Recent Labs  04/05/15 0403  LABPROT 15.4*  INR 1.21   ABG No results for input(s): PHART, HCO3 in the last 72 hours.  Invalid input(s): PCO2,  PO2  Studies/Results: No results found.   Assessment: s/p Procedure(s): CYSTOSCOPY WITH CLOT EVACUATION/ FULGERATION OF PROSTATE  Gross hematuria with clot retention was probably secondary to urethral trauma from the foley being pulled out with the balloon inflated. He is better post clot evac and the urine is essentially clear.   Anemia from blood loss on top of chronic disease.   Plan: D/C CBI and plug irritation port. I will start finasteride because of the patient's prostate size. He could f/u with [place] or a urologist in [place] when he completes his rehab.      LOS: 1 day    [name] 04/05/2015

## 2015-04-05 NOTE — Progress Notes (Signed)
Initial Nutrition Assessment  DOCUMENTATION CODES:   Severe malnutrition in context of chronic illness, Severe malnutrition in context of social or environmental circumstances, Underweight  INTERVENTION:  - Continue Thick It as needed  - Will order Ensure Enlive BID, each supplement provides 350 kcal and 20 grams of protein; Thick It must be mixed with supplement prior to being given to pt - RD will continue to monitor for needs  NUTRITION DIAGNOSIS:   Malnutrition related to social / environmental circumstances, chronic illness as evidenced by severe depletion of muscle mass, severe depletion of body fat.  GOAL:   Patient will meet greater than or equal to 90% of their needs  MONITOR:   PO intake, Supplement acceptance, Weight trends, Labs, I & O's  REASON FOR ASSESSMENT:   Other (Comment) (underweight BMI)  ASSESSMENT:  64 y.o. male who was discharged from [facility] on 7/8 after a 1 month stay to rehab. He was hospitalized for hypertensive emergency, cocaine use, Press syndrome, Small SAH, multifocal infarcts. Had VDRF- trach removed just prior to discharge. He presents from the nursing home with gross hematuria starting today.   Pt seen for underweight BMI. Pt able to provide limited information. This AM he was given a cup of orange juice, which he states he drinks every morning, and he is waiting on milk, which he also reports he drinks each day. He states he typically eats cereal for breakfast. Pt is currently on Dysphagia 1 diet with honey thick liquids; will order Ensure Enlive which will need to be mixed with thickener.  Per weight hx review, pt has lost 17 lbs (15% body weight) in the past 2 months which is significant for time frame. Severe muscle and fat wasting present.  No intakes documented but pt likely not meeting needs. Medications reviewed. Labs reviewed; BUN/creatinine elevated, GFR: 24.   Diet Order:  DIET - DYS 1 Room service appropriate?: Yes; Fluid  consistency:: Honey Thick  Skin:  Wound (see comment) (Stage 2 sacral pressure ulcer, wounds to throat and penis)  Last BM:  7/18  Height:   Ht Readings from Last 1 Encounters:  04/04/15 5\' 6"  (1.676 m)    Weight:   Wt Readings from Last 1 Encounters:  04/04/15 98 lb 1.7 oz (44.5 kg)    Ideal Body Weight:  64.54 kg (kg)  Wt Readings from Last 10 Encounters:  04/04/15 98 lb 1.7 oz (44.5 kg)  03/25/15 98 lb 15.8 oz (44.9 kg)  02/08/15 115 lb (52.164 kg)  01/29/15 115 lb 11.2 oz (52.481 kg)    BMI:  Body mass index is 15.84 kg/(m^2).  Estimated Nutritional Needs:   Kcal:  1400-1600  Protein:  60-70 grams  Fluid:  2 L/day  EDUCATION NEEDS:   No education needs identified at this time     Trenton GammonJessica Virgene Tirone, RD, LDN Inpatient Clinical Dietitian Pager # [phone] After hours/weekend pager # [phone]

## 2015-04-05 NOTE — Care Management Note (Signed)
Case Management Note  Patient Details  Name: Lee Swanson MRN: [id number] Date of Birth: [birth year]  Subjective/Objective:                   CYSTOSCOPY WITH CLOT EVACUATION/ FULGERATION OF PROSTATE (N/A) Action/Plan:  dishcarge planning Expected Discharge Date:  04/06/15               Expected Discharge Plan:  Skilled Nursing Facility  In-House Referral:     Discharge planning Services  CM Consult  Post Acute Care Choice:    Choice offered to:     DME Arranged:    DME Agency:     HH Arranged:    HH Agency:     Status of Service:     Medicare Important Message Given:    Date Medicare IM Given:    Medicare IM give by:    Date Additional Medicare IM Given:    Additional Medicare Important Message give by:     If discussed at Long Length of Stay Meetings, dates discussed:    Additional Comments: CM discussed pt in progression with MD and plan is for discharge tomorrow to [facility]; CSW, Tresa EndoKelly aware and arranging.  No other CM needs were communicated. Yves DillJeffries, Cyndal Kasson Christine, RN 04/05/2015, 12:09 PM

## 2015-04-05 NOTE — Op Note (Signed)
Lee Rhine:  Swanson, Lee Swanson             ACCOUNT NO.:  [id number]  MEDICAL RECORD NO.:  [id number]  LOCATION:  1409                         [facility]  PHYSICIAN:  Excell SeltzerJohn J. Annabell HowellsWrenn, M.D.    DATE OF BIRTH:  1950-12-30  DATE OF PROCEDURE:  04/04/2015 DATE OF DISCHARGE:                              OPERATIVE REPORT   PROCEDURE:  Cystoscopy with evacuation of clots and fulguration of bladder neck bleeders.  PREOPERATIVE DIAGNOSIS:  Clot retention.  POSTOPERATIVE DIAGNOSIS:  Bleeding from the bladder neck.  SURGEON:  Excell SeltzerJohn J. Annabell HowellsWrenn, M.D.  ANESTHESIA:  General.  SPECIMEN:  None.  DRAINS:  A 22-French 3-way Foley catheter.  BLOOD LOSS:  Approximately 300 mL of clot.  COMPLICATIONS:  None.  INDICATIONS:  Lee Swanson is a 64 year old African American male, who is in a rehab center following a prolonged hospitalization for cocaine induced hypertensive stroke.  He developed gross hematuria this morning. He was sent to [facility] where a CT scan demonstrated a large clot in his bladder with retention and his prostate was quite large measuring approximately 6 x 6 x 7 cm.  He was sent by ambulance to [facility] because of prior hospitalization, had been here, and he had just been discharged 10 days ago.  The Foley catheter apparently was placed in the ER, but was noted to be out of the patient upon arrival.  On my review of the x-rays in the patient's condition, I felt the clot evacuation in the operating room was more appropriate than an attempt at the bedside.  FINDINGS OF PROCEDURE:  He was given Ancef, he was taken to the operating room where general anesthetic was induced.  He was placed in lithotomy position.  His perineum and genitalia were prepped with Betadine solution.  He was draped in usual sterile fashion.  Cystoscopy was performed using a 23-French scope and 70 degree lens. Examination revealed a somewhat clot filled urethra, which was not readily  inspected.  The prostatic urethra was approximately 5 to 6 cm in length with bilobar hyperplasia with coaptation and obstruction.  I was not able to identify active bleeding initially.  The bladder, however, was full of clot.  This was evacuated using the Orroomey syringe, and eventually I was able to get all the clot out.  Inspection revealed some irritation of the bladder wall consistent with retention and the irrigation attempt, but no active bleeding from the bladder wall.  I then placed a 28-French continuous flow resectoscope sheath, and fitted that with a 30 degree lens, the Iglesias handle and button electrode. Saline was used the irrigant and noted some bleeding at the bladder neck at about 12 o'clock.  This was fulgurated as well as some bleeding from the anterior bladder neck.  Further inspection of bladder revealed no other active bleeding.  Once the fulguration was completed, inspection revealed no retained bladder clots.  The ureteral orifices were intact. There was no active bleeding.  The scope was then removed and anterior tear in the urethra was noted in the bulb and it was difficult to tell whether this was from prior catheter placement or recent instrumentation, it was not actively bleeding.  At this  point, a 22-French Foley catheter was placed with the aid of a catheter guide.  The balloon was filled with sterile 30 mL of sterile fluid.  The catheter was irrigated with clear return and placed to continuous irrigation and straight drainage.  He was then taken down from lithotomy position.  His anesthetic was reversed.  He was moved to recovery in stable condition.  There were no complications.     Excell Seltzer. Annabell Howells, M.D.     JJW/MEDQ  D:  04/04/2015  T:  04/05/2015  Job:  [id number]

## 2015-04-05 NOTE — Evaluation (Signed)
Physical Therapy Evaluation Patient Details Name: Lee Swanson R Dreisbach MRN: [id number] DOB: 04-22-1951 Today's Date: 04/05/2015   History of Present Illness  64 y.o. male admitted from SNF with hematuria, s/p cystoscopy 04/04/15. Recent admission [facility]-03/25/15 with SAH, subdural hematoma, CVA. PMH of medical noncompliance, HTN, tobacco/cocaine/EtOH use, CKD, hep C.   Clinical Impression  Pt admitted with above diagnosis. Pt currently with functional limitations due to the deficits listed below (see PT Problem List). Mod assist bed mobility, max assist to stand. Pt unable to ambulate, stated he's been "tied to the bed at the nursing home".  He has significant BLE weakness, L moreso than R.  Pt will benefit from skilled PT to increase their independence and safety with mobility to allow discharge to the venue listed below.       Follow Up Recommendations SNF;Supervision/Assistance - 24 hour (vs SNF)    Equipment Recommendations  Wheelchair (measurements PT);Wheelchair cushion (measurements PT);Rolling walker with 5" wheels    Recommendations for Other Services       Precautions / Restrictions Precautions Precautions: Fall Precaution Comments: pt reports he fell 2 weeks ago in hospital when he tried to get up to bathroom Restrictions Weight Bearing Restrictions: No      Mobility  Bed Mobility Overal bed mobility: Needs Assistance Bed Mobility: Supine to Sit     Supine to sit: Max assist     General bed mobility comments: Max A to raise trunk, pt refused to attempt rolling, he wanted to pull up on my hand to raise trunk  Transfers Overall transfer level: Needs assistance Equipment used: 1 person hand held assist Transfers: Sit to/from Stand Sit to Stand: Mod assist         General transfer comment: Mod assist for boost to stand. Pt unable to weight shift to [facility]. Pt stood for 10 seconds then stated he couldn't stand any longer.   Ambulation/Gait                 Stairs            Wheelchair Mobility    Modified Rankin (Stroke Patients Only) Modified Rankin (Stroke Patients Only) Pre-Morbid Rankin Score: No significant disability (Per chart review, but not clear PLOF.) Modified Rankin: Severe disability     Balance   Sitting-balance support: Bilateral upper extremity supported Sitting balance-Leahy Scale: Poor Sitting balance - Comments: props on BUEs   Standing balance support: Single extremity supported Standing balance-Leahy Scale: Poor                               Pertinent Vitals/Pain Faces Pain Scale: Hurts a little bit Pain Location: back Pain Descriptors / Indicators: Sore Pain Intervention(s): Limited activity within patient's tolerance;Monitored during session;Repositioned    Home Living Family/patient expects to be discharged to:: Skilled nursing facility Living Arrangements: Alone                    Prior Function           Comments: pt vague historian, stated he hasn't walked in a month and that he was tied to the bed while he was at SNF     Hand Dominance        Extremity/Trunk Assessment               Lower Extremity Assessment: LLE deficits/detail;RLE deficits/detail RLE Deficits / Details: knee extension 2/5, pt refused to  attempt ankle movement, minimally wiggled R toes LLE Deficits / Details: Decreased strength and AROM consistent with deficits from a stroke. Pt with little active movement of the LLE. Attempted knee extension sitting on EOB, no active quad contraction noted.   Cervical / Trunk Assessment: Other exceptions  Communication   Communication: No difficulties  Cognition Arousal/Alertness: Awake/alert Behavior During Therapy: Agitated;Anxious Overall Cognitive Status: Difficult to assess (at times responses are appropriate, other times they are not, pt stated he wouldn't get to EOB, but then did)                      General Comments       Exercises        Assessment/Plan    PT Assessment Patient needs continued PT services  PT Diagnosis Difficulty walking;Hemiplegia non-dominant side;Generalized weakness   PT Problem List Decreased strength;Decreased activity tolerance;Decreased balance;Decreased mobility  PT Treatment Interventions DME instruction;Gait training;Functional mobility training;Therapeutic activities;Therapeutic exercise;Neuromuscular re-education;Patient/family education   PT Goals (Current goals can be found in the Care Plan section) Acute Rehab PT Goals Patient Stated Goal: to be able to get up to the bathroom PT Goal Formulation: With patient Time For Goal Achievement: 04/19/15 Potential to Achieve Goals: Fair    Frequency Min 3X/week   Barriers to discharge        Co-evaluation               End of Session Equipment Utilized During Treatment: Gait belt Activity Tolerance: Patient limited by fatigue Patient left: with call bell/phone within reach;in bed;with bed alarm set Nurse Communication: Mobility status         Time: 1411-1430 PT Time Calculation (min) (ACUTE ONLY): 19 min   Charges:   PT Evaluation $Initial PT Evaluation Tier I: 1 Procedure     PT G CodesTamala Ser 04/05/2015, 2:50 PM [phone]

## 2015-04-06 DIAGNOSIS — R310 Gross hematuria: Principal | ICD-10-CM

## 2015-04-06 LAB — CBC
HCT: 20.2 % — ABNORMAL LOW (ref 39.0–52.0)
HEMATOCRIT: 20 % — AB (ref 39.0–52.0)
HEMOGLOBIN: 6.7 g/dL — AB (ref 13.0–17.0)
Hemoglobin: 6.8 g/dL — CL (ref 13.0–17.0)
MCH: 30 pg (ref 26.0–34.0)
MCH: 29.7 pg (ref 26.0–34.0)
MCHC: 33.5 g/dL (ref 30.0–36.0)
MCHC: 33.7 g/dL (ref 30.0–36.0)
MCV: 89.7 fL (ref 78.0–100.0)
MCV: 88.2 fL (ref 78.0–100.0)
PLATELETS: 199 10*3/uL (ref 150–400)
Platelets: 199 10*3/uL (ref 150–400)
RBC: 2.23 MIL/uL — ABNORMAL LOW (ref 4.22–5.81)
RBC: 2.29 MIL/uL — ABNORMAL LOW (ref 4.22–5.81)
RDW: 17 % — ABNORMAL HIGH (ref 11.5–15.5)
RDW: 16.7 % — ABNORMAL HIGH (ref 11.5–15.5)
WBC: 16.9 10*3/uL — ABNORMAL HIGH (ref 4.0–10.5)
WBC: 17 10*3/uL — ABNORMAL HIGH (ref 4.0–10.5)

## 2015-04-06 LAB — URINE CULTURE: CULTURE: NO GROWTH

## 2015-04-06 LAB — PREPARE RBC (CROSSMATCH)

## 2015-04-06 MED ORDER — SODIUM CHLORIDE 0.9 % IV SOLN
Freq: Once | INTRAVENOUS | Status: AC
  Administered 2015-04-06: 12:00:00 via INTRAVENOUS

## 2015-04-06 MED ORDER — HYDRALAZINE HCL 25 MG PO TABS
25.0000 mg | ORAL_TABLET | Freq: Four times a day (QID) | ORAL | Status: DC
  Administered 2015-04-06 – 2015-04-07 (×3): 25 mg via ORAL
  Filled 2015-04-06 (×7): qty 1

## 2015-04-06 NOTE — Progress Notes (Signed)
[facility] Progress Note   Lee Swanson JYN:[id number] DOB: August 07, 1951 DOA: 04/04/2015 PCP: No primary care provider on file.  Brief narrative: 64 y/o ? recent [facility] hosp stay 5/9--> 5/14 after a 1 month stay to rehab. He was hospitalized for hypertensive emergency, cocaine use, Press syndrome, Small SAH, multifocal infarcts and upper GI bleed 5/10 He was discharged and then readmitted 02/26/15 found to be extremely hypertensive blood pressure 250/155 and intubated and started on Nipride and was cocaine positive on admission--he was diagnosed at that admission with PRES syndrome and an acute CVA with multiple infarcts in the right frontal left parietal right thalamus and right cerebellum and TEE was not performed secondary to prior dysphagia strictures He was kept on a dysphagia diet CT and gram neck was also held because of worsening renal function  Had VDRF as well as ventilator associated pneumonia  trach remov at that pointed just prior to discharge.  Readmitted 7/18 from  nursing home with gross hematuria starting on the morning of admission.  Hb 7.3- was 7,7 when discharged.  Urologist was consulted  Subjective:  Patient sitting at bedside complaining of abdominal pain On closer questioning pain is in his penis Note that penis is wrapped in his bowel and catheter as well as tubing seems to have dark red blood in it Tells me he needs a nicotine patch   Assessment/Plan: Principal Problem:  Hematuria/ bladder outlet obstruction due to clots+ Long urethral tear anterior bulb on cystoscopy -might need to return to or for fulguration per Dr. Annabell Howells on 7/20 - evaluated by urology, Dr. Annabell Howells who feels this is likely secondary to his severely enlarged prostate-prostate measures close to 6-7 cm on CT scan -hold aspirin - taken to OR on evening of 7/18- clots evacuated- bleeders in neck of bladder fulgurated - started on CBI which has been discontinue - blood tinged urine in foley  bag- started on Finasteride - Dr Annabell Howells recommends he follow up with urology at [facility] - patient states he does not have a urologist -UA was done and was positive but it does not appear culture was sent - Urine culture from 7/18 pending -he already received Rocephin in the ER at [facility]   Active Problems: Anemia -last anemia panel consistent with anemia of chronic disease -Hemoglobin 5.9 on admission -Chronic anemia with hemoglobin of 7.7 when discharged, this was 7.3 when in ER at [place]- given 1 U PRBC 7/18 -given  200 mcg of procrit 7/19 -Transfused a7/18, 7/19 and gain overnight 7/19 X14 hemoglobin 6.17   CKD (chronic kidney disease) stage 4, GFR 15-29 ml/min -Creatinine actually slightly improved from when he was discharged when it was 4.59 - continues to improve with IV fluid repletion and transfusion    LVH (left ventricular hypertrophy)/ Essential hypertension/ PRESS last admission -resumed verapamil one 20 mg every 8 hourly  -Add scheduled hydralazine - d/c IVF- follow BP   Barrett's esophagus -Resumed PPI -Outpatient screening as needed   Dysphagia -disharged on a honey thick liquid diet last time - have resumed this  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 - cont supplements  Recent tracheotomy  Cocaine abuse -Hesitate to use beta blocker as he may resume   Pressure ulcer   Code Status: full code Family Communication:  Disposition Plan: back  to NS in [place] when OK with urology - will need to request nursing facility to make app with local urologist  DVT prophylaxis: SCDs Consultants:Urology Procedures:cystoscopy   Antibiotics: Anti-infectives    Start     Dose/Rate Route Frequency Ordered Stop   04/04/15 1728  ceFAZolin (ANCEF) IVPB 2 g/50 mL  premix     2 g 100 mL/hr over 30 Minutes Intravenous 30 min pre-op 04/04/15 1728 04/04/15 1925      Objective: Filed Weights   04/04/15 2230  Weight: 44.5 kg (98 lb 1.7 oz)    Intake/Output Summary (Last 24 hours) at 04/06/15 0830 Last data filed at 04/06/15 0540  Gross per 24 hour  Intake   1355 ml  Output   2475 ml  Net  -1120 ml     Vitals Filed Vitals:   04/05/15 2137 04/06/15 0042 04/06/15 0523 04/06/15 0619  BP: 190/95 160/86 182/100 182/100  Pulse:   90 86  Temp:   98 F (36.7 C) 97.8 F (36.6 C)  TempSrc:   Oral Oral  Resp:   16 14  Height:      Weight:      SpO2:   100% 100%    Exam:  General:  Pt is alert, not in acute distress- cachecti with bitemporal is wasting c--seems unhappy complaining   HEENT: No icterus, pallor noted , No thrush, oral mucosa moist  Cardiovascular: regular rate and rhythm, S1/S2 No murmur  Respiratory: clear to auscultation bilaterally   Abdomen: Soft, +Bowel sounds, non tender, non distended, no guarding  MSK: No LE edema, cyanosis or clubbing  Data Reviewed: Basic Metabolic Panel:  Recent Labs Lab 04/05/15 0101 04/05/15 0403  NA 136 137  K 4.7 4.7  CL 108 109  CO2 19* 21*  GLUCOSE 76 65  BUN 59* 61*  CREATININE 3.00* 3.01*  CALCIUM 7.8* 7.9*   Liver Function Tests: No results for input(s): AST, ALT, ALKPHOS, BILITOT, PROT, ALBUMIN in the last 168 hours. No results for input(s): LIPASE, AMYLASE in the last 168 hours. No results for input(s): AMMONIA in the last 168 hours. CBC:  Recent Labs Lab 04/04/15 2108 04/05/15 0130 04/05/15 0403 04/05/15 1511 04/06/15 0430  WBC 38.5*  --  26.5* 18.0* 16.9*  HGB 5.9* 7.4* 6.5* 7.8* 6.7*  HCT 18.0* 21.8* 19.3* 23.3* 20.0*  MCV 90.5  --  87.3 89.6 89.7  PLT 305  --  212 196 199   Cardiac Enzymes: No results for input(s): CKTOTAL, CKMB, CKMBINDEX, TROPONINI in the last 168 hours. BNP (last 3 results) No results for input(s): BNP in the last 8760  hours.  ProBNP (last 3 results) No results for input(s): PROBNP in the last 8760 hours.  CBG:  Recent Labs Lab 04/04/15 2108  GLUCAP 120*    Recent Results (from the past 240 hour(s))  Surgical pcr screen     Status: None   Collection Time: 04/04/15  5:41 PM  Result Value Ref Range Status   MRSA, PCR NEGATIVE NEGATIVE Final   Staphylococcus aureus NEGATIVE NEGATIVE Final    Comment:        The Xpert SA Assay (FDA approved for NASAL specimens in patients over 65 years of age), is one component of a comprehensive surveillance program.  Test performance has been validated by [facility] for patients greater than or equal to 274 year old. It is not intended to diagnose infection nor to guide or monitor treatment.      Studies: No results  found.  Scheduled Meds:  Scheduled Meds: . sodium chloride   Intravenous Once  . sodium chloride   Intravenous Once  . feeding supplement (ENSURE ENLIVE)  237 mL Oral BID BM  . finasteride  5 mg Oral Daily  . multivitamin with minerals  1 tablet Oral Daily  . pantoprazole  40 mg Oral BID  . sevelamer carbonate  800 mg Oral TID WC  . verapamil  120 mg Oral 3 times per day  . Vitamin D (Ergocalciferol)  50,000 Units Oral Q7 days   Continuous Infusions:    Time spent on care of this patient: 35 min  Pleas Koch, MD [facility] [phone]

## 2015-04-06 NOTE — Progress Notes (Signed)
Patient ID: Lee Swanson, male   DOB: 02-Apr-1951, 64 y.o.   MRN: [id number] 2 Days Post-Op  Subjective: Lee Swanson is 2 days post op from cysto clot evacuation.  He has continued to have urethral bleeding and appeared to have a long urethra tear in the anterior bulb on cystoscopy.   He has no significant pain and the urine in the foley tube is only very light pink.   His Hgb is back down to 6.7. ROS:  Review of Systems  Constitutional: Negative for fever and chills.  Gastrointestinal: Negative for nausea and abdominal pain.  Genitourinary:       He reports continued urethral bleeding.     Anti-infectives: Anti-infectives    Start     Dose/Rate Route Frequency Ordered Stop   04/04/15 1728  ceFAZolin (ANCEF) IVPB 2 g/50 mL premix     2 g 100 mL/hr over 30 Minutes Intravenous 30 min pre-op 04/04/15 1728 04/04/15 1925      Current Facility-Administered Medications  Medication Dose Route Frequency Provider Last Rate Last Dose  . 0.9 %  sodium chloride infusion   Intravenous Once [name], NP      . 0.9 %  sodium chloride infusion   Intravenous Once [name], NP      . feeding supplement (ENSURE ENLIVE) (ENSURE ENLIVE) liquid 237 mL  237 mL Oral BID BM Anderson Malta [place], RD   237 mL at 04/05/15 1325  . finasteride (PROSCAR) tablet 5 mg  5 mg Oral Daily [name], MD   5 mg at 04/05/15 0942  . food thickener (THICK IT) powder   Oral PRN [name], MD      . hydrALAZINE (APRESOLINE) injection 10 mg  10 mg Intravenous Q6H PRN [name], MD   10 mg at 04/06/15 0630  . multivitamin with minerals tablet 1 tablet  1 tablet Oral Daily [name], MD   1 tablet at 04/05/15 0941  . ondansetron (ZOFRAN) tablet 4 mg  4 mg Oral Q6H PRN [name], MD       Or  . ondansetron (ZOFRAN) injection 4 mg  4 mg Intravenous Q6H PRN [name], MD      . pantoprazole (PROTONIX) EC tablet 40 mg  40 mg Oral BID [name], MD   40 mg at 04/05/15 2313  . sevelamer  carbonate (RENVELA) tablet 800 mg  800 mg Oral TID WC [name], MD   800 mg at 04/05/15 1702  . verapamil (CALAN) tablet 120 mg  120 mg Oral 3 times per day [name], MD   120 mg at 04/06/15 0807  . Vitamin D (Ergocalciferol) (DRISDOL) capsule 50,000 Units  50,000 Units Oral Q7 days [name], MD         Objective: Vital signs in last 24 hours: Temp:  [97.5 F (36.4 C)-98.2 F (36.8 C)] 97.8 F (36.6 C) (07/20 0619) Pulse Rate:  [74-90] 86 (07/20 0619) Resp:  [12-20] 14 (07/20 0619) BP: (127-190)/(86-100) 182/100 mmHg (07/20 0619) SpO2:  [100 %] 100 % (07/20 0619)  Intake/Output from previous day: 07/19 0701 - 07/20 0700 In: 4205 [P.O.:550; I.V.:475; Blood:330] Out: 4025 [Urine:4025] Intake/Output this shift:     Physical Exam  Constitutional:  Cachectic WD BM in NAD  Abdominal: Soft. There is no tenderness.  Genitourinary:   Normal penis with foley at the meatus.  He is oozing bright red blood around the foley and had about 10ml in the towels around the  penis which was from the last 3 hours.     Lab Results:   Recent Labs  04/05/15 1511 04/06/15 0430  WBC 18.0* 16.9*  HGB 7.8* 6.7*  HCT 23.3* 20.0*  PLT 196 199   BMET  Recent Labs  04/05/15 0101 04/05/15 0403  NA 136 137  K 4.7 4.7  CL 108 109  CO2 19* 21*  GLUCOSE 76 65  BUN 59* 61*  CREATININE 3.00* 3.01*  CALCIUM 7.8* 7.9*   PT/INR  Recent Labs  04/05/15 0403  LABPROT 15.4*  INR 1.21   ABG No results for input(s): PHART, HCO3 in the last 72 hours.  Invalid input(s): PCO2, PO2  Studies/Results: No results found.   Assessment: s/p Procedure(s): CYSTOSCOPY WITH CLOT EVACUATION/ FULGERATION OF PROSTATE  He has persistent urethral bleeding with anemia, but the urine remains clear.   Plan: I have wrapped the penis with a Kerlix to provide gentle compression.   If the bleeding persists, he will need a return to the OR for fulguration.     LOS: 2 days    Lee Swanson,Lee Hevia  Swanson 04/06/2015

## 2015-04-06 NOTE — Progress Notes (Signed)
Attempted to Place PICC Line and patient is refusing to have it done at this time stating he is done all he wants to do is sleep and he is having nothing else done. Staff nurse aware .

## 2015-04-06 NOTE — Clinical Documentation Improvement (Signed)
  BMI= 15.84 04/05/15 Registered Dietician's note: "Severe malnutrition in context of chronic illness.  Severe malnutrition in context of social or environmental circumstances, underweight."  Please clarify if you agree with the Registered Dietician's assessment and document in your progress note and Discharge Summary.  Thank you!  Sharyn Creameronna Harla Mensch, BSN, RN [facility] HIM/Clinical Documentation Specialist [email or web address] [phone]

## 2015-04-06 NOTE — Progress Notes (Signed)
CRITICAL VALUE ALERT  Critical value received:  Hemoglobin 6.7  Date of notification:  04/06/2015  Time of notification:  0515  Critical value read back:Yes.     Nurse who received alert:  Tejon Gracie   MD notified (1st page): yes  Time of first page:  0521  MD notified (2nd page):  Time of second page:  Responding MD:  Craige CottaKirby  Time MD responded: [phone]

## 2015-04-06 NOTE — Care Management Note (Signed)
Case Management Note  Patient Details  Name: Lee Swanson MRN: [id number] Date of Birth: 1951/06/23  Subjective/Objective:64 y/o m admittedf w/hematuria. Recent admission.From SNF.                    Action/Plan:d/c plan return to SNF.   Expected Discharge Date:                  Expected Discharge Plan:  Skilled Nursing Facility  In-House Referral:     Discharge planning Services  CM Consult  Post Acute Care Choice:    Choice offered to:     DME Arranged:    DME Agency:     HH Arranged:    HH Agency:     Status of Service:     Medicare Important Message Given:    Date Medicare IM Given:    Medicare IM give by:    Date Additional Medicare IM Given:    Additional Medicare Important Message give by:     If discussed at Long Length of Stay Meetings, dates discussed:    Additional Comments:  Lanier ClamMahabir, Zarie Kosiba, RN 04/06/2015, 3:31 PM

## 2015-04-06 NOTE — Progress Notes (Signed)
Pt refused to have PICC line placement.  Explained to the pt that he needs the PICC for blood transfusion. Explained the function of hemoglobin. Pt states he wants to be left alone and just let me die.  MD made aware.

## 2015-04-07 DIAGNOSIS — F1910 Other psychoactive substance abuse, uncomplicated: Secondary | ICD-10-CM

## 2015-04-07 DIAGNOSIS — F4323 Adjustment disorder with mixed anxiety and depressed mood: Secondary | ICD-10-CM | POA: Clinically undetermined

## 2015-04-07 MED ORDER — VERAPAMIL HCL 120 MG PO TABS: 120.0000 mg | ORAL_TABLET | Freq: Three times a day (TID) | ORAL | Status: AC

## 2015-04-07 MED ORDER — FINASTERIDE 5 MG PO TABS: 5.0000 mg | ORAL_TABLET | Freq: Every day | ORAL | Status: AC

## 2015-04-07 MED ORDER — STARCH (THICKENING) PO POWD: 1.0000 g | ORAL | Status: AC | PRN

## 2015-04-07 NOTE — Progress Notes (Signed)
[facility] Progress Note   TERRAL COOKS ZHY:[id number] DOB: 12-20-1950 DOA: 04/04/2015 PCP: No primary care provider on file.  Brief narrative: 64 y/o ? recent [facility] hosp stay 5/9--> 5/14 after a 1 month stay to rehab. He was hospitalized for hypertensive emergency, cocaine use, Press syndrome, Small SAH, multifocal infarcts and upper GI bleed 5/10 He was discharged and then readmitted 02/26/15 found to be extremely hypertensive blood pressure 250/155 and intubated and started on Nipride and was cocaine positive on admission--he was diagnosed at that admission with PRES syndrome and an acute CVA with multiple infarcts in the right frontal left parietal right thalamus and right cerebellum and TEE was not performed secondary to prior dysphagia strictures He was kept on a dysphagia diet CT and gram neck was also held because of worsening renal function  Had VDRF as well as ventilator associated pneumonia  trach remov at that pointed just prior to discharge.  Readmitted 7/18 from  nursing home with gross hematuria starting on the morning of admission.  Hb 7.3- was 7,7 when discharged.  Urologist was consulted Psychiatry was consulted as it was felt he was making poor healthcare decisions and they did determine that patient has capacity  Subjective:  Refusing lab draws, meds "I wanna go home"   Assessment/Plan: Principal Problem:  Hematuria/ bladder outlet obstruction due to clots+ Long urethral tear anterior bulb on cystoscopy -might need to return to or for fulguration per Dr. Annabell Howells on 7/20 - evaluated by urology, Dr. Annabell Howells who feels this is likely secondary to his severely enlarged prostate-prostate measures close to 6-7 cm on CT scan -hold aspirin - taken to OR on evening of 7/18- clots evacuated- bleeders in neck of bladder fulgurated - started on CBI which has been discontinue - blood tinged urine in foley bag- started on Finasteride - Dr Annabell Howells recommends he follow up with  urology at [facility] - patient states he does not have a urologist -UA was done and was positive but it does not appear culture was sent - Urine culture from 7/18 neg -D/c Abx    Active Problems: Anemia -last anemia panel consistent with anemia of chronic disease -Hemoglobin 5.9 on admission -Chronic anemia with hemoglobin of 7.7 when discharged, this was 7.3 when in ER at [place]- given 1 U PRBC 7/18 -given  200 mcg of procrit 7/19 -Transfused a7/18, 7/19 and gain overnight 7/19 X14 hemoglobin 6.17 -refusing labs draws and meds -counselled multiple x's    CKD (chronic kidney disease) stage 4, GFR 15-29 ml/min -Creatinine much  improved from when he was discharged when it was 4.59 -refusing further lab draws   LVH (left ventricular hypertrophy)/ Essential hypertension/ PRESS last admission -resumed verapamil one 20 mg every 8 hourly  -Add scheduled hydralazine - d/c IVF- follow BP   Barrett's esophagus -Resumed PPI -Outpatient screening as needed if he uis agreeable Dysphagia -disharged on a honey thick liquid diet last time - have resumed this  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 - cont supplements  Recent tracheotomy  Cocaine abuse -Hesitate to use beta blocker as he may resume Cocaine  Pressure ulcer   Code Status: full code Family Communication:  Disposition Plan: back to NS in [place] when OK with urology - will need to request nursing facility to make app  with local urologist  DVT prophylaxis: SCDs Consultants:Urology Procedures:cystoscopy   Antibiotics: Anti-infectives    Start     Dose/Rate Route Frequency Ordered Stop   04/04/15 1728  ceFAZolin (ANCEF) IVPB 2 g/50 mL premix     2 g 100 mL/hr over 30 Minutes Intravenous 30 min pre-op 04/04/15  1728 04/04/15 1925      Objective: Filed Weights   04/04/15 2230  Weight: 44.5 kg (98 lb 1.7 oz)    Intake/Output Summary (Last 24 hours) at 04/07/15 1340 Last data filed at 04/07/15 0925  Gross per 24 hour  Intake      0 ml  Output   1300 ml  Net  -1300 ml     Vitals Filed Vitals:   04/06/15 1200 04/06/15 1431 04/06/15 2151 04/07/15 0531  BP: 153/90 147/71 172/86 180/92  Pulse: 91 109 89 98  Temp: 97.9 F (36.6 C) 100 F (37.8 C) 97.8 F (36.6 C) 98.3 F (36.8 C)  TempSrc: Oral Oral Oral Oral  Resp: 20 20 16 16   Height:      Weight:      SpO2:   100% 100%    Exam:  General:  Pt is alert, not in acute distress- cachecti with bitemporal is wasting c--seems unhappy complaining   HEENT: No icterus, pallor noted , No thrush, oral mucosa moist  Cardiovascular: regular rate and rhythm, S1/S2 No murmur  Respiratory: clear to auscultation bilaterally  Data Reviewed: Basic Metabolic Panel:  Recent Labs Lab 04/05/15 0101 04/05/15 0403  NA 136 137  K 4.7 4.7  CL 108 109  CO2 19* 21*  GLUCOSE 76 65  BUN 59* 61*  CREATININE 3.00* 3.01*  CALCIUM 7.8* 7.9*   Liver Function Tests: No results for input(s): AST, ALT, ALKPHOS, BILITOT, PROT, ALBUMIN in the last 168 hours. No results for input(s): LIPASE, AMYLASE in the last 168 hours. No results for input(s): AMMONIA in the last 168 hours. CBC:  Recent Labs Lab 04/04/15 2108 04/05/15 0130 04/05/15 0403 04/05/15 1511 04/06/15 0430 04/06/15 1125  WBC 38.5*  --  26.5* 18.0* 16.9* 17.0*  HGB 5.9* 7.4* 6.5* 7.8* 6.7* 6.8*  HCT 18.0* 21.8* 19.3* 23.3* 20.0* 20.2*  MCV 90.5  --  87.3 89.6 89.7 88.2  PLT 305  --  212 196 199 199   Cardiac Enzymes: No results for input(s): CKTOTAL, CKMB, CKMBINDEX, TROPONINI in the last 168 hours. BNP (last 3 results) No results for input(s): BNP in the last 8760 hours.  ProBNP (last 3 results) No results for input(s): PROBNP in the last 8760 hours.  CBG:  Recent  Labs Lab 04/04/15 2108  GLUCAP 120*    Recent Results (from the past 240 hour(s))  Surgical pcr screen     Status: None   Collection Time: 04/04/15  5:41 PM  Result Value Ref Range Status   MRSA, PCR NEGATIVE NEGATIVE Final   Staphylococcus aureus NEGATIVE NEGATIVE Final    Comment:        The Xpert SA Assay (FDA approved for NASAL specimens in patients over 62 years of age), is one component of a comprehensive surveillance program.  Test performance has been validated by [facility] for patients greater than or equal to 13 year old. It is not intended to diagnose infection nor to guide or monitor treatment.   Urine culture     Status: None   Collection Time: 04/04/15 11:39 PM  Result Value Ref Range Status   Specimen Description URINE, CATHETERIZED  Final   Special Requests NONE  Final   Culture   Final    NO GROWTH 1 DAY Performed at [facility]    Report Status 04/06/2015 FINAL  Final     Studies: No results found.  Scheduled Meds:  Scheduled Meds: . sodium chloride   Intravenous Once  . feeding supplement (ENSURE ENLIVE)  237 mL Oral BID BM  . finasteride  5 mg Oral Daily  . hydrALAZINE  25 mg Oral 4 times per day  . multivitamin with minerals  1 tablet Oral Daily  . pantoprazole  40 mg Oral BID  . sevelamer carbonate  800 mg Oral TID WC  . verapamil  120 mg Oral 3 times per day  . Vitamin D (Ergocalciferol)  50,000 Units Oral Q7 days   Continuous Infusions:    Time spent on care of this patient: 25 min  Pleas Koch, MD [facility] [phone]

## 2015-04-07 NOTE — Progress Notes (Signed)
Patient is now agreeable to return to [facility]. CSW confirmed with Broc at [facility] that they would be able to take patient back today. Discharge packet given to PTAR, RN - Susie aware.      Lincoln Maxin, LCSW [facility] Clinical Social Worker cell #: [phone]

## 2015-04-07 NOTE — Progress Notes (Signed)
Physical Therapy Treatment Patient Details Name: Lee Swanson MRN: [id number] DOB: 08-02-1951 Today's Date: 04/07/2015    History of Present Illness 64 y.o. male admitted from SNF with hematuria, s/p cystoscopy 04/04/15. Recent admission to [facility] 6/11-03/25/15 with SAH, subdural hematoma, CVA. PMH of medical noncompliance, HTN, tobacco/cocaine/EtOH use, CKD, hep C.     PT Comments    Pt progressing this visit but is still unsafe to transfer alone, he wants to be able to walk, concerned he is at risk for falls but may not be agreeable to SNF  Follow Up Recommendations  Home health PT;Supervision/Assistance - 24 hour;SNF (HHPT/OT if pt )     Equipment Recommendations  Wheelchair (measurements PT);Wheelchair cushion (measurements PT)    Recommendations for Other Services       Precautions / Restrictions Precautions Precautions: Fall Precaution Comments: pt reports he fell 2 weeks ago in hospital when he tried to get up to bathroom    Mobility  Bed Mobility Overal bed mobility: Needs Assistance Bed Mobility: Supine to Sit Rolling: Min assist         General bed mobility comments: min assist for trunk, HOB elevated, pt wants to pull on PT  Transfers Overall transfer level: Needs assistance Equipment used: 1 person hand held assist Transfers: Sit to/from [employer] Sit to Stand: Min assist Stand pivot transfers: Mod assist;Min assist       General transfer comment: assist to shift wt and come to full stand safely, requires assist for balance to complete turn and prevent fall as pt moves his body towards chair but requires cues and facilitation to move feet  Ambulation/Gait             General Gait Details: unsafe to amb, able to bear light wt on L knee before buckling   Stairs            Wheelchair Mobility    Modified Rankin (Stroke Patients Only)       Balance                                    Cognition  Arousal/Alertness: Awake/alert Behavior During Therapy: Impulsive Overall Cognitive Status: No family/caregiver present to determine baseline cognitive functioning                      Exercises      General Comments        Pertinent Vitals/Pain Pain Assessment: No/denies pain    Home Living                      Prior Function            PT Goals (current goals can now be found in the care plan section) Acute Rehab PT Goals Patient Stated Goal: to be able to get up to the bathroom PT Goal Formulation: With patient Time For Goal Achievement: 04/19/15 Potential to Achieve Goals: Fair Progress towards PT goals: Progressing toward goals    Frequency  Min 3X/week    PT Plan Current plan remains appropriate    Co-evaluation             End of Session Equipment Utilized During Treatment: Gait belt Activity Tolerance: Patient tolerated treatment well Patient left: with call bell/phone within reach;in bed;with chair alarm set     Time: 7829-5621 PT Time Calculation (min) (ACUTE ONLY): 12 min  Charges:  $Therapeutic Activity: 8-22 mins                    G Codes:      Lee Swanson 17-Apr-2015, 12:49 PM

## 2015-04-07 NOTE — Care Management Note (Signed)
Case Management Note  Patient Details  Name: Lee Swanson MRN: [id number] Date of Birth: 01-Jul-1951  Subjective/Objective:       Patient declines SNF, wants home w/HHC. [facility] chosen . Rep Baxter Hire aware of d/c & hhc orders.New pcp set up.Ambulance transp. [facility] dme-w/c to be delivered to home today within 4hrs.contact person Robynn Pane [phone] to [facility] aware of d/c plan.             Action/Plan:d/c home w/HHC.Ambulance transp. No further d/c needs.   Expected Discharge Date:                  Expected Discharge Plan:  Home w Home Health Services  In-House Referral:     Discharge planning Services  CM Consult  Post Acute Care Choice:    Choice offered to:     DME Arranged:  Wheelchair manual DME Agency:  [facility]  HH Arranged:  RN, PT, OT, Nurse's Aide HH Agency:  [facility]  Status of Service:  Completed, signed off  Medicare Important Message Given:  Yes-second notification given Date Medicare IM Given:    Medicare IM give by:    Date Additional Medicare IM Given:    Additional Medicare Important Message give by:     If discussed at Long Length of Stay Meetings, dates discussed:    Additional Comments:  Lanier Clam, RN 04/07/2015, 2:47 PM

## 2015-04-07 NOTE — Care Management Note (Signed)
Case Management Note  Patient Details  Name: Lee Swanson MRN: [id number] Date of Birth: Jul 25, 1951  Subjective/Objective:  Ambulance transp here w/concerns about d/c to patient's home. [facility] aware of holding off on d/c to home w/HHC.Patient does not understand HHC services-intermittent.Witnessed patient saying "the home care nurse will empty my foley." Also have contacted [facility] dme rep informed of holding off on w/c delivery.                  Action/Plan:   Expected Discharge Date:                  Expected Discharge Plan:  Home w Home Health Services  In-House Referral:     Discharge planning Services  CM Consult  Post Acute Care Choice:    Choice offered to:     DME Arranged:  Wheelchair manual DME Agency:  [facility]  HH Arranged:  RN, PT, OT, Nurse's Aide HH Agency:  [facility]  Status of Service:  Completed, signed off  Medicare Important Message Given:  Yes-second notification given Date Medicare IM Given:    Medicare IM give by:    Date Additional Medicare IM Given:    Additional Medicare Important Message give by:     If discussed at Long Length of Stay Meetings, dates discussed:    Additional Comments:  Lanier Clam, RN 04/07/2015, 4:05 PM

## 2015-04-07 NOTE — Progress Notes (Signed)
Pt has been educated by my self and the charge nurse, but refuses care. This includes complete assessment and medications. He states that he no longer wants to receive care and his wishes are to go home.

## 2015-04-07 NOTE — Care Management Important Message (Signed)
Important Message  Patient Details  Name: Lee Swanson MRN: [id number] Date of Birth: 1950-11-30   Medicare Important Message Given:  [facility] notification given    Haskell Flirt 04/07/2015, 11:33 AMImportant Message  Patient Details  Name: Lee Swanson MRN: [id number] Date of Birth: 1951/09/17   Medicare Important Message Given:  Yes-second notification given    Haskell Flirt 04/07/2015, 11:32 AM

## 2015-04-07 NOTE — Progress Notes (Signed)
Patient ID: Lee Swanson, male   DOB: 1951/02/07, 64 y.o.   MRN: [id number] 3 Days Post-Op  Subjective:  Mr. Swift has declined IV replacement and blood transfusion.  His Hgb was 6.8 last night but fortunately the urine remains clear and the urethral bleeding has resolved with the penile wrap.  He is without complaints today.  ROS:  Review of Systems  Constitutional: Negative for fever and chills.  Gastrointestinal: Negative for abdominal pain.  Genitourinary: Negative for hematuria.    Anti-infectives: Anti-infectives    Start     Dose/Rate Route Frequency Ordered Stop   04/04/15 1728  ceFAZolin (ANCEF) IVPB 2 g/50 mL premix     2 g 100 mL/hr over 30 Minutes Intravenous 30 min pre-op 04/04/15 1728 04/04/15 1925      Current Facility-Administered Medications  Medication Dose Route Frequency Provider Last Rate Last Dose  . 0.9 %  sodium chloride infusion   Intravenous Once [name], NP      . feeding supplement (ENSURE ENLIVE) (ENSURE ENLIVE) liquid 237 mL  237 mL Oral BID BM Renie Ora, RD   237 mL at 04/06/15 1822  . finasteride (PROSCAR) tablet 5 mg  5 mg Oral Daily [name], MD   5 mg at 04/06/15 1914  . food thickener (THICK IT) powder   Oral PRN [name], MD      . hydrALAZINE (APRESOLINE) injection 10 mg  10 mg Intravenous Q6H PRN [name], MD   10 mg at 04/06/15 0630  . hydrALAZINE (APRESOLINE) tablet 25 mg  25 mg Oral 4 times per day Rhetta Mura, MD   25 mg at 04/06/15 1811  . multivitamin with minerals tablet 1 tablet  1 tablet Oral Daily [name], MD   1 tablet at 04/05/15 0941  . ondansetron (ZOFRAN) tablet 4 mg  4 mg Oral Q6H PRN [name], MD       Or  . ondansetron (ZOFRAN) injection 4 mg  4 mg Intravenous Q6H PRN [name], MD      . pantoprazole (PROTONIX) EC tablet 40 mg  40 mg Oral BID [name], MD   40 mg at 04/06/15 0952  . sevelamer carbonate (RENVELA) tablet 800 mg  800 mg Oral TID WC [name], MD   800  mg at 04/06/15 7829  . verapamil (CALAN) tablet 120 mg  120 mg Oral 3 times per day [name], MD   120 mg at 04/06/15 1811  . Vitamin D (Ergocalciferol) (DRISDOL) capsule 50,000 Units  50,000 Units Oral Q7 days [name], MD   50,000 Units at 04/06/15 1203     Objective: Vital signs in last 24 hours: Temp:  [97.8 F (36.6 C)-100 F (37.8 C)] 98.3 F (36.8 C) (07/21 0531) Pulse Rate:  [89-109] 98 (07/21 0531) Resp:  [16-20] 16 (07/21 0531) BP: (147-180)/(71-92) 180/92 mmHg (07/21 0531) SpO2:  [100 %] 100 % (07/21 0531)  Intake/Output from previous day: 07/20 0701 - 07/21 0700 In: 120 [P.O.:120] Out: 1375 [Urine:1375] Intake/Output this shift:     Physical Exam  Constitutional:  Cachectic in NAD  Genitourinary:  Urine clear in foley bag.   No further urethral bleeding noted and the dressing was removed.   Vitals reviewed.   Lab Results:   Recent Labs  04/06/15 0430 04/06/15 1125  WBC 16.9* 17.0*  HGB 6.7* 6.8*  HCT 20.0* 20.2*  PLT 199 199   BMET  Recent Labs  04/05/15 0101 04/05/15 0403  NA 136 137  K 4.7 4.7  CL 108 109  CO2 19* 21*  GLUCOSE 76 65  BUN 59* 61*  CREATININE 3.00* 3.01*  CALCIUM 7.8* 7.9*   PT/INR  Recent Labs  04/05/15 0403  LABPROT 15.4*  INR 1.21   ABG No results for input(s): PHART, HCO3 in the last 72 hours.  Invalid input(s): PCO2, PO2  Studies/Results: No results found.  Recent labs and notes reviewed.  Assessment: s/p Procedure(s): CYSTOSCOPY WITH CLOT EVACUATION/ FULGERATION OF PROSTATE  His urine remains clear and the urethral bleeding has stopped.     Plan: He is ok for discharge from a urologic standpoint and will need his foley catheter changed on a monthly basis but should have his first exchange in my office in a month's time.      LOS: 3 days    Anner Crete 04/07/2015

## 2015-04-07 NOTE — Progress Notes (Signed)
CSW consulted for transportation needs. Patient will need non-emergency ambulance transport home. CSW confirmed home address with patient. CSW also provided patient with information on [facility] [employer] resources. Patient was deemed by psychiatrist to have capacity & states that he would prefer to go home with home health services rather than return to SNF [facility] where he was 7/8 - 04/04/15 for rehab under Letter of Guarantee).   [facility] called for transport.  No other CSW needs identified - CSW signing off.   Lincoln Maxin, LCSW [facility] Clinical Social Worker cell #: [phone]

## 2015-04-07 NOTE — Progress Notes (Addendum)
Shift note: At 7pm on 04/06/15, attending [facility]) called this NP to say that pt was refusing IV restart and blood.  RN paged this NP during night shift to say he was refusing care. This am, NP reviewed MAR and pt has refused all meds tonight. He is alert and oriented. Per attending note, plan is for psych consult today.  Will pass this along to attending at 0700. KJKG, NP [facility]

## 2015-04-07 NOTE — Consult Note (Addendum)
[name]   Reason for Consult:  Capacity evaluation and substance abuse Referring Physician:  Dr. Mahala Menghini Patient Identification: Lee Swanson MRN:  [id number] Principal Diagnosis: Adjustment disorder with mixed anxiety and depressed mood Diagnosis:   Patient Active Problem List   Diagnosis Date Noted  . Pressure ulcer [L89.90] 04/05/2015  . Hematuria, gross [R31.0] 04/04/2015  . Gross hematuria [R31.0] 04/04/2015  . Clot retention of urine [R33.8] 04/04/2015  . Atelectasis [J98.11]   . Bruit [R09.89]   . Encounter for feeding tube placement [Z87.898]   . Dysphagia [R13.10]   . Malignant hypertension [I10]   . 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 Ventilator associated pneumonia [J95.851]   . Aspiration pneumonia due to food (regurgitated) [J69.0]   . Acute on chronic kidney failure [N17.9, N18.9]   . Hypernatremia [E87.0]   . Anemia of chronic disease [D63.8]   . Tracheostomy status [Z93.0]   . Essential hypertension [I10]   . VAP (ventilator-associated pneumonia) [J95.851]   . Stroke with cerebral ischemia [I63.9]   . 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 Acute respiratory failure [J96.00]   . Pneumonia, organism unspecified [J18.9]   . Acute respiratory failure, unspecified whether with hypoxia or hypercapnia [J96.00]   . Endotracheally intubated [Z78.9]   . PRES (posterior reversible encephalopathy syndrome) [I67.83]   . Respiratory failure [J96.90] 02/26/2015  . SAH (subarachnoid hemorrhage) [I60.9] 02/26/2015  . Acute respiratory failure with hypoxemia [J96.01] 02/26/2015  . SDH (subdural hematoma) [I62.00] 02/26/2015  . Altered mental status [R41.82]   . Suicidal ideation [R45.851]   . Barrett's esophagus [K22.70] 02/05/2015  . Heme + stool [R19.5]   . SOB (shortness of breath) [R06.02]   . Alcohol-induced acute pancreatitis [K85.2]   . Hematemesis with nausea [K92.0, R11.0]   . Protein-calorie malnutrition, severe [E43] 01/25/2015   . Hypertensive emergency [I10] 01/24/2015  . Hypokalemia [E87.6] 01/24/2015  . AKI (acute kidney injury) [N17.9] 01/24/2015  . CKD (chronic kidney disease) stage 4, GFR 15-29 ml/min [N18.4] 01/24/2015  . Thrombocytopenia [D69.6] 01/24/2015  . Cocaine abuse [F14.10] 01/24/2015  . LVH (left ventricular hypertrophy) [I51.7] 01/24/2015  . Abdominal pain [R10.9] 01/24/2015  . Nausea and vomiting [R11.2] 01/24/2015  . Weight loss [R63.4] 01/24/2015  . Tobacco abuse [Z72.0] 01/24/2015  . Chest pain [R07.9] 01/24/2015  . Headache [R51] 01/24/2015  . Syncope [R55] 01/24/2015  . Elevated troponin [R79.89] 01/24/2015  . Enlarged prostate [N40.0] 01/24/2015    Total Time spent with patient: 1 hour  Subjective:   Lee Swanson is a 64 y.o. male patient admitted with gross hematuria.  HPI:  Lee Swanson is an 64 y.o. male seen face to face for psychiatric consultation and evaluation of capacity evaluation. Patient is moderately cooperative for this evaluation. Patient appeared sitting in a chair next to his bed. 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 He was briefly observed by psychiatry about two months ago when he had suicide thoughts. Patient has knowledge about being hospitalized at [facility] for bleeding and blood transfusion and slowly recovering his physical health. Patient states that he has been tired of staying in hospital and wants to go to boarding house where he has been staying for some time with three roommates. He complained that no body is helping to find this wallet with SSN.  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.He stated he doesn't know where his daughters are and he  is divorced.He has no previous mental health treatment.He denies current SI or self-harm, HI, AVH, and hs no evidence of psychosis. After participating in session for 30 minutes, he states that he has been feeling tired and does not want answer questions anymore.He has intact orientation, fair concentration and language functions. He has been making poor choices. He has poor insight and judgement about his future treatment plans and has no plans of quitting substance abuse especially cocaine.    Past Medical History:  Past Medical History  Diagnosis Date  . Chronic kidney disease     Stage IV  . Hypokalemia   . Thrombocytopenia   . Hypertension 01/2015.    Hypertensive emergency.  . Hepatitis C antibody test positive 01/25/2015    HIV testing negative on the same date.  . Elevated troponin I level 01/2015.    Felt to be secondary to demand ischemia in setting of hypertension and cocaine use. Cardiac cath not planned given kidney disease  . Vitamin D deficiency 01/2015.  . Barrett's esophagus 02/05/2015    EGD 01/2015 - anticipate repeat EGD 2019  . Stroke with cerebral ischemia   . SAH (subarachnoid hemorrhage) 02/26/2015  . PRES (posterior reversible encephalopathy syndrome)   . Embolic cerebral infarction   . Dysphagia   . Cocaine abuse 01/24/2015  . Barrett's esophagus 02/05/2015    EGD 01/2015 - anticipate repeat EGD 2019   . Anemia of chronic disease   . Enlarged prostate 01/24/2015  . Heme + stool     Past Surgical History  Procedure Laterality Date  . Esophagogastroduodenoscopy N/A 01/27/2015    Procedure: ESOPHAGOGASTRODUODENOSCOPY (EGD);  Surgeon: Iva Boop, MD;  Location: [facility] ENDOSCOPY;  Service: Endoscopy;  Laterality: N/A;  . Cystoscopy N/A 04/04/2015    Procedure: CYSTOSCOPY WITH CLOT EVACUATION/ FULGERATION OF PROSTATE;  Surgeon: [name], MD;  Location: [facility] ORS;  Service: Urology;  Laterality: N/A;   Family History: History reviewed. No pertinent family history. Social  History:  History  Alcohol Use  . Yes     History  Drug Use  . Yes  . Special: Cocaine    History   Social History  . Marital Status: Unknown    Spouse Name: N/A  . Number of Children: N/A  . Years of Education: N/A   Social History Main Topics  . Smoking status: Current Every Day Smoker -- 0.50 packs/day    Types: Cigarettes  . Smokeless tobacco: Never Used  . Alcohol Use: Yes  . Drug Use: Yes    Special: Cocaine  . Sexual Activity: Not on file   Other Topics Concern  . 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0 26.0 - 34.0 pg   MCHC 33.5 30.0 - 36.0 g/dL   RDW 40.9 (H) 81.1 - 91.4 %   Platelets 196 150 - 400 K/uL  CBC     Status: Abnormal   Collection Time: 04/06/15  4:30 AM  Result Value Ref Range   WBC 16.9 (H) 4.0 - 10.5 K/uL   RBC 2.23 (L) 4.22 - 5.81 MIL/uL   Hemoglobin 6.7 (LL) 13.0 - 17.0 g/dL    Comment: RESULT REPEATED AND VERIFIED CRITICAL RESULT CALLED TO, READ BACK BY AND VERIFIED WITH: S LAMB RN @ 0516 ON 04/06/15 BY C DAVIS    HCT 20.0 (L) 39.0 - 52.0 %   MCV 89.7 78.0 - 100.0 fL   MCH 30.0 26.0 - 34.0 pg   MCHC 33.5 30.0 - 36.0 g/dL   RDW 78.2 (H) 95.6 - 21.3 %   Platelets 199 150 -  400 K/uL  Prepare RBC     Status: None   Collection Time: 04/06/15  6:00 AM  Result Value Ref Range   Order Confirmation ORDER PROCESSED BY BLOOD BANK   CBC     Status: Abnormal   Collection Time: 04/06/15 11:25 AM  Result Value Ref Range   WBC 17.0 (H) 4.0 - 10.5 K/uL   RBC 2.29 (L) 4.22 - 5.81 MIL/uL   Hemoglobin 6.8 (LL) 13.0 - 17.0 g/dL    Comment: REPEATED TO VERIFY CRITICAL VALUE NOTED.  VALUE IS CONSISTENT WITH PREVIOUSLY REPORTED AND CALLED VALUE.    HCT 20.2 (L) 39.0 - 52.0 %   MCV 88.2 78.0 - 100.0 fL   MCH 29.7 26.0 - 34.0 pg   MCHC 33.7 30.0 - 36.0 g/dL   RDW 08.6 (H) 57.8 - 46.9 %   Platelets 199 150 - 400 K/uL    Vitals: Blood pressure 180/92, pulse 98, temperature 98.3 F (36.8 C), temperature source Oral, resp. rate 16, height 5\' 6"  (1.676 m), weight 44.5 kg (98 lb 1.7 oz), SpO2 100 %.  Risk to Self: Is patient at risk for suicide?: No Risk to Others:   Prior Inpatient Therapy:   Prior Outpatient Therapy:    Current Facility-Administered Medications  Medication Dose Route Frequency Provider Last Rate Last Dose  . 0.9 %  sodium chloride infusion   Intravenous Once [name], NP      . feeding supplement (ENSURE ENLIVE) (ENSURE ENLIVE) liquid 237 mL  237 mL Oral BID BM Renie Ora, RD   237 mL at 04/06/15 1822  . finasteride (PROSCAR) tablet 5 mg  5 mg Oral Daily [name], MD   5 mg at 04/06/15 6295  . food thickener (THICK IT) powder   Oral PRN [name], MD      . hydrALAZINE (APRESOLINE) injection 10 mg  10 mg Intravenous Q6H PRN [name], MD   10 mg at 04/06/15 0630  . hydrALAZINE (APRESOLINE) tablet 25 mg  25 mg Oral 4 times  per day Rhetta Mura, MD   25 mg at 04/07/15 0753  . multivitamin with minerals tablet 1 tablet  1 tablet Oral Daily [name], MD   1 tablet at 04/05/15 0941  . ondansetron (ZOFRAN) tablet 4 mg  4 mg Oral Q6H PRN [name], MD       Or  . ondansetron (ZOFRAN) injection 4 mg  4 mg Intravenous Q6H PRN  [name], MD      . pantoprazole (PROTONIX) EC tablet 40 mg  40 mg Oral BID [name], MD   40 mg at 04/06/15 0952  . sevelamer carbonate (RENVELA) tablet 800 mg  800 mg Oral TID WC [name], MD   800 mg at 04/06/15 1610  . verapamil (CALAN) tablet 120 mg  120 mg Oral 3 times per day [name], MD   120 mg at 04/06/15 1811  . Vitamin D (Ergocalciferol) (DRISDOL) capsule 50,000 Units  50,000 Units Oral Q7 days [name], MD   50,000 Units at 04/06/15 1203    Musculoskeletal: Strength & Muscle Tone: decreased Gait & Station: unable to stand Patient leans: N/A  Psychiatric Specialty Exam: Physical Exam  ROS stomach pain, hematuria, irritability, generalized weakness and denied nausea, vomiting, SOB and chest pain  Blood pressure 180/92, pulse 98, temperature 98.3 F (36.8 C), temperature source Oral, resp. rate 16, height  (1.676 m), weight 44.5 kg (98 lb 1.7 oz), SpO2 100 %.Body mass index is 15.84 kg/(m^2).  General Appearance: Disheveled and Guarded  Eye Contact::  Fair  Speech:  Slurred due to history of tracheostomy  Volume:  Normal  Mood:  Irritable  Affect:  Constricted and Depressed  Thought Process:  Coherent and Goal Directed  Orientation:  Full (Time, Place, and Person)  Thought Content:  WDL  Suicidal Thoughts:  No  Homicidal Thoughts:  No  Memory:  Immediate;   Good Recent;   Good  Judgement:  Impaired  Insight:  Shallow  Psychomotor Activity:  Decreased  Concentration:  Fair  Recall:  Good  Fund of Knowledge:Fair  Language: Good  Akathisia:  Negative  Handed:  Right  AIMS (if indicated):     Assets:  Communication Skills Housing Leisure Time Resilience  ADL's:  Impaired  Cognition: WNL  Sleep:      Medical Decision Making: New problem, with additional work up planned, Review of Psycho-Social Stressors (1), Review or order clinical lab tests (1), Established Problem, Worsening (2), Review of Last Therapy Session (1), Review or order medicine  tests (1), Review of Medication Regimen & Side Effects (2) and Review of New Medication or Change in Dosage (2)  Treatment Plan Summary: Patient has been suffering with substance abuse, multiple medical problems, psychosocial problems and has intact cognitions. He has poor insight, judgment and known to make poor choices.  Daily contact with patient to assess and evaluate symptoms and progress in treatment and Medication management  Plan:  Patient has intact cognition and capacity to make his own medical decisions and living arrangements.  Patient does not meet criteria for psychiatric inpatient admission. Supportive therapy provided about ongoing stressors.  Appreciate psychiatric consultation Please contact [phone] or [phone] if needs further assistance   Disposition: Patient benefit from out of home placement ALF or SNFdue to needs 24 hours supportive care.  Jacole Capley,JANARDHAHA R. 04/07/2015 12:16 PM

## 2015-04-07 NOTE — Assessment & Plan Note (Signed)
The urethral bleeding has stopped and he has no hematuria.

## 2015-04-07 NOTE — Discharge Summary (Signed)
Physician Discharge Summary  Lee Swanson NFA:[id number] DOB: 02/22/1951 DOA: 04/04/2015  PCP: No primary care provider on file.  Admit date: 04/04/2015 Discharge date: 04/07/2015  Time spent: 45 minutes  Recommendations for Outpatient Follow-up:  1. Recommend goals of care as an outpatient 2. Patient is at very high risk for readmission to the hospital given noncompliance and substance abuse issues affecting his medical judgment making however psychiatry thinks he has capacity and hence he was discharged home 3. If he allows he will need labs in about one week 4. He should follow-up with urologist as an outpatient  5. The patient was recommended skilled nursing care as he had poor ambulation however patient declined the same and will be going back to his group home  Discharge Diagnoses:  Principal Problem:   Hematuria, gross Active Problems:   CKD (chronic kidney disease) stage 4, GFR 15-29 ml/min   LVH (left ventricular hypertrophy)   Essential hypertension   Gross hematuria   Clot retention of urine   Pressure ulcer   Discharge Condition: poor and guarded with high risk for readmission secondary to poor judgment about medical risks  Diet recommendation: Heart healthy low-salt  Filed Weights   04/04/15 2230  Weight: 44.5 kg (98 lb 1.7 oz)    History of present illness:  64 y/o ? recent [facility] hosp stay 5/9--> 5/14 after a 1 month stay to rehab. He was hospitalized for hypertensive emergency, cocaine use, Pres syndrome, Small SAH, multifocal infarcts and upper GI bleed 5/10 He was discharged and then readmitted 02/26/15 found to be extremely hypertensive blood pressure 250/155 and intubated and started on Nipride and was cocaine positive on admission--he was diagnosed at that admission with PRES syndrome and an acute CVA with multiple infarcts in the right frontal left parietal right thalamus and right cerebellum and TEE was not performed secondary to prior dysphagia  strictures He was kept on a dysphagia diet CT and gram neck was also held because of worsening renal function Had VDRF as well as ventilator associated pneumonia trach remov at that pointed just prior to discharge.   Readmitted 7/18 from nursing home with gross hematuria starting on the morning of admission.  Hb 7.3- was 7,7 when discharged.  Urologist was consulted and patient underwent fulguration of the bladder neck bleeders as well as clot evacuation   Hospital Course:   Hematuria/ bladder outlet obstruction due to clots + Long urethral tear anterior bulb on cystoscopy - evaluated by urology, Dr. Annabell Howells who feels this is likely secondary to his severely enlarged prostate-prostate measures close to 6-7 cm on CT scan -hold aspirin indefinitely from here on out - taken to OR on evening of 7/18- clots evacuated- bleeders in neck of bladder fulgurated - started on CBI which has been discontinued subsequently - blood tinged urine in foley bag- started on Finasteride on discharge - patient states he does not have a urologist-will need indwelling Foley on discharge and review in the outpatient setting by Dr. Annabell Howells - Urine culture from 7/18 NG  Psychosocial stressors affecting medical care -Patient is estranged from his daughter -He lives at a group home and has done so since he moved here from [place] -He refused care on 7/20 and 7/21 and did not want a PICC line placed nor further blood transfusions and said he was "tired" -Psychiatry saw the patient determined he had capacity to make medical decisions but he was not making good decisions. -It is likely his drug abuse affects his  capacity to think rationally regarding medical care as I believe he may want to go home and indulge in these habits -As he has capacity we cannot force him to stay in the hospital against his will We will optimize him with home health OT, PT, RN, social work in the event that he may need further services course  transition to skilled care  Anemia -last anemia panel consistent with anemia of chronic disease -Hemoglobin 5.9 on admission -Chronic anemia with hemoglobin of 7.7 when discharged, this was 7.3 when in ER at [facility]- given 1 U PRBC 7/18 -given 200 mcg of procrit 7/19-Transfused a7/18, 7/19 and gain overnight 7/19 X14 hemoglobin 6.17 -Refusing all transfusions and blood draws and refusing care 7/20 and 7/21 making discussion about his care difficult -He however is hemodynamically stable and has capacity to refuse care   CKD (chronic kidney disease) stage 4, GFR 15-29 ml/min -Creatinine actually slightly improved from when he was discharged when it was 4.59 - continues to improve with IV fluid repletion and transfusion    LVH (left ventricular hypertrophy)/ Essential hypertension/ PRES last admission -resumed verapamil 120 mg every 8 hourly  -Add scheduled hydralazine - d/c IVF- follow BP   Barrett's esophagus -Resumed PPI -Outpatient screening needed but   Dysphagia -disharged on a honey thick liquid diet last time - have resumed this  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 - cont supplements  Recent tracheotomy  Cocaine abuse -Hesitate to use beta blocker as he may resume   Severe malnutrition  Consultations: Psychiatry Urology   Discharge Exam: Filed Vitals:   04/07/15 0531  BP: 180/92  Pulse: 98  Temp: 98.3 F (36.8 C)  Resp: 16    General: Alert oriented  Edentulous Bitemporal wasting, supraclavicular wasting Cardiovascular: S1-S2 no murmur rub or gallop  Respiratory: Clinically clear no added sound  Discharge Instructions   Discharge Instructions    Diet - low sodium heart healthy    Complete by:  As  directed      Discharge instructions    Complete by:  As directed   Needs meds     Increase activity slowly    Complete by:  As directed           Current Discharge Medication List    START taking these medications   Details  finasteride (PROSCAR) 5 MG tablet Take 1 tablet (5 mg total) by mouth daily. Qty: 30 tablet, Refills: 0    food thickener (THICK IT) POWD Take 1 g by mouth as needed (dysphagia). Qty: 227 g, Refills: 0      CONTINUE these medications which have CHANGED   Details  verapamil (CALAN) 120 MG tablet Take 1 tablet (120 mg total) by mouth every 8 (eight) hours. Qty: 90 tablet, Refills: 2      CONTINUE these medications which have NOT CHANGED   Details  Multiple Vitamin (MULTIVITAMIN WITH MINERALS) TABS tablet Take 1 tablet by mouth daily.    pantoprazole (PROTONIX) 40 MG tablet Take 1 tablet (40 mg total) by mouth 2 (two) times daily. Qty: 30 tablet, Refills: 3    sevelamer carbonate (RENVELA) 800 MG tablet Take 1 tablet (800 mg total) by mouth 3 (three) times daily with meals. Qty: 90 tablet, Refills:  3      STOP taking these medications     aspirin EC 81 MG EC tablet      Vitamin D, Ergocalciferol, (DRISDOL) 50000 UNITS CAPS capsule        No Known Allergies Follow-up Information    Call Anner Crete, MD.   Specialty:  Urology   Why:  He needs f/u in our office in 1 month.  Please call to arrange.    Contact information:   [address] [place] [address] [phone]        The results of significant diagnostics from this hospitalization (including imaging, microbiology, ancillary and laboratory) are listed below for reference.    Significant Diagnostic Studies: Dg Abd 1 View  03/20/2015   CLINICAL DATA:  Nasogastric tube placement.  Initial encounter.  EXAM: ABDOMEN - 1 VIEW  COMPARISON:  Abdominal radiograph performed 03/16/2015  FINDINGS: The patient's enteric tube is seen ending overlying the body of the stomach.  The visualized  bowel gas pattern is grossly unremarkable, with scattered stool and air noted in the colon, and scattered air-filled loops of small bowel. There is no evidence of bowel dilatation to suggest obstruction. No free intra-abdominal air is seen, though evaluation for free air is limited on a single supine view. Minimal residual contrast is seen within colonic diverticula and at the sigmoid colon.  No acute osseous abnormalities are identified. The visualized lung bases are grossly clear.  IMPRESSION: Enteric tube seen ending overlying the body of the stomach.   Electronically Signed   By: Roanna Raider M.D.   On: 03/20/2015 23:12   Dg Chest Port 1 View  03/17/2015   CLINICAL DATA:  Respiratory failure, chronic renal insufficiency  EXAM: PORTABLE CHEST - 1 VIEW  COMPARISON:  Portable chest x-ray of March 14, 2015  FINDINGS: The lungs are reasonably well inflated. There is no focal infiltrate. The infrahilar lung markings remain mildly prominent bilaterally. A tracheostomy appliance is in place with the tip lying at the level of the inferior margin clavicular heads. The feeding tube tip projects below the inferior margin of the image. The cardiac silhouette is top-normal in size. The pulmonary vascularity is normal. The mediastinum is normal in width. The bony thorax is unremarkable.  IMPRESSION: Minimal stable infrahilar interstitial prominence may reflect subsegmental atelectasis. There has been marked improvement since the June 22nd study with no significant change since yesterday's study.   Electronically Signed   By: David  [place] M.D.   On: 03/17/2015 08:00   Dg Chest Port 1 View  03/14/2015   CLINICAL DATA:  Follow-up of atelectasis, history of acute and chronic renal insufficiency, respiratory failure and hypoxia, subdural hematoma.  EXAM: PORTABLE CHEST - 1 VIEW  COMPARISON:  Portable chest x-ray of March 09, 2015  FINDINGS: The lungs are adequately inflated. Previously demonstrated pleural effusions are no  longer evident. There is no pneumothorax or alveolar infiltrate. The heart and pulmonary vascularity are normal. The endotracheal tube tip lies approximately 1 cm below the inferior margin of the clavicular heads. The bony structures are unremarkable.  IMPRESSION: Improved appearance of both lungs. No acute cardiopulmonary abnormality is observed.   Electronically Signed   By: David  [place] M.D.   On: 03/14/2015 07:40   Dg Chest Port 1 View  03/09/2015   CLINICAL DATA:  Tracheostomy placement  EXAM: PORTABLE CHEST - 1 VIEW  COMPARISON:  March 07, 2015  FINDINGS: Tracheostomy tube tip is 4.3 cm above the carina. Port-A-Cath tip  is in the superior vena cava. Nasogastric tube is no longer present. No pneumothorax.  There is persistent consolidation in the left lower lobe. There are bilateral effusions. Heart is enlarged with pulmonary vascularity within normal limits.  IMPRESSION: Tube and catheter positions as described without pneumothorax. Persistent bilateral pleural effusions. Persistent airspace consolidation left lower lobe. Stable cardiomegaly. Suspect a degree of underlying congestive heart failure.   Electronically Signed   By: Bretta Bang III M.D.   On: 03/09/2015 14:50   Dg Abd Portable 1v  03/16/2015   CLINICAL DATA:  Feeding tube placement.  EXAM: PORTABLE ABDOMEN - 1 VIEW  COMPARISON:  03/16/2015.  FINDINGS: Feeding tube terminates in the gastric antrum. Retained oral contrast is seen in the transverse colon.  IMPRESSION: Feeding tube terminates in the gastric antrum.   Electronically Signed   By: Leanna Battles M.D.   On: 03/16/2015 14:17   Dg Abd Portable 1v  03/16/2015   CLINICAL DATA:  Evaluate feeding tube placement.  EXAM: PORTABLE ABDOMEN - 1 VIEW  COMPARISON:  03/15/2015  FINDINGS: Feeding tube is coiled in the region of the stomach. There is oral contrast in the transverse colon. Nonobstructive bowel gas pattern.  IMPRESSION: Feeding tube is coiled in the stomach.   Electronically  Signed   By: Richarda Overlie M.D.   On: 03/16/2015 10:09   Dg Abd Portable 1v  03/16/2015   CLINICAL DATA:  Nasogastric tube placement  EXAM: PORTABLE ABDOMEN - 1 VIEW  COMPARISON:  None.  FINDINGS: The nasogastric tube tip is in the region of the proximal gastric body. The side port is at the expected location of the EG junction. The tube should be advanced 5-10 cm for optimal placement.  IMPRESSION: NG tube just reaches the stomach, with side-port in the region of the EG junction. Recommend advancement 5-10 cm. These results were called by telephone at the time of interpretation on 03/16/2015 at 3:14 am to nurse Herbert Seta, who verbally acknowledged these results.   Electronically Signed   By: Ellery Plunk M.D.   On: 03/16/2015 03:14   Dg Abd Portable 1v  03/09/2015   CLINICAL DATA:  Nasogastric tube placement  EXAM: PORTABLE ABDOMEN - 1 VIEW  COMPARISON:  March 04, 2015  FINDINGS: Nasogastric tube tip and side port are in the stomach. There is no bowel dilatation or air-fluid level suggesting obstruction. No free air. There is consolidation in the left lower lobe.  IMPRESSION: Nasogastric tube tip and side port in stomach. Bowel gas pattern unremarkable. Left lower lobe airspace consolidation.   Electronically Signed   By: Bretta Bang III M.D.   On: 03/09/2015 17:23   Dg Swallowing Func-speech Pathology  03/22/2015    Objective Swallowing Evaluation:    Patient Details  Name: Lee Swanson MRN: [id number] Date of Birth: 1951-03-18  Today's Date: 03/22/2015 Time: SLP Start Time (ACUTE ONLY): 1330-SLP Stop Time (ACUTE ONLY): 1400 SLP Time Calculation (min) (ACUTE ONLY): 30 min  Past Medical History:  Past Medical History  Diagnosis Date  . Chronic kidney disease     Stage IV  . Hypokalemia   . Thrombocytopenia   . Hypertension 01/2015.    Hypertensive emergency.  . Hepatitis C antibody test positive 01/25/2015    HIV testing negative on the same date.  . Elevated troponin I level 01/2015.    Felt to be  secondary to demand ischemia in setting of hypertension and  cocaine use. Cardiac cath not planned given kidney disease  .  Vitamin D deficiency 01/2015.  . Barrett's esophagus 02/05/2015    EGD 01/2015 - anticipate repeat EGD 2019   Past Surgical History:  Past Surgical History  Procedure Laterality Date  . Esophagogastroduodenoscopy N/A 01/27/2015    Procedure: ESOPHAGOGASTRODUODENOSCOPY (EGD);  Surgeon: Iva Boop,  MD;  Location: [facility] ENDOSCOPY;  Service: Endoscopy;  Laterality: N/A;   HPI:  Other Pertinent Information: 64 yr old with hx of substance abuse,  medical non compliance, HTN. CRD, suicidal Ideation admitted after being  found down for unknown time, extremely hypertensive 250/155 and intubated.  Recieved trach 6/22, trach collar 6/23. Per MD note pt cocaine + on admit.  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 Repeat MRI progressive infarcts with new infarcts  posterior right frontal, left parietal regin, right thalamus and bilateral  cerebellum.  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 Acute left perimesencephalic  subarachnoid hemorrhage.  No Data Recorded  Assessment / Plan / Recommendation CHL IP CLINICAL IMPRESSIONS 03/22/2015  Therapy Diagnosis Mild pharyngeal phase dysphagia;Mild oral phase  dysphagia  Clinical Impression Pt presents with improved swallow function since MBS  6/28, such that a modified oral diet may be started.  Pt now with #4  cuffless trach; he used PMV during the assessment.  He had difficulty  masticating mechanical solids, which led to piecemeal bolusing and  unchewed bolus particles residing in pharynx.  Purees were tolerated with  excellent clearance through pharyngeal space.  Thin liquids were  immediately aspirated after  spilling prematurely into the pyriforms;  nectar-thick liquids were also intermittently aspirated.  Aspiration did  not consistently elicit a cough response.  Honey thick liquids were   consumed over multiple trials with no penetration/aspiration.  Recommend  initiating a dysphagia 1 diet with honey-thick liquids; crush meds in  puree; D/C NG.  PEG no longer indicated.  SLP will follow for  toleration/diet advancement.       CHL IP TREATMENT RECOMMENDATION 03/22/2015  Treatment Recommendations Therapy as outlined in treatment plan below     CHL IP DIET RECOMMENDATION 03/22/2015  SLP Diet Recommendations Dysphagia 1 (Puree)  Liquid Administration via (None)  Medication Administration Crushed with puree  Compensations Slow rate;Small sips/bites  Postural Changes and/or Swallow Maneuvers (None)     CHL IP OTHER RECOMMENDATIONS 03/22/2015  Recommended Consults (None)  Oral Care Recommendations Oral care BID  Other Recommendations Order thickener from pharmacy     CHL IP FOLLOW UP RECOMMENDATIONS 03/22/2015  Follow up Recommendations Skilled Nursing facility     [facility] IP FREQUENCY AND DURATION 03/11/2015  Speech Therapy Frequency (ACUTE ONLY) min 2x/week  Treatment Duration (None)         SLP Swallow Goals No flowsheet data found.  No flowsheet data found.              No flowsheet data found.  No flowsheet data found.         Blenda Mounts Laurice 03/22/2015, 2:16 PM    Dg Swallowing Func-speech Pathology  03/15/2015    Objective Swallowing Evaluation:    Patient Details  Name: Lee Swanson MRN: [id number] Date of Birth: [birth year]  Today's Date: 03/15/2015 Time: SLP Start Time (ACUTE ONLY): 1330-SLP Stop Time (ACUTE ONLY): 1545 SLP Time Calculation (min) (ACUTE ONLY): 135 min  Past Medical History:  Past  Medical History  Diagnosis Date  . Chronic kidney disease     Stage IV  . Hypokalemia   . Thrombocytopenia   . Hypertension 01/2015.    Hypertensive emergency.  . Hepatitis C antibody test positive 01/25/2015    HIV testing  negative on the same date.  . Elevated troponin I level 01/2015.    Felt to be secondary to demand ischemia in setting of hypertension and  cocaine use. Cardiac cath not planned given kidney disease  . Vitamin D deficiency 01/2015.  . Barrett's esophagus 02/05/2015    EGD 01/2015 - anticipate repeat EGD 2019   Past Surgical History:  Past Surgical History  Procedure Laterality Date  . Esophagogastroduodenoscopy N/A 01/27/2015    Procedure: ESOPHAGOGASTRODUODENOSCOPY (EGD);  Surgeon: Iva Boop,  MD;  Location: [facility] ENDOSCOPY;  Service: Endoscopy;  Laterality: N/A;   HPI:  Other Pertinent Information: 64 yr old with hx of substance abuse,  medical non compliance, HTN. CRD, suicidal Ideation admitted after being  found down for unknown time, extremely hypertensive 250/155 and intubated.  Recieved trach 6/22, trach collar 6/23. Per MD note pt cocaine + on admit.  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 Repeat MRI progressive infarcts with new infarcts  posterior right frontal, left parietal regin, right thalamus and bilateral  cerebellum.  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 Acute left perimesencephalic  subarachnoid hemorrhage.  No Data Recorded  Assessment / Plan / Recommendation CHL IP CLINICAL IMPRESSIONS 03/15/2015  Therapy Diagnosis (None)  Clinical Impression Pt demosntrates oropharyngeal dysphagia with severe  weakness and decreased negative pressure. The oropharyngeal mechanism is  atypical and pt is unable to protect his airway due to lack of redirection  of air to the upper airway for cough or sensation. Pt is not safe for PO  intake at the time. He will need a smaller trach prior to diet tolerance.  SLP will continue to follow.       CHL IP TREATMENT  RECOMMENDATION 03/15/2015  Treatment Recommendations F/U MBS in --- days (Comment)     CHL IP DIET RECOMMENDATION 03/15/2015  SLP Diet Recommendations NPO  Liquid Administration via (None)  Medication Administration (None)  Compensations (None)  Postural Changes and/or Swallow Maneuvers (None)     CHL IP OTHER RECOMMENDATIONS 03/15/2015  Recommended Consults (None)  Oral Care Recommendations Oral care QID  Other Recommendations (None)     CHL IP FOLLOW UP RECOMMENDATIONS 03/14/2015  Follow up Recommendations Skilled Nursing facility     [facility] IP FREQUENCY AND DURATION 03/11/2015  Speech Therapy Frequency (ACUTE ONLY) min 2x/week  Treatment Duration (None)     Pertinent Vitals/Pain NA    SLP Swallow Goals No flowsheet data found.  No flowsheet data found.    CHL IP REASON FOR REFERRAL 03/15/2015  Reason for Referral Objectively evaluate swallowing function     CHL IP ORAL PHASE 03/15/2015  Lips (None)  Tongue (None)  Mucous membranes (None)  Nutritional status (None)  Other (None)  Oxygen therapy (None)  Oral Phase Impaired  Oral - Pudding Teaspoon (None)  Oral - Pudding Cup (None)  Oral - Honey Teaspoon (None)  Oral - Honey Cup (None)  Oral - Honey Syringe (None)  Oral - Nectar Teaspoon (None)  Oral - Nectar Cup (None)  Oral - Nectar  Straw (None)  Oral - Nectar Syringe (None)  Oral - Ice Chips (None)  Oral - Thin Teaspoon (None)  Oral - Thin Cup (None)  Oral - Thin Straw (None)  Oral - Thin Syringe (None)  Oral - Puree (None)  Oral - Mechanical Soft (None)  Oral - Regular (None)  Oral - Multi-consistency (None)  Oral - Pill (None)  Oral Phase - Comment (None)      CHL IP PHARYNGEAL PHASE 03/15/2015  Pharyngeal Phase Impaired  Pharyngeal - Pudding Teaspoon (None)  Penetration/Aspiration details (pudding teaspoon) (None)  Pharyngeal - Pudding Cup (None)  Penetration/Aspiration details (pudding cup) (None)  Pharyngeal - Honey Teaspoon (None)  Penetration/Aspiration details (honey teaspoon) (None)  Pharyngeal - Honey Cup (None)   Penetration/Aspiration details (honey cup) (None)  Pharyngeal - Honey Syringe (None)  Penetration/Aspiration details (honey syringe) (None)  Pharyngeal - Nectar Teaspoon (None)  Penetration/Aspiration details (nectar teaspoon) (None)  Pharyngeal - Nectar Cup (None)  Penetration/Aspiration details (nectar cup) (None)  Pharyngeal - Nectar Straw (None)  Penetration/Aspiration details (nectar straw) (None)  Pharyngeal - Nectar Syringe (None)  Penetration/Aspiration details (nectar syringe) (None)  Pharyngeal - Ice Chips (None)  Penetration/Aspiration details (ice chips) (None)  Pharyngeal - Thin Teaspoon (None)  Penetration/Aspiration details (thin teaspoon) (None)  Pharyngeal - Thin Cup (None)  Penetration/Aspiration details (thin cup) (None)  Pharyngeal - Thin Straw (None)  Penetration/Aspiration details (thin straw) (None)  Pharyngeal - Thin Syringe (None)  Penetration/Aspiration details (thin syringe') (None)  Pharyngeal - Puree (None)  Penetration/Aspiration details (puree) (None)  Pharyngeal - Mechanical Soft (None)  Penetration/Aspiration details (mechanical soft) (None)  Pharyngeal - Regular (None)  Penetration/Aspiration details (regular) (None)  Pharyngeal - Multi-consistency (None)  Penetration/Aspiration details (multi-consistency) (None)  Pharyngeal - Pill (None)  Penetration/Aspiration details (pill) (None)  Pharyngeal Comment (None)      No flowsheet data found.  No flowsheet data found.        Harlon Ditty, MA CCC-SLP [phone]  [place], [name] 03/15/2015, 3:21 PM     Microbiology: Recent Results (from the past 240 hour(s))  Surgical pcr screen     Status: None   Collection Time: 04/04/15  5:41 PM  Result Value Ref Range Status   MRSA, PCR NEGATIVE NEGATIVE Final   Staphylococcus aureus NEGATIVE NEGATIVE Final    Comment:        The Xpert SA Assay (FDA approved for NASAL specimens in patients over 41 years of age), is one component of a comprehensive surveillance program.  Test  performance has been validated by [facility] for patients greater than or equal to 56 year old. It is not intended to diagnose infection nor to guide or monitor treatment.   Urine culture     Status: None   Collection Time: 04/04/15 11:39 PM  Result Value Ref Range Status   Specimen Description URINE, CATHETERIZED  Final   Special Requests NONE  Final   Culture   Final    NO GROWTH 1 DAY Performed at [facility]    Report Status 04/06/2015 FINAL  Final     Labs: Basic Metabolic Panel:  Recent Labs Lab 04/05/15 0101 04/05/15 0403  NA 136 137  K 4.7 4.7  CL 108 109  CO2 19* 21*  GLUCOSE 76 65  BUN 59* 61*  CREATININE 3.00* 3.01*  CALCIUM 7.8* 7.9*   Liver Function Tests: No results for input(s): AST, ALT, ALKPHOS, BILITOT, PROT, ALBUMIN in the last 168 hours. No results for input(s): LIPASE, AMYLASE in  the last 168 hours. No results for input(s): AMMONIA in the last 168 hours. CBC:  Recent Labs Lab 04/04/15 2108 04/05/15 0130 04/05/15 0403 04/05/15 1511 04/06/15 0430 04/06/15 1125  WBC 38.5*  --  26.5* 18.0* 16.9* 17.0*  HGB 5.9* 7.4* 6.5* 7.8* 6.7* 6.8*  HCT 18.0* 21.8* 19.3* 23.3* 20.0* 20.2*  MCV 90.5  --  87.3 89.6 89.7 88.2  PLT 305  --  212 196 199 199   Cardiac Enzymes: No results for input(s): CKTOTAL, CKMB, CKMBINDEX, TROPONINI in the last 168 hours. BNP: BNP (last 3 results) No results for input(s): BNP in the last 8760 hours.  ProBNP (last 3 results) No results for input(s): PROBNP in the last 8760 hours.  CBG:  Recent Labs Lab 04/04/15 2108  GLUCAP 120*       SignedRhetta Mura  [facility] 04/07/2015, 1:36 PM

## 2015-04-08 LAB — TYPE AND SCREEN
ABO/RH(D): B POS
Antibody Screen: NEGATIVE
UNIT DIVISION: 0
UNIT DIVISION: 0
UNIT DIVISION: 0
UNIT DIVISION: 0

## 2015-07-19 DEATH — deceased

## 2016-12-04 IMAGING — CT CT HEAD W/O CM
2 series · 15 of 30 positions shown, 19 images · non-contrast
Comparison: None.

CLINICAL DATA: Altered mental status. Blurred vision. Hypertension.

EXAM:
CT HEAD WITHOUT CONTRAST
TECHNIQUE: Contiguous axial images were obtained from the base of the skull
through the vertex without intravenous contrast.

[Series 201: head w/o, idose (1) · axial · non-contrast · 0.49mm/px · z∈[+64,+194]mm · 13 of 32 slices shown, 17 images]
[im 3/32  brain]
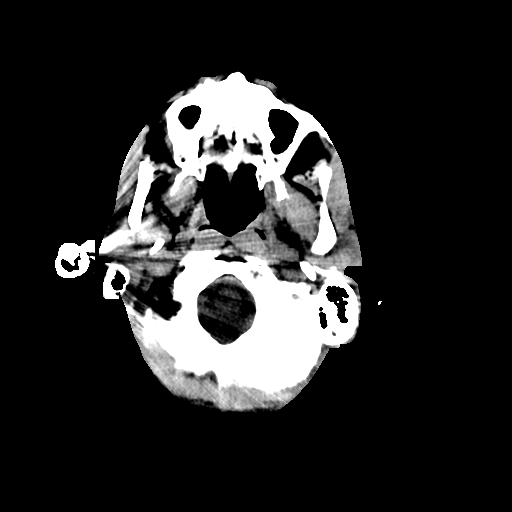
[im 3/32  bone]
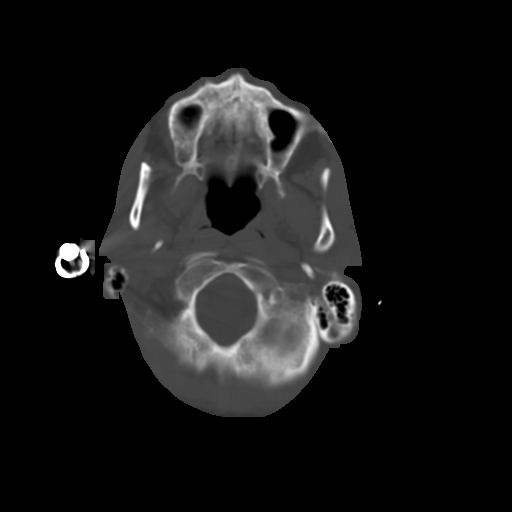
[im 5/32  brain]
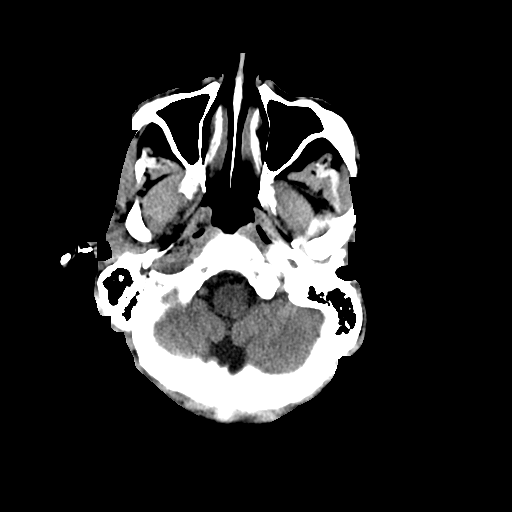
[im 7/32  brain]
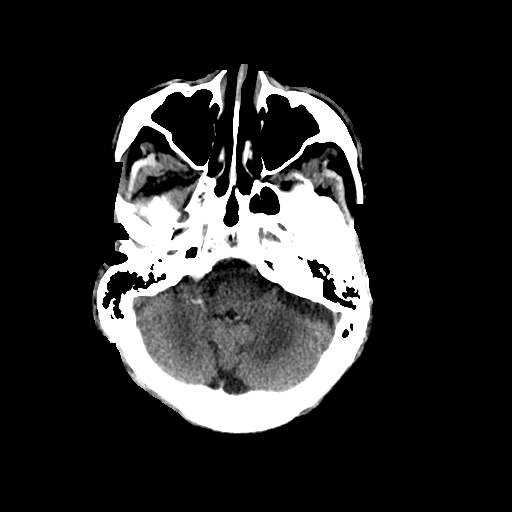
[im 9/32  brain]
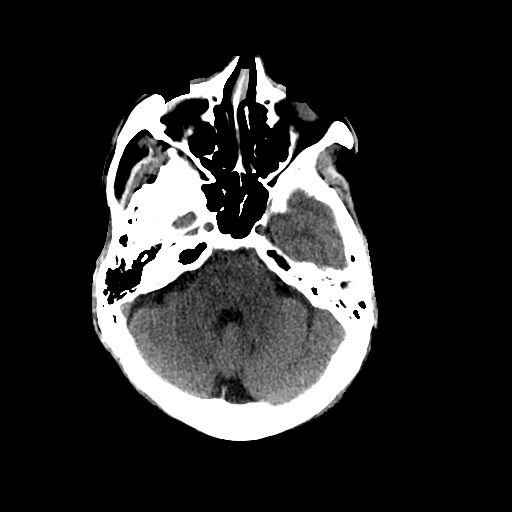
[im 12/32  brain]
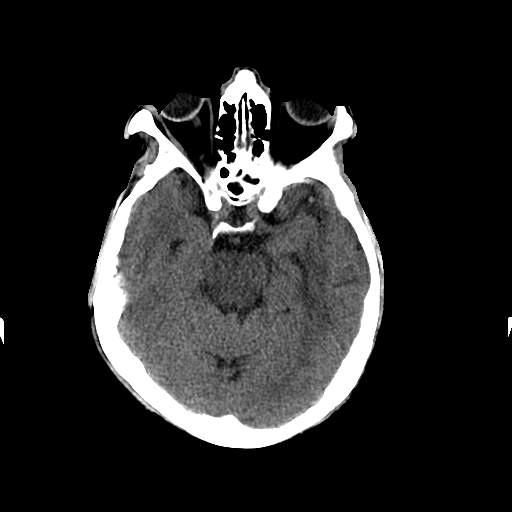
[im 12/32  bone]
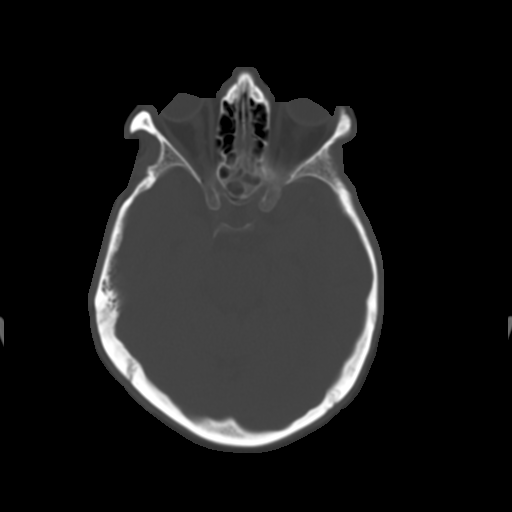
[im 14/32  brain]
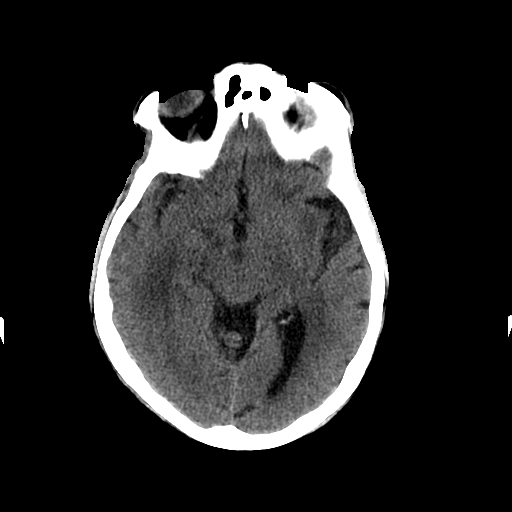
[im 16/32  brain]
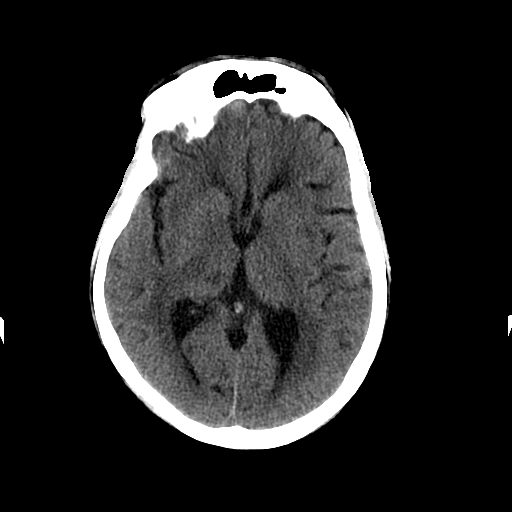
[im 18/32  brain]
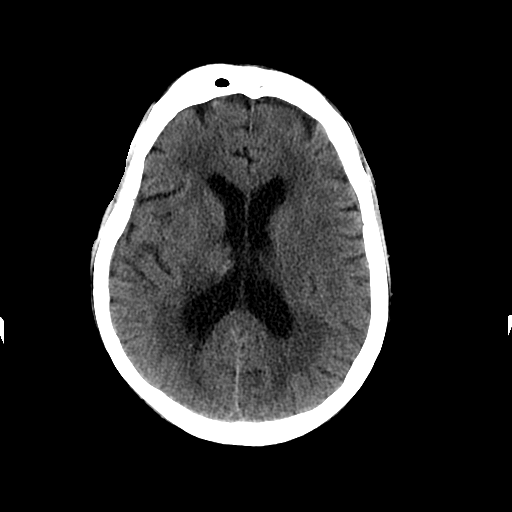
[im 20/32  brain]
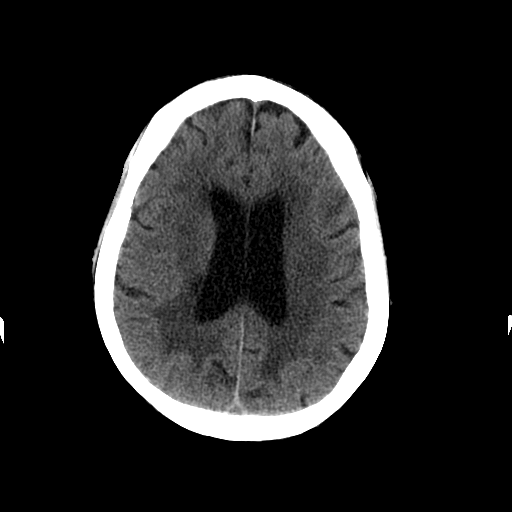
[im 20/32  bone]
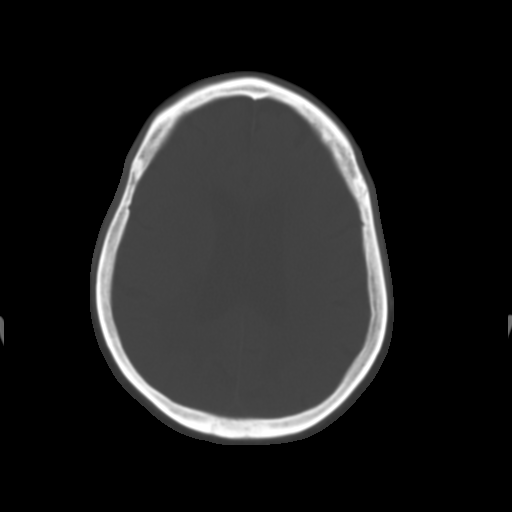
[im 23/32  brain]
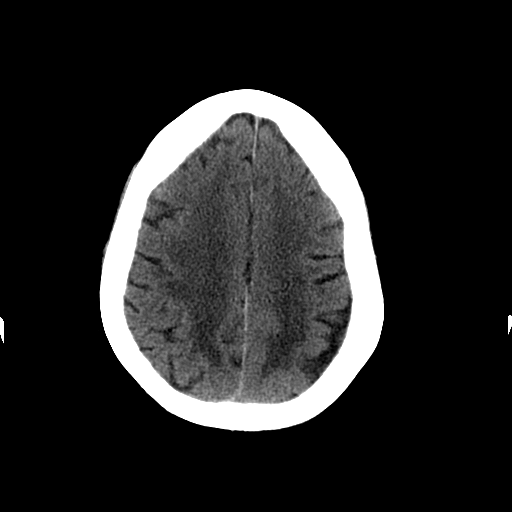
[im 25/32  brain]
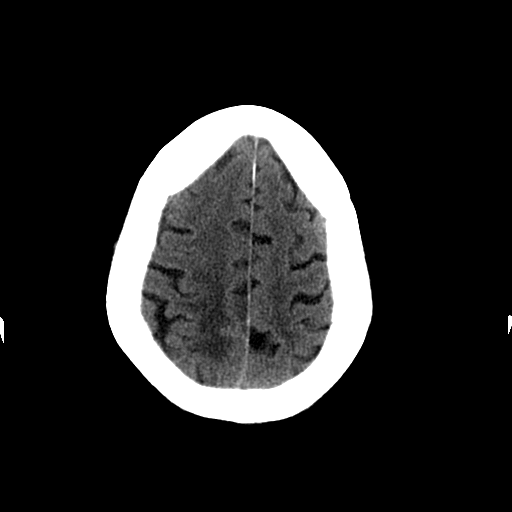
[im 27/32  brain]
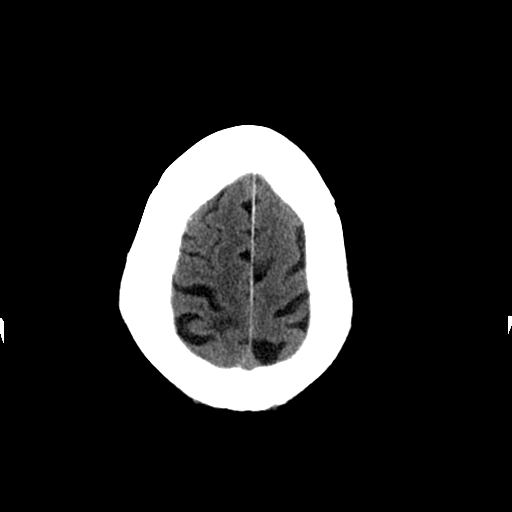
[im 29/32  brain]
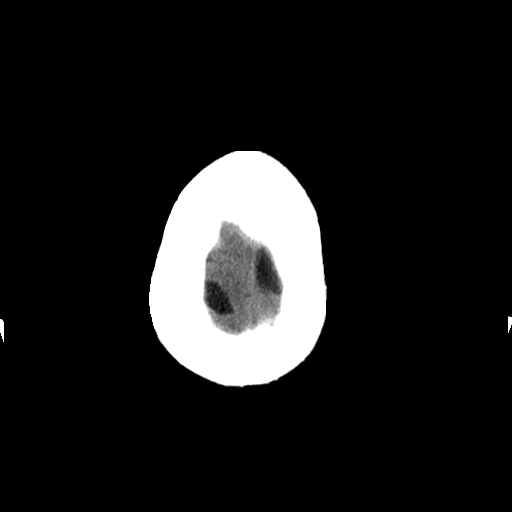
[im 29/32  bone]
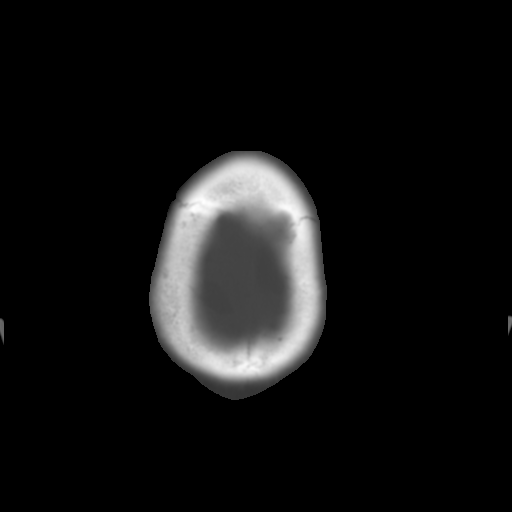

[Series 202: head w/o bone, idose (1) · axial · non-contrast · 0.49mm/px · z∈[+64,+84]mm · 2 of 32 slices shown]
[im 3/32  bone]
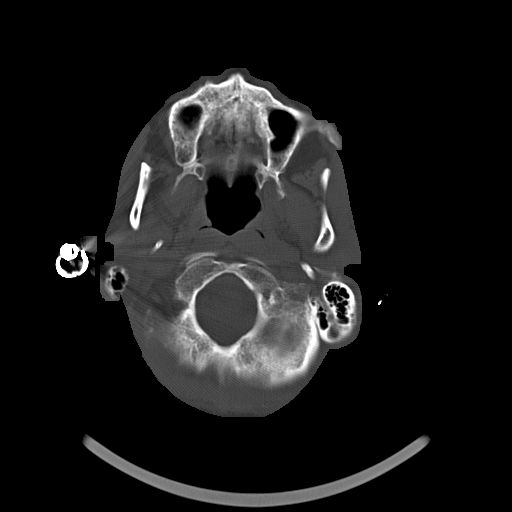
[im 7/32  bone]
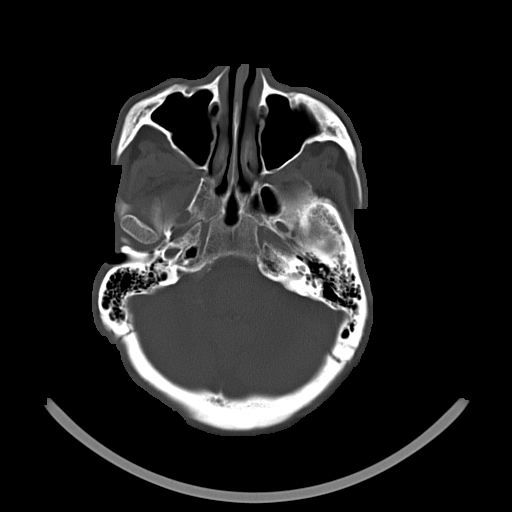

[15 of 30 positions shown; findings below may reference images not displayed]

FINDINGS: There is no evidence of intracranial hemorrhage, brain edema, or
other signs of acute cortical infarction. There is no evidence of
intracranial mass lesion or mass effect. No abnormal extraaxial
fluid collections are identified.

Extensive chronic small vessel disease is demonstrated as well as
mild cerebral atrophy. Somewhat ill low-attenuation areas are also
noted in the right thalamus and basal ganglia, which may represent
subacute or chronic lacunar infarcts. No evidence hydrocephalus. No
skull abnormality identified.
IMPRESSION: Age indeterminate right thalamic and basal ganglia lacunar infarcts,
which may be subacute or chronic in age. Consider brain MRI for
further evaluation if clinically warranted.

Mild cerebral atrophy and extensive chronic small vessel disease.

## 2017-01-12 IMAGING — CR DG CHEST 1V PORT
1 series · 1 of 1 positions shown · non-contrast
Comparison: 03/05/2015

CLINICAL DATA: Acute respiratory failure

EXAM:
PORTABLE CHEST - 1 VIEW

[AP]
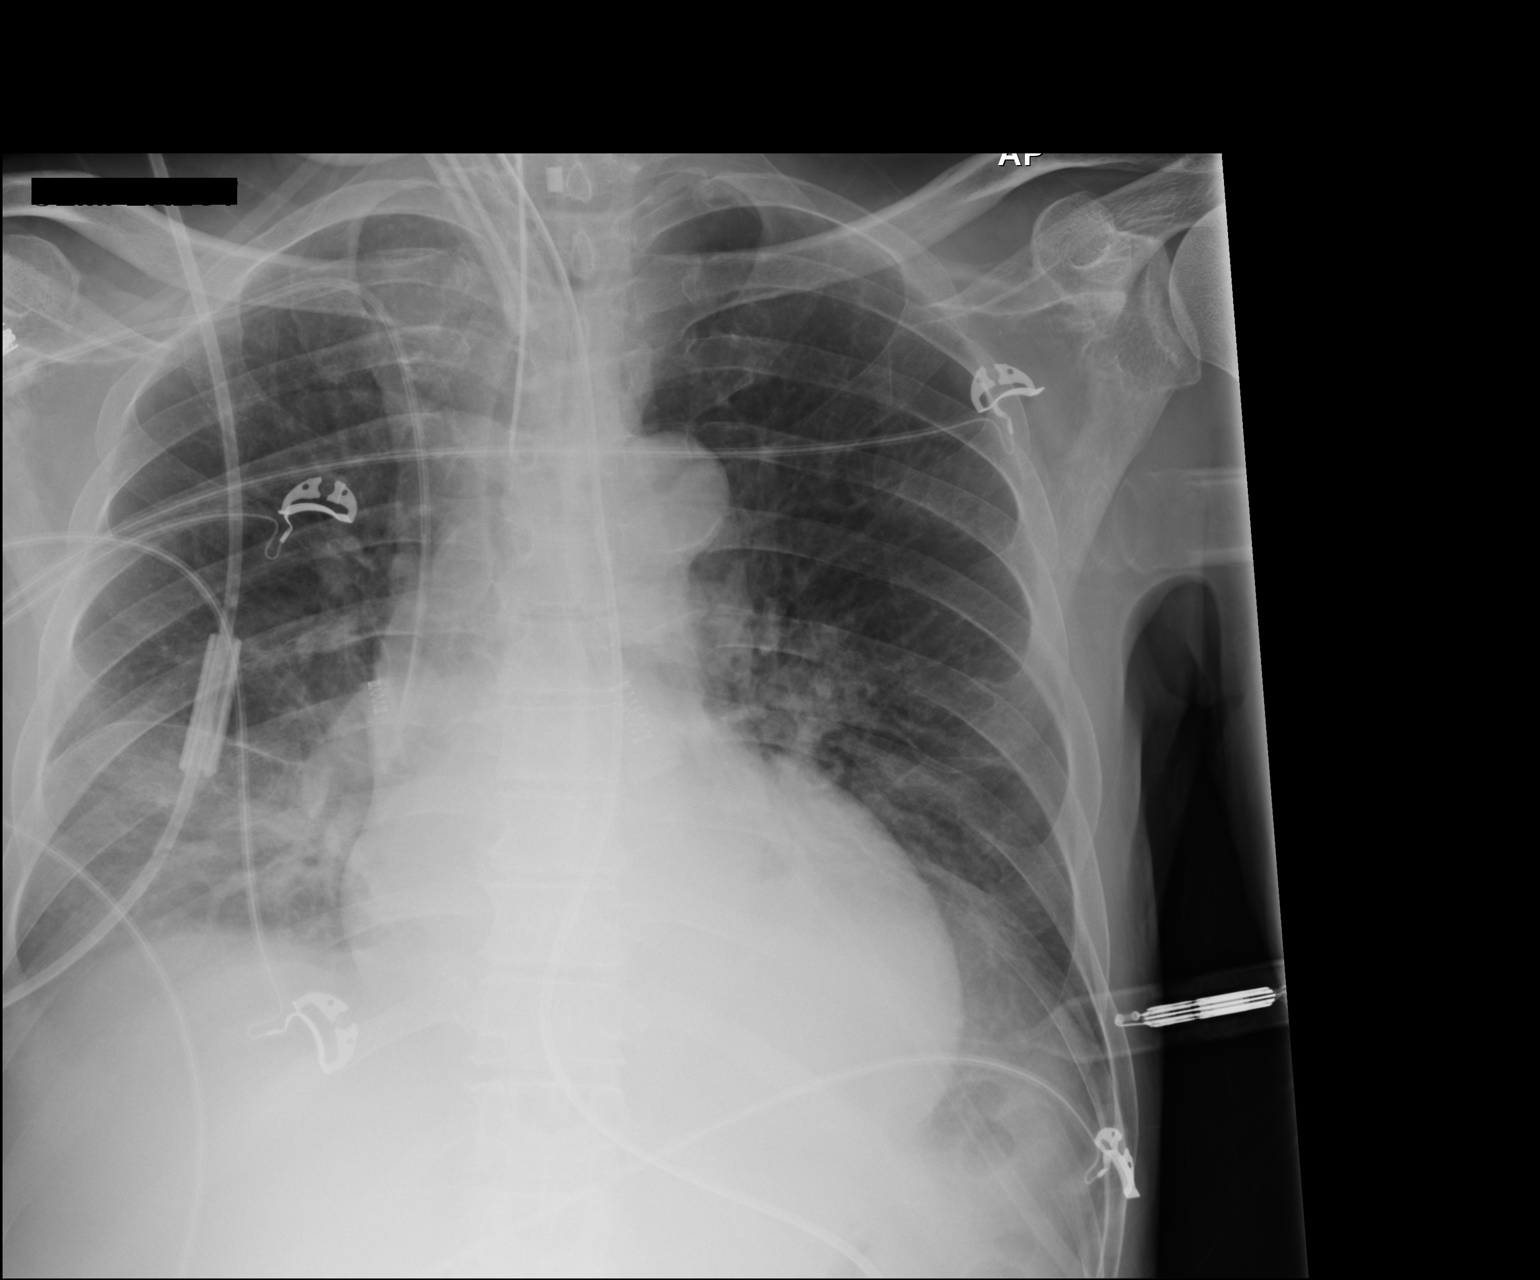

[1 of 1 positions shown; findings below may reference images not displayed]

FINDINGS: Endotracheal tube in good position. NG tube enters the stomach.
Right subclavian catheter tip in the SVC. No pneumothorax

Improved lung volume. Improvement in bibasilar airspace disease
compared with the prior study. Small pleural effusions. Cardiac
enlargement with improvement in vascular congestion.
IMPRESSION: Improved lung volume and improved aeration in the lung bases.
Decreased vascular congestion. Support lines in good position.

## 2017-01-15 IMAGING — CR DG CHEST 1V PORT
1 series · 1 of 1 positions shown · non-contrast
Comparison: March 07, 2015

CLINICAL DATA: Tracheostomy placement

EXAM:
PORTABLE CHEST - 1 VIEW

[AP]
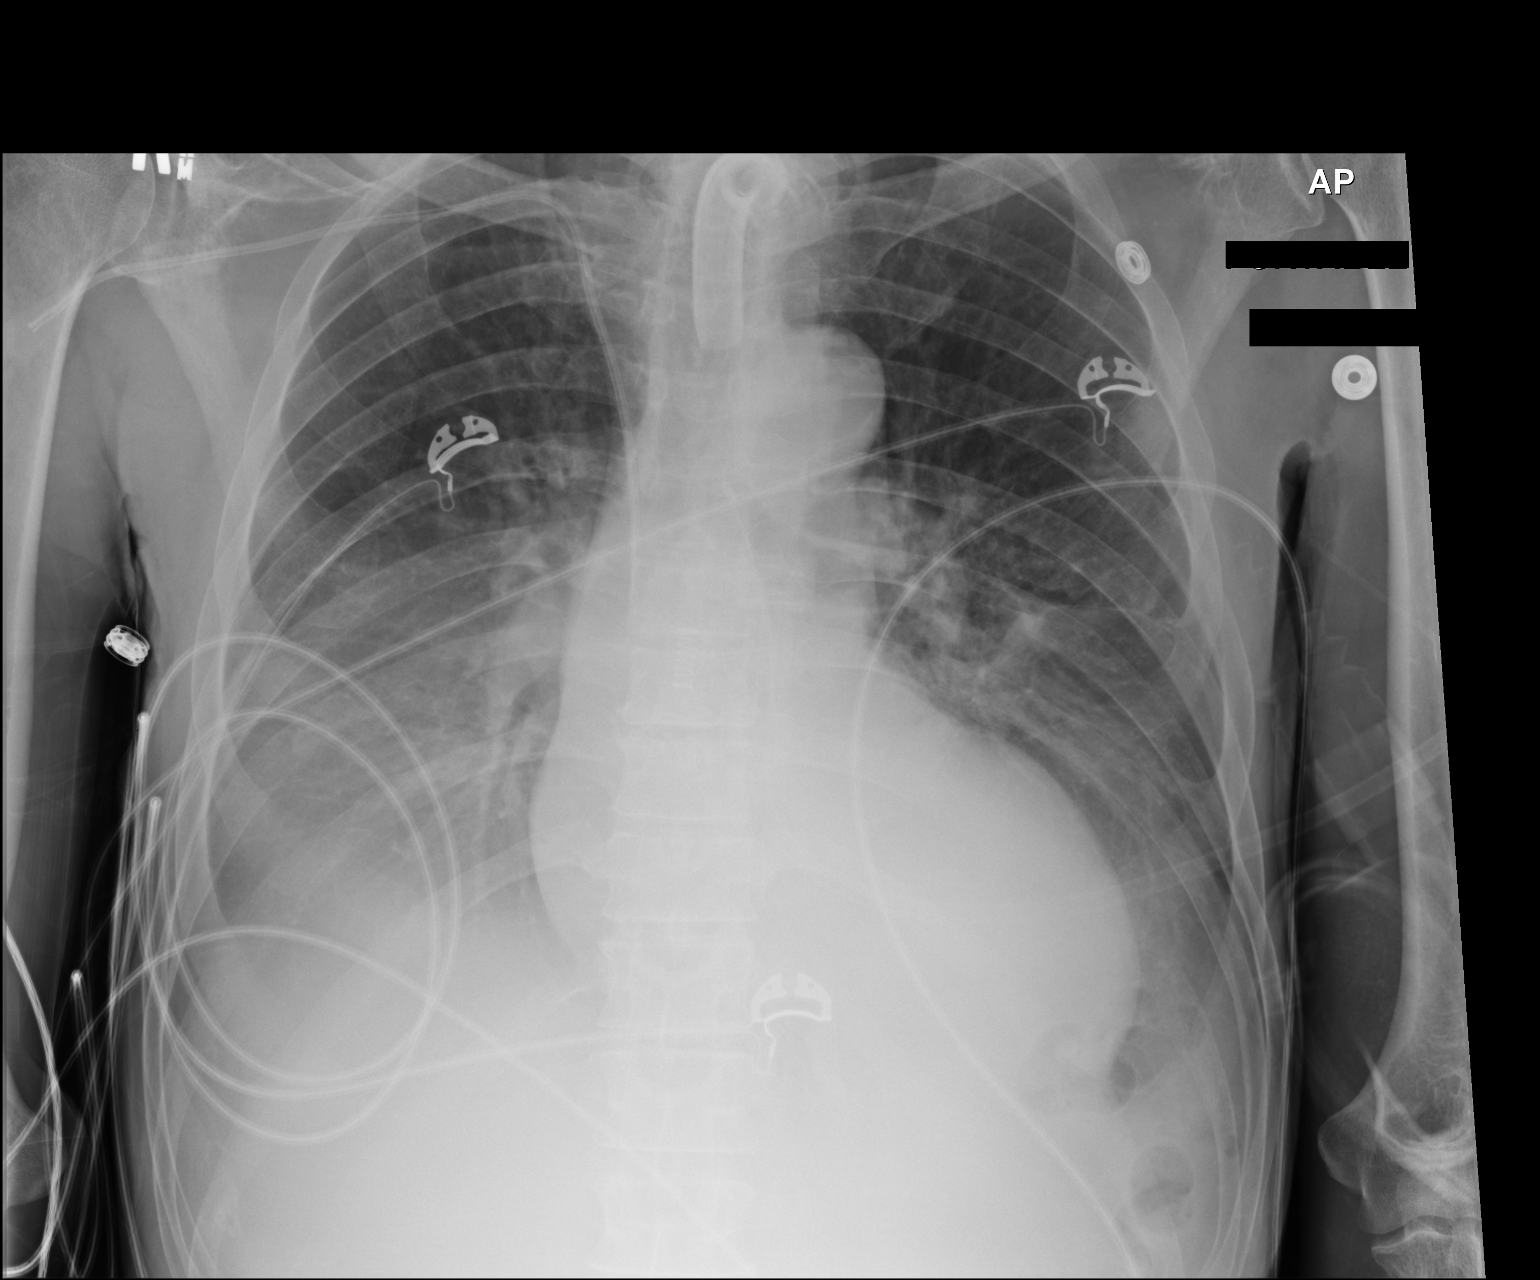

[1 of 1 positions shown; findings below may reference images not displayed]

FINDINGS: Tracheostomy tube tip is 4.3 cm above the carina. Port-A-Cath tip is
in the superior vena cava. Nasogastric tube is no longer present. No
pneumothorax.

There is persistent consolidation in the left lower lobe. There are
bilateral effusions. Heart is enlarged with pulmonary vascularity
within normal limits.
IMPRESSION: Tube and catheter positions as described without pneumothorax.
Persistent bilateral pleural effusions. Persistent airspace
consolidation left lower lobe. Stable cardiomegaly. Suspect a degree
of underlying congestive heart failure.

## 2017-01-20 IMAGING — CR DG CHEST 1V PORT
1 series · 1 of 1 positions shown · non-contrast
Comparison: Portable chest x-ray March 09, 2015

CLINICAL DATA: Follow-up of atelectasis, history of acute and
chronic renal insufficiency, respiratory failure and hypoxia,
subdural hematoma.

EXAM:
PORTABLE CHEST - 1 VIEW

[AP]
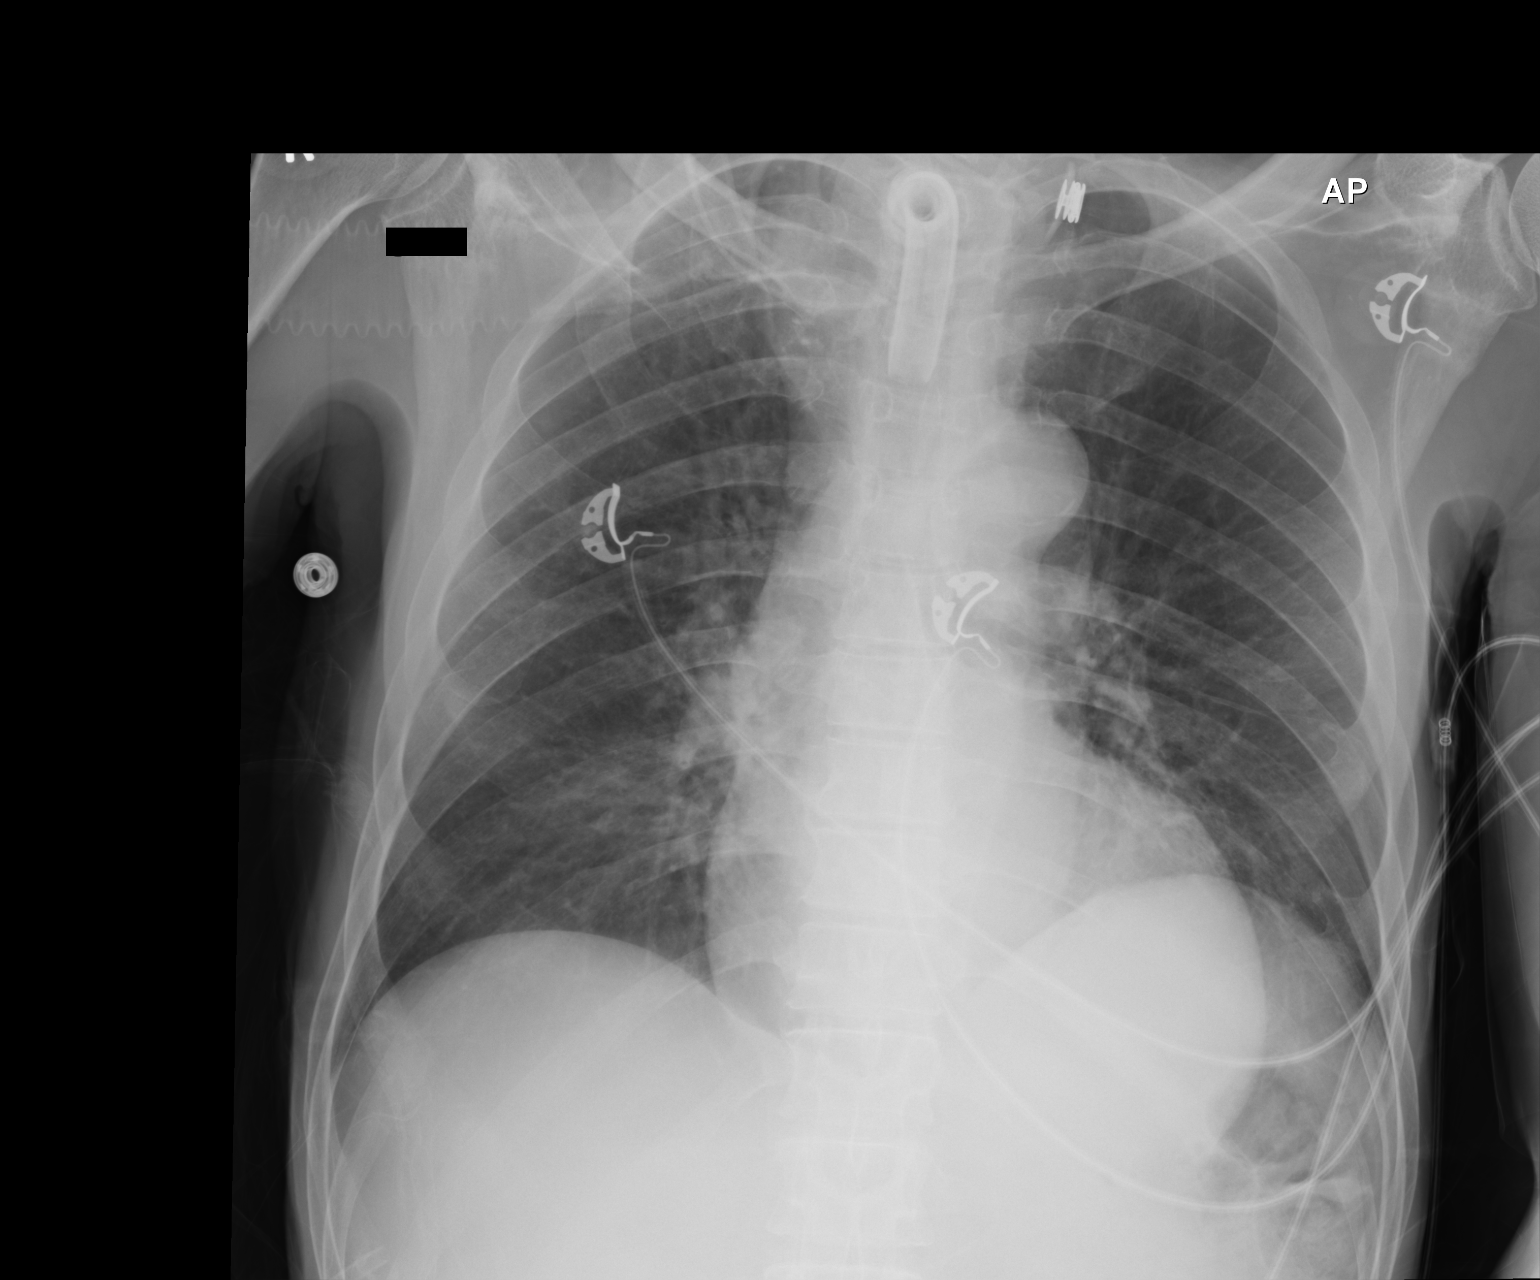

[1 of 1 positions shown; findings below may reference images not displayed]

FINDINGS: The lungs are adequately inflated. Previously demonstrated pleural
effusions are no longer evident. There is no pneumothorax or
alveolar infiltrate. The heart and pulmonary vascularity are normal.
The endotracheal tube tip lies approximately 1 cm below the inferior
margin of the clavicular heads. The bony structures are
unremarkable.
IMPRESSION: Improved appearance of both lungs. No acute cardiopulmonary
abnormality is observed.
